# Patient Record
Sex: Male | Born: 1950
Health system: Southern US, Community
[De-identification: ages and names within clinical notes are randomized; demographics above are authoritative.]

## PROBLEM LIST (undated history)

## (undated) DIAGNOSIS — M1990 Unspecified osteoarthritis, unspecified site: Secondary | ICD-10-CM

## (undated) DIAGNOSIS — I739 Peripheral vascular disease, unspecified: Secondary | ICD-10-CM

## (undated) DIAGNOSIS — R569 Unspecified convulsions: Secondary | ICD-10-CM

## (undated) DIAGNOSIS — I10 Essential (primary) hypertension: Secondary | ICD-10-CM

## (undated) DIAGNOSIS — B1920 Unspecified viral hepatitis C without hepatic coma: Secondary | ICD-10-CM

## (undated) DIAGNOSIS — E7800 Pure hypercholesterolemia, unspecified: Secondary | ICD-10-CM

## (undated) HISTORY — DX: Unspecified viral hepatitis C without hepatic coma: B19.20

## (undated) HISTORY — DX: Peripheral vascular disease, unspecified: I73.9

---

## 1997-12-01 ENCOUNTER — Emergency Department (HOSPITAL_COMMUNITY): Admission: EM | Admit: 1997-12-01 | Discharge: 1997-12-01 | Payer: Self-pay | Admitting: Emergency Medicine

## 1997-12-10 ENCOUNTER — Emergency Department (HOSPITAL_COMMUNITY): Admission: EM | Admit: 1997-12-10 | Discharge: 1997-12-10 | Payer: Self-pay | Admitting: Emergency Medicine

## 1998-05-07 ENCOUNTER — Emergency Department (HOSPITAL_COMMUNITY): Admission: EM | Admit: 1998-05-07 | Discharge: 1998-05-07 | Payer: Self-pay | Admitting: Emergency Medicine

## 1998-09-05 ENCOUNTER — Encounter: Payer: Self-pay | Admitting: Emergency Medicine

## 1998-09-05 ENCOUNTER — Emergency Department (HOSPITAL_COMMUNITY): Admission: EM | Admit: 1998-09-05 | Discharge: 1998-09-05 | Payer: Self-pay | Admitting: Emergency Medicine

## 1998-09-08 ENCOUNTER — Encounter: Payer: Self-pay | Admitting: Emergency Medicine

## 1998-09-08 ENCOUNTER — Emergency Department (HOSPITAL_COMMUNITY): Admission: EM | Admit: 1998-09-08 | Discharge: 1998-09-08 | Payer: Self-pay | Admitting: Emergency Medicine

## 1998-09-17 ENCOUNTER — Emergency Department (HOSPITAL_COMMUNITY): Admission: EM | Admit: 1998-09-17 | Discharge: 1998-09-17 | Payer: Self-pay | Admitting: Emergency Medicine

## 1998-09-20 ENCOUNTER — Emergency Department (HOSPITAL_COMMUNITY): Admission: EM | Admit: 1998-09-20 | Discharge: 1998-09-20 | Payer: Self-pay | Admitting: Emergency Medicine

## 1999-01-06 ENCOUNTER — Emergency Department (HOSPITAL_COMMUNITY): Admission: EM | Admit: 1999-01-06 | Discharge: 1999-01-06 | Payer: Self-pay | Admitting: Emergency Medicine

## 1999-12-08 ENCOUNTER — Inpatient Hospital Stay (HOSPITAL_COMMUNITY): Admission: EM | Admit: 1999-12-08 | Discharge: 1999-12-12 | Payer: Self-pay | Admitting: Emergency Medicine

## 1999-12-08 ENCOUNTER — Encounter: Payer: Self-pay | Admitting: Emergency Medicine

## 2000-01-12 ENCOUNTER — Encounter: Payer: Self-pay | Admitting: Emergency Medicine

## 2000-01-12 ENCOUNTER — Observation Stay (HOSPITAL_COMMUNITY): Admission: EM | Admit: 2000-01-12 | Discharge: 2000-01-13 | Payer: Self-pay | Admitting: Emergency Medicine

## 2000-04-12 ENCOUNTER — Emergency Department (HOSPITAL_COMMUNITY): Admission: EM | Admit: 2000-04-12 | Discharge: 2000-04-12 | Payer: Self-pay | Admitting: Emergency Medicine

## 2000-10-20 ENCOUNTER — Emergency Department (HOSPITAL_COMMUNITY): Admission: EM | Admit: 2000-10-20 | Discharge: 2000-10-21 | Payer: Self-pay

## 2001-02-15 ENCOUNTER — Emergency Department (HOSPITAL_COMMUNITY): Admission: EM | Admit: 2001-02-15 | Discharge: 2001-02-15 | Payer: Self-pay

## 2001-09-27 ENCOUNTER — Emergency Department (HOSPITAL_COMMUNITY): Admission: EM | Admit: 2001-09-27 | Discharge: 2001-09-27 | Payer: Self-pay | Admitting: Emergency Medicine

## 2001-12-08 ENCOUNTER — Encounter: Payer: Self-pay | Admitting: Family Medicine

## 2001-12-08 ENCOUNTER — Ambulatory Visit (HOSPITAL_COMMUNITY): Admission: RE | Admit: 2001-12-08 | Discharge: 2001-12-08 | Payer: Self-pay | Admitting: Family Medicine

## 2002-01-04 ENCOUNTER — Encounter: Payer: Self-pay | Admitting: Internal Medicine

## 2002-01-04 ENCOUNTER — Ambulatory Visit (HOSPITAL_COMMUNITY): Admission: RE | Admit: 2002-01-04 | Discharge: 2002-01-04 | Payer: Self-pay | Admitting: Internal Medicine

## 2002-08-03 ENCOUNTER — Ambulatory Visit (HOSPITAL_COMMUNITY): Admission: RE | Admit: 2002-08-03 | Discharge: 2002-08-03 | Payer: Self-pay | Admitting: Internal Medicine

## 2002-08-03 ENCOUNTER — Encounter: Payer: Self-pay | Admitting: Internal Medicine

## 2003-04-19 ENCOUNTER — Ambulatory Visit: Admission: RE | Admit: 2003-04-19 | Discharge: 2003-04-19 | Payer: Self-pay

## 2003-12-16 ENCOUNTER — Ambulatory Visit: Payer: Self-pay | Admitting: *Deleted

## 2004-01-06 ENCOUNTER — Ambulatory Visit: Payer: Self-pay | Admitting: Family Medicine

## 2004-04-04 ENCOUNTER — Ambulatory Visit: Payer: Self-pay | Admitting: Family Medicine

## 2004-04-12 ENCOUNTER — Ambulatory Visit: Payer: Self-pay | Admitting: Family Medicine

## 2004-05-07 ENCOUNTER — Ambulatory Visit: Payer: Self-pay | Admitting: Family Medicine

## 2004-07-05 ENCOUNTER — Ambulatory Visit: Payer: Self-pay | Admitting: Internal Medicine

## 2004-07-17 ENCOUNTER — Ambulatory Visit: Payer: Self-pay | Admitting: Family Medicine

## 2004-08-31 ENCOUNTER — Ambulatory Visit: Payer: Self-pay | Admitting: Family Medicine

## 2004-10-08 ENCOUNTER — Ambulatory Visit: Payer: Self-pay | Admitting: Internal Medicine

## 2004-10-15 ENCOUNTER — Ambulatory Visit: Payer: Self-pay | Admitting: Internal Medicine

## 2004-10-29 ENCOUNTER — Ambulatory Visit: Payer: Self-pay | Admitting: Internal Medicine

## 2004-11-02 ENCOUNTER — Ambulatory Visit: Payer: Self-pay | Admitting: Family Medicine

## 2004-12-21 ENCOUNTER — Ambulatory Visit: Payer: Self-pay | Admitting: Family Medicine

## 2005-01-10 ENCOUNTER — Ambulatory Visit: Payer: Self-pay | Admitting: Family Medicine

## 2005-01-21 ENCOUNTER — Ambulatory Visit: Payer: Self-pay | Admitting: Family Medicine

## 2005-02-05 ENCOUNTER — Ambulatory Visit: Payer: Self-pay | Admitting: Family Medicine

## 2005-02-18 ENCOUNTER — Ambulatory Visit (HOSPITAL_COMMUNITY): Admission: RE | Admit: 2005-02-18 | Discharge: 2005-02-18 | Payer: Self-pay | Admitting: Vascular Surgery

## 2005-02-21 ENCOUNTER — Ambulatory Visit: Payer: Self-pay | Admitting: Family Medicine

## 2005-10-08 ENCOUNTER — Ambulatory Visit: Payer: Self-pay | Admitting: Family Medicine

## 2005-11-26 ENCOUNTER — Ambulatory Visit: Payer: Self-pay | Admitting: Family Medicine

## 2005-12-24 ENCOUNTER — Ambulatory Visit: Payer: Self-pay | Admitting: Family Medicine

## 2006-01-30 ENCOUNTER — Ambulatory Visit: Payer: Self-pay | Admitting: Family Medicine

## 2006-02-10 ENCOUNTER — Ambulatory Visit: Payer: Self-pay | Admitting: Family Medicine

## 2006-02-24 ENCOUNTER — Ambulatory Visit: Payer: Self-pay | Admitting: Family Medicine

## 2006-02-26 ENCOUNTER — Ambulatory Visit: Payer: Self-pay | Admitting: Family Medicine

## 2006-03-06 ENCOUNTER — Ambulatory Visit: Payer: Self-pay | Admitting: Family Medicine

## 2006-04-07 ENCOUNTER — Ambulatory Visit: Payer: Self-pay | Admitting: Family Medicine

## 2006-04-30 ENCOUNTER — Ambulatory Visit: Payer: Self-pay | Admitting: Family Medicine

## 2006-04-30 ENCOUNTER — Emergency Department (HOSPITAL_COMMUNITY): Admission: EM | Admit: 2006-04-30 | Discharge: 2006-04-30 | Payer: Self-pay | Admitting: Emergency Medicine

## 2006-05-05 ENCOUNTER — Ambulatory Visit: Payer: Self-pay | Admitting: Cardiology

## 2006-09-09 ENCOUNTER — Ambulatory Visit: Payer: Self-pay | Admitting: Internal Medicine

## 2006-09-10 DIAGNOSIS — F329 Major depressive disorder, single episode, unspecified: Secondary | ICD-10-CM | POA: Insufficient documentation

## 2006-09-10 DIAGNOSIS — I739 Peripheral vascular disease, unspecified: Secondary | ICD-10-CM | POA: Insufficient documentation

## 2006-09-10 DIAGNOSIS — R569 Unspecified convulsions: Secondary | ICD-10-CM | POA: Insufficient documentation

## 2006-09-10 DIAGNOSIS — I10 Essential (primary) hypertension: Secondary | ICD-10-CM | POA: Insufficient documentation

## 2006-12-16 ENCOUNTER — Ambulatory Visit: Payer: Self-pay | Admitting: Vascular Surgery

## 2007-02-20 ENCOUNTER — Ambulatory Visit: Payer: Self-pay | Admitting: Internal Medicine

## 2007-02-20 LAB — CONVERTED CEMR LAB
ALT: 13 units/L (ref 0–53)
AST: 18 units/L (ref 0–37)
Albumin: 4.3 g/dL (ref 3.5–5.2)
Alkaline Phosphatase: 118 units/L — ABNORMAL HIGH (ref 39–117)
BUN: 11 mg/dL (ref 6–23)
Basophils Absolute: 0 10*3/uL (ref 0.0–0.1)
Basophils Relative: 1 % (ref 0–1)
CO2: 26 meq/L (ref 19–32)
Calcium: 9.4 mg/dL (ref 8.4–10.5)
Chloride: 103 meq/L (ref 96–112)
Cholesterol: 189 mg/dL (ref 0–200)
Creatinine, Ser: 0.92 mg/dL (ref 0.40–1.50)
Eosinophils Absolute: 0.2 10*3/uL (ref 0.2–0.7)
Eosinophils Relative: 3 % (ref 0–5)
Glucose, Bld: 82 mg/dL (ref 70–99)
HCT: 45.1 % (ref 39.0–52.0)
HDL: 67 mg/dL (ref >39)
Hemoglobin: 15.2 g/dL (ref 13.0–17.0)
LDL Cholesterol: 110 mg/dL — ABNORMAL HIGH (ref 0–99)
Lymphocytes Relative: 46 % (ref 12–46)
Lymphs Abs: 2.7 10*3/uL (ref 0.7–4.0)
MCHC: 33.7 g/dL (ref 30.0–36.0)
MCV: 94.7 fL (ref 78.0–100.0)
Monocytes Absolute: 0.4 10*3/uL (ref 0.1–1.0)
Monocytes Relative: 7 % (ref 3–12)
Neutro Abs: 2.7 10*3/uL (ref 1.7–7.7)
Neutrophils Relative %: 45 % (ref 43–77)
Phenytoin Lvl: 18 ug/mL (ref 10.0–20.0)
Platelets: 211 10*3/uL (ref 150–400)
Potassium: 4.2 meq/L (ref 3.5–5.3)
RBC: 4.76 M/uL (ref 4.22–5.81)
RDW: 13.8 % (ref 11.5–15.5)
Sodium: 139 meq/L (ref 135–145)
Total Bilirubin: 0.5 mg/dL (ref 0.3–1.2)
Total CHOL/HDL Ratio: 2.8
Total Protein: 7.4 g/dL (ref 6.0–8.3)
Triglycerides: 58 mg/dL (ref <150)
VLDL: 12 mg/dL (ref 0–40)
WBC: 6 10*3/uL (ref 4.0–10.5)

## 2007-05-11 ENCOUNTER — Ambulatory Visit: Payer: Self-pay | Admitting: Internal Medicine

## 2007-05-11 ENCOUNTER — Encounter (INDEPENDENT_AMBULATORY_CARE_PROVIDER_SITE_OTHER): Payer: Self-pay | Admitting: Family Medicine

## 2007-05-11 LAB — CONVERTED CEMR LAB
ALT: 13 units/L (ref 0–53)
AST: 16 units/L (ref 0–37)
Albumin: 4.3 g/dL (ref 3.5–5.2)
Alkaline Phosphatase: 127 units/L — ABNORMAL HIGH (ref 39–117)
BUN: 9 mg/dL (ref 6–23)
CO2: 25 meq/L (ref 19–32)
Calcium: 9.2 mg/dL (ref 8.4–10.5)
Chloride: 105 meq/L (ref 96–112)
Creatinine, Ser: 0.9 mg/dL (ref 0.40–1.50)
Glucose, Bld: 75 mg/dL (ref 70–99)
Phenytoin Lvl: 11.6 ug/mL (ref 10.0–20.0)
Potassium: 4 meq/L (ref 3.5–5.3)
Sodium: 138 meq/L (ref 135–145)
Total Bilirubin: 0.4 mg/dL (ref 0.3–1.2)
Total Protein: 7.2 g/dL (ref 6.0–8.3)

## 2007-09-21 ENCOUNTER — Ambulatory Visit: Payer: Self-pay | Admitting: Internal Medicine

## 2007-11-25 ENCOUNTER — Ambulatory Visit: Payer: Self-pay | Admitting: Internal Medicine

## 2007-11-25 ENCOUNTER — Encounter (INDEPENDENT_AMBULATORY_CARE_PROVIDER_SITE_OTHER): Payer: Self-pay | Admitting: Family Medicine

## 2007-11-25 LAB — CONVERTED CEMR LAB
ALT: 16 units/L (ref 0–53)
AST: 15 units/L (ref 0–37)
Albumin: 4 g/dL (ref 3.5–5.2)
Alkaline Phosphatase: 76 units/L (ref 39–117)
BUN: 13 mg/dL (ref 6–23)
Basophils Absolute: 0 10*3/uL (ref 0.0–0.1)
Basophils Relative: 1 % (ref 0–1)
CO2: 27 meq/L (ref 19–32)
Calcium: 8.6 mg/dL (ref 8.4–10.5)
Chloride: 104 meq/L (ref 96–112)
Cholesterol: 158 mg/dL (ref 0–200)
Creatinine, Ser: 1.03 mg/dL (ref 0.40–1.50)
Eosinophils Absolute: 0.2 10*3/uL (ref 0.0–0.7)
Eosinophils Relative: 3 % (ref 0–5)
Glucose, Bld: 99 mg/dL (ref 70–99)
HCT: 43.4 % (ref 39.0–52.0)
HDL: 60 mg/dL (ref >39)
Hemoglobin: 14.5 g/dL (ref 13.0–17.0)
LDL Cholesterol: 87 mg/dL (ref 0–99)
Lymphocytes Relative: 44 % (ref 12–46)
Lymphs Abs: 2.9 10*3/uL (ref 0.7–4.0)
MCHC: 33.4 g/dL (ref 30.0–36.0)
MCV: 95.4 fL (ref 78.0–100.0)
Monocytes Absolute: 0.5 10*3/uL (ref 0.1–1.0)
Monocytes Relative: 7 % (ref 3–12)
Neutro Abs: 3.1 10*3/uL (ref 1.7–7.7)
Neutrophils Relative %: 47 % (ref 43–77)
PSA: 0.2 ng/mL (ref 0.10–4.00)
Phenytoin Lvl: 19.8 ug/mL (ref 10.0–20.0)
Platelets: 213 10*3/uL (ref 150–400)
Potassium: 4.6 meq/L (ref 3.5–5.3)
RBC: 4.55 M/uL (ref 4.22–5.81)
RDW: 14 % (ref 11.5–15.5)
Sodium: 140 meq/L (ref 135–145)
Total Bilirubin: 0.3 mg/dL (ref 0.3–1.2)
Total CHOL/HDL Ratio: 2.6
Total Protein: 6.7 g/dL (ref 6.0–8.3)
Triglycerides: 54 mg/dL (ref <150)
VLDL: 11 mg/dL (ref 0–40)
WBC: 6.7 10*3/uL (ref 4.0–10.5)

## 2008-02-22 ENCOUNTER — Ambulatory Visit: Payer: Self-pay | Admitting: Internal Medicine

## 2008-10-25 ENCOUNTER — Ambulatory Visit: Payer: Self-pay | Admitting: Internal Medicine

## 2008-10-25 ENCOUNTER — Encounter (INDEPENDENT_AMBULATORY_CARE_PROVIDER_SITE_OTHER): Payer: Self-pay | Admitting: Adult Health

## 2008-10-25 LAB — CONVERTED CEMR LAB
ALT: 21 units/L (ref 0–53)
AST: 20 units/L (ref 0–37)
Albumin: 4.2 g/dL (ref 3.5–5.2)
Alkaline Phosphatase: 84 units/L (ref 39–117)
BUN: 11 mg/dL (ref 6–23)
Basophils Absolute: 0.1 10*3/uL (ref 0.0–0.1)
Basophils Relative: 1 % (ref 0–1)
CO2: 26 meq/L (ref 19–32)
Calcium: 9 mg/dL (ref 8.4–10.5)
Chloride: 102 meq/L (ref 96–112)
Creatinine, Ser: 0.98 mg/dL (ref 0.40–1.50)
Eosinophils Absolute: 0.2 10*3/uL (ref 0.0–0.7)
Eosinophils Relative: 2 % (ref 0–5)
Glucose, Bld: 96 mg/dL (ref 70–99)
HCT: 42.6 % (ref 39.0–52.0)
Hemoglobin: 14.7 g/dL (ref 13.0–17.0)
Lymphocytes Relative: 43 % (ref 12–46)
Lymphs Abs: 3.5 10*3/uL (ref 0.7–4.0)
MCHC: 34.5 g/dL (ref 30.0–36.0)
MCV: 92.8 fL (ref 78.0–100.0)
Microalb, Ur: 0.57 mg/dL (ref 0.00–1.89)
Monocytes Absolute: 0.6 10*3/uL (ref 0.1–1.0)
Monocytes Relative: 7 % (ref 3–12)
Neutro Abs: 3.8 10*3/uL (ref 1.7–7.7)
Neutrophils Relative %: 48 % (ref 43–77)
Phenytoin Lvl: 18.2 ug/mL (ref 10.0–20.0)
Platelets: 202 10*3/uL (ref 150–400)
Potassium: 4.2 meq/L (ref 3.5–5.3)
RBC: 4.59 M/uL (ref 4.22–5.81)
RDW: 13.6 % (ref 11.5–15.5)
Sodium: 139 meq/L (ref 135–145)
Total Bilirubin: 0.5 mg/dL (ref 0.3–1.2)
Total Protein: 7 g/dL (ref 6.0–8.3)
WBC: 8.1 10*3/uL (ref 4.0–10.5)

## 2009-04-24 ENCOUNTER — Ambulatory Visit: Payer: Self-pay | Admitting: Internal Medicine

## 2009-04-24 ENCOUNTER — Encounter (INDEPENDENT_AMBULATORY_CARE_PROVIDER_SITE_OTHER): Payer: Self-pay | Admitting: Adult Health

## 2009-04-24 LAB — CONVERTED CEMR LAB
ALT: 20 units/L (ref 0–53)
AST: 25 units/L (ref 0–37)
Albumin: 4 g/dL (ref 3.5–5.2)
Alkaline Phosphatase: 74 units/L (ref 39–117)
BUN: 10 mg/dL (ref 6–23)
Basophils Absolute: 0 10*3/uL (ref 0.0–0.1)
Basophils Relative: 0 % (ref 0–1)
CO2: 18 meq/L — ABNORMAL LOW (ref 19–32)
Calcium: 9.4 mg/dL (ref 8.4–10.5)
Chloride: 103 meq/L (ref 96–112)
Creatinine, Ser: 0.92 mg/dL (ref 0.40–1.50)
Eosinophils Absolute: 0.1 10*3/uL (ref 0.0–0.7)
Eosinophils Relative: 2 % (ref 0–5)
Glucose, Bld: 91 mg/dL (ref 70–99)
HCT: 42.7 % (ref 39.0–52.0)
Hemoglobin: 14.7 g/dL (ref 13.0–17.0)
Lymphocytes Relative: 40 % (ref 12–46)
Lymphs Abs: 2.9 10*3/uL (ref 0.7–4.0)
MCHC: 34.4 g/dL (ref 30.0–36.0)
MCV: 93 fL (ref 78.0–100.0)
Monocytes Absolute: 0.5 10*3/uL (ref 0.1–1.0)
Monocytes Relative: 7 % (ref 3–12)
Neutro Abs: 3.6 10*3/uL (ref 1.7–7.7)
Neutrophils Relative %: 51 % (ref 43–77)
Phenytoin Lvl: 16.6 ug/mL (ref 10.0–20.0)
Platelets: 228 10*3/uL (ref 150–400)
Potassium: 4.1 meq/L (ref 3.5–5.3)
RBC: 4.59 M/uL (ref 4.22–5.81)
RDW: 13.5 % (ref 11.5–15.5)
Sodium: 135 meq/L (ref 135–145)
Total Bilirubin: 0.5 mg/dL (ref 0.3–1.2)
Total Protein: 6.9 g/dL (ref 6.0–8.3)
WBC: 7.1 10*3/uL (ref 4.0–10.5)

## 2009-07-11 ENCOUNTER — Emergency Department (HOSPITAL_COMMUNITY): Admission: EM | Admit: 2009-07-11 | Discharge: 2009-07-12 | Payer: Self-pay | Admitting: Emergency Medicine

## 2009-07-21 ENCOUNTER — Ambulatory Visit: Payer: Self-pay | Admitting: Internal Medicine

## 2009-07-21 ENCOUNTER — Encounter (INDEPENDENT_AMBULATORY_CARE_PROVIDER_SITE_OTHER): Payer: Self-pay | Admitting: Adult Health

## 2009-07-21 LAB — CONVERTED CEMR LAB
ALT: 22 units/L (ref 0–53)
AST: 23 units/L (ref 0–37)
Albumin: 4.1 g/dL (ref 3.5–5.2)
Alkaline Phosphatase: 68 units/L (ref 39–117)
BUN: 13 mg/dL (ref 6–23)
CO2: 22 meq/L (ref 19–32)
Calcium: 9.4 mg/dL (ref 8.4–10.5)
Chloride: 105 meq/L (ref 96–112)
Cholesterol: 163 mg/dL (ref 0–200)
Creatinine, Ser: 1 mg/dL (ref 0.40–1.50)
Glucose, Bld: 107 mg/dL — ABNORMAL HIGH (ref 70–99)
HDL: 67 mg/dL (ref >39)
LDL Cholesterol: 85 mg/dL (ref 0–99)
PSA: 0.63 ng/mL (ref 0.10–4.00)
Potassium: 4.1 meq/L (ref 3.5–5.3)
Sodium: 140 meq/L (ref 135–145)
TSH: 1.129 microintl units/mL (ref 0.350–4.500)
Total Bilirubin: 0.4 mg/dL (ref 0.3–1.2)
Total CHOL/HDL Ratio: 2.4
Total Protein: 6.6 g/dL (ref 6.0–8.3)
Triglycerides: 57 mg/dL (ref <150)
VLDL: 11 mg/dL (ref 0–40)
Vit D, 25-Hydroxy: 57 ng/mL (ref 30–89)
Vitamin B-12: 378 pg/mL (ref 211–911)

## 2010-02-07 ENCOUNTER — Encounter (INDEPENDENT_AMBULATORY_CARE_PROVIDER_SITE_OTHER): Payer: Self-pay | Admitting: *Deleted

## 2010-02-07 LAB — CONVERTED CEMR LAB
ALT: 30 units/L (ref 0–53)
AST: 31 units/L (ref 0–37)
Albumin: 4 g/dL (ref 3.5–5.2)
Alkaline Phosphatase: 84 units/L (ref 39–117)
BUN: 9 mg/dL (ref 6–23)
CO2: 28 meq/L (ref 19–32)
Calcium: 8.9 mg/dL (ref 8.4–10.5)
Chloride: 104 meq/L (ref 96–112)
Creatinine, Ser: 0.83 mg/dL (ref 0.40–1.50)
Glucose, Bld: 89 mg/dL (ref 70–99)
HCT: 44.7 % (ref 39.0–52.0)
Hemoglobin: 15 g/dL (ref 13.0–17.0)
MCHC: 33.6 g/dL (ref 30.0–36.0)
MCV: 93.5 fL (ref 78.0–100.0)
Microalb, Ur: 0.5 mg/dL (ref 0.00–1.89)
Phenytoin Lvl: 14.4 ug/mL (ref 10.0–20.0)
Platelets: 171 10*3/uL (ref 150–400)
Potassium: 4.3 meq/L (ref 3.5–5.3)
RBC: 4.78 M/uL (ref 4.22–5.81)
RDW: 13.3 % (ref 11.5–15.5)
Sodium: 142 meq/L (ref 135–145)
Total Bilirubin: 0.4 mg/dL (ref 0.3–1.2)
Total Protein: 6.8 g/dL (ref 6.0–8.3)
WBC: 5.3 10*3/uL (ref 4.0–10.5)

## 2010-06-05 LAB — URINALYSIS, ROUTINE W REFLEX MICROSCOPIC
Glucose, UA: NEGATIVE mg/dL
Hgb urine dipstick: NEGATIVE
Leukocytes, UA: NEGATIVE
Nitrite: POSITIVE — AB
Protein, ur: NEGATIVE mg/dL
Specific Gravity, Urine: 1.032 — ABNORMAL HIGH (ref 1.005–1.030)
Urobilinogen, UA: 1 mg/dL (ref 0.0–1.0)
pH: 5.5 (ref 5.0–8.0)

## 2010-06-05 LAB — DIFFERENTIAL
Basophils Absolute: 0 10*3/uL (ref 0.0–0.1)
Basophils Relative: 1 % (ref 0–1)
Eosinophils Absolute: 0.2 10*3/uL (ref 0.0–0.7)
Eosinophils Relative: 2 % (ref 0–5)
Lymphocytes Relative: 38 % (ref 12–46)
Lymphs Abs: 3.8 10*3/uL (ref 0.7–4.0)
Monocytes Absolute: 0.9 10*3/uL (ref 0.1–1.0)
Monocytes Relative: 10 % (ref 3–12)
Neutro Abs: 4.9 10*3/uL (ref 1.7–7.7)
Neutrophils Relative %: 50 % (ref 43–77)

## 2010-06-05 LAB — COMPREHENSIVE METABOLIC PANEL
ALT: 27 U/L (ref 0–53)
AST: 33 U/L (ref 0–37)
Albumin: 4.3 g/dL (ref 3.5–5.2)
Alkaline Phosphatase: 69 U/L (ref 39–117)
BUN: 10 mg/dL (ref 6–23)
CO2: 31 mEq/L (ref 19–32)
Calcium: 9 mg/dL (ref 8.4–10.5)
Chloride: 101 mEq/L (ref 96–112)
Creatinine, Ser: 1.04 mg/dL (ref 0.4–1.5)
GFR calc Af Amer: >60 mL/min (ref >60)
GFR calc non Af Amer: >60 mL/min (ref >60)
Glucose, Bld: 109 mg/dL — ABNORMAL HIGH (ref 70–99)
Potassium: 3.6 mEq/L (ref 3.5–5.1)
Sodium: 137 mEq/L (ref 135–145)
Total Bilirubin: 1.3 mg/dL — ABNORMAL HIGH (ref 0.3–1.2)
Total Protein: 7.4 g/dL (ref 6.0–8.3)

## 2010-06-05 LAB — URINE MICROSCOPIC-ADD ON

## 2010-06-05 LAB — CBC
HCT: 41.4 % (ref 39.0–52.0)
Hemoglobin: 13.9 g/dL (ref 13.0–17.0)
MCHC: 33.4 g/dL (ref 30.0–36.0)
MCV: 97.1 fL (ref 78.0–100.0)
Platelets: 190 10*3/uL (ref 150–400)
RBC: 4.27 MIL/uL (ref 4.22–5.81)
RDW: 13.7 % (ref 11.5–15.5)
WBC: 9.9 10*3/uL (ref 4.0–10.5)

## 2010-06-05 LAB — ETHANOL: Alcohol, Ethyl (B): <5 mg/dL (ref 0–10)

## 2010-06-05 LAB — RAPID URINE DRUG SCREEN, HOSP PERFORMED
Amphetamines: NOT DETECTED
Barbiturates: NOT DETECTED
Benzodiazepines: NOT DETECTED
Cocaine: NOT DETECTED
Opiates: NOT DETECTED
Tetrahydrocannabinol: NOT DETECTED

## 2010-06-05 LAB — PHENYTOIN LEVEL, TOTAL: Phenytoin Lvl: 11.2 ug/mL (ref 10.0–20.0)

## 2010-08-03 NOTE — Discharge Summary (Signed)
[place]. [facility]  Patient:    Jeffrey Serrano, Jeffrey Serrano                       MRN: [id number] Adm. Date:  [id number] Disc. Date: [id number] Attending:  Arsenio Loader Dictator:   Duncan Dull, M.D.                           Discharge Summary  NO DICTATION. DD:  12/21/99 TD:  12/23/99 Job: [id number]

## 2010-08-03 NOTE — Discharge Summary (Signed)
[place]. [facility]  Patient:    Jeffrey Serrano, Jeffrey Serrano                       MRN: [id number] Adm. Date:  [id number] Disc. Date: 01/13/00 Attending:  Madaline Serrano Dictator:   Jeffrey Serrano, M.D.                           Discharge Summary  DATE OF BIRTH:  [birth year]  DISCHARGE DIAGNOSES: 1. Alcohol intoxication. 2. Ruled out for myocardial infarction. 3. Hypertension. 4. Depression.  DISCHARGE MEDICATIONS: 1. Multivitamin with B12 and folate one p.o. q.d. 2. Metoprolol 12.5 mg one p.o. b.i.d.  PROCEDURES:  None.  CONSULTATIONS:  None.  ADMISSION HISTORY:  This 60 year old African-American gentleman was found lying at the side of the street in a no parking zone by the police.  When roused he complained of chest pain and was brought to [facility].  When examined he had decreased mental status and a very strong odor of alcohol. Initially on arrival to the ED, he had been combative and threatening and received 2.5 mg of droperidol.  By my exam he was unable to provide any history.  He reportedly had complained of chest pain, but during my exam denied chest pain, could no further characterize his earlier pain and did not remember having had any pain.  He was disoriented and confused.  His wife was contacted in an attempt to clarify his history, but she was unable to be reached.  He was previously admitted from September 22 to December 12, 1999. Had been brought to the ED by his wife at that time for a generalized seizure found to be secondary to alcohol withdrawal.  At that time he was noted to have ventricular bigeminy by EKG.  His cardiac workup included a 2-D echo showing and injection fraction of 55-65%, mild concentric LVH, and no regional wall motion abnormalities.  He was discharged on a multivitamin and metoprolol at that time, although I am unclear whether he is still taking these.  PAST MEDICAL HISTORY:  Significant for alcohol abuse  with a history of prior withdrawal seizures, hypertension, and depression.  FAMILY HISTORY:  Unobtainable.  SOCIAL HISTORY:  Alcohol abuse since he lost his job in March 2001.  He primarily drinks gin.  History of tobacco abuse, unable to characterize the amount.  REVIEW OF SYSTEMS:  Unobtainable.  ALLERGIES:  No known drug allergies.  OUTPATIENT MEDICATIONS: 1. Multivitamin with B12 and folate one p.o. q.d. 2. Metoprolol 12.5 mg one p.o. b.i.d.  PHYSICAL EXAMINATION ON ADMISSION:  VITAL SIGNS:  Temperature 97.1, heart rate 85, respiratory rate 22, blood pressure 120/93, oxygen saturation 100% on room air.  GENERAL APPEARANCE:  This was an asleep, but arousable male, agitated, smelling strongly of alcohol with poor hygiene.  HEENT:  Normocephalic, atraumatic.  Extraocular movements intact.  Pupils equal, round, reactive, light accommodating, dilated to 4 mm, but responsive. Oropharynx without lesions.  Mucosa moist, poor dentition.  NECK:  Supple with no lymphadenopathy and no JVD.  CHEST:  Clear to auscultation.  CARDIOVASCULAR:  Regular rate and rhythm, no murmur, rub, or gallop.  ABDOMEN:  Soft, nontender, nondistended with positive bowel sounds.  EXTREMITIES:  Pulse 2+, no clubbing, cyanosis, or edema.  NEUROLOGIC:  Sensory motor was grossly intact.  He was moving all four extremities.  Formal exam was impossible due to the patient not cooperating.  RECTAL:  Deferred as the patient had to be restrained.  ADMISSION LABORATORY AND X-RAY DATA:  Sodium of 140, potassium 3.3, chloride 108, bicarb of 24, BUN 8, creatinine 0.8, glucose of 94, calcium 8.2, protein 5.9, albumin 3.2, AST 23, ALT 23, alkaline phosphatase 89, total bilirubin 0.6.  White blood cell count 10.3, hemoglobin 14.0, hematocrit 41.5, platelets 167, MCV 100.7, RDW 14.3.  PTT 29, PT 12.4, INR 0.9.  Urinalysis was normal. Urine drug screen negative for drugs of abuse, acetaminophen, and  salicylates. Alcohol level was 418.  First set of cardiac enzymes:  CK total 115, MB 2.2, and troponin 0.02.  Dilantin level less than 2.5.  Chest x-ray showed no acute disease.  EKG initially was in sinus rhythm with bigeminy and quadrigeminy.  A repeat EKG approximately one hour later showed P pulmonale, but no ST-T segment changes.  HOSPITAL COURSE: #1 - CHEST PAIN:  Jeffrey Serrano was admitted to telemetry for serial enzymes and EKGs to rule him out for MI.  He was having bigeminy on EKG, but this gradually resolved over the course of the night.  He had a normal echo one month ago and at that time for risk reduction was offered tobacco cessation which he refused.  During the night he became increasingly agitated and combative toward the nurses.  He refused to have any further blood draws, and so repeat cardiac enzymes could not be assessed.  This morning when I examined him and attempted to discuss risk stratification, he states that he desires to leave the hospital, is unwilling to have any more blood collected or EKGs taken, denies having had any chest pain or dyspnea overnight, and states that he slept well.  At this time he appears to be stable.  I do not believe that he has in fact had an infarct, and while I would prefer to complete his cardiac workup he is uncooperative at this time, and so at his request he will be discharged home this morning.  #2 - MENTAL STATUS CHANGES:  This was likely due to acute alcohol intoxication, given his level of 418.  There were no metabolic disturbances, no evidence for ingestion, and the urine drug screen was negative.  Overnight his confusion improved, and he is less combative this morning, although still uncooperative and requesting to go home.  I feel that at this time he does have capacity to make that decision.  He is oriented and showing appropriate judgment and insight, so will be discharged home at his request.  #3 - ALCOHOL ABUSE:   According to Jeffrey Serrano wife in a note from previous admission, he has been drinking heavily since losing his job earlier this  years.  He has repeatedly been offered treatment programs for alcohol abuse and has refused them.  I discussed with him again this morning options available to him including either inpatient or outpatient treatment at [facility] and Alcoholics Anonymous meetings.  He accepted the information that I provided to him, but states that he does not intend to quit drinking at this time.  Given his lack of interest, I do not think that a formal social work consult to reiterate these options to him is going to achieve anything further at this time.  He received thiamine and folate in his IV fluids overnight, and will be continued on both supplements following discharge.  #4 - HYPERTENSION:  Stable during this hospitalization.  Will continue him on metoprolol.  #5 - DEPRESSION:  Mr. Kicklighter declined to  discuss this further with me.  His depression may be related to having lost his job earlier in the year, and alcohol could be a means of self-medicating.  Treatment for his depression would be appropriate, but only in the setting of a dual diagnosis treatment program where his substance abuse issues would also be addressed, and he is refusing that at this time.  DISCHARGE LABORATORIES:  Second set of cardiac enzymes:  CK total of 106, MB fragment 1.6, and relative index of 1.5.  DISCHARGE FOLLOWUP:  Follow-up will be at the medicine ambulatory care clinic. I provided him with the telephone number to call for follow-up.  Things to check on at that visit include assessing whether he has continued to have chest pain at home and whether he is interested in an alcohol abuse treatment program. DD:  01/13/00 TD:  01/13/00 Job: [id number]

## 2010-08-03 NOTE — Assessment & Plan Note (Signed)
[facility]                            CARDIOLOGY OFFICE NOTE   Jeffrey Serrano, Jeffrey Serrano                       MRN:          [id number]  DATE:05/05/2006                            DOB:          1950-06-30    Jeffrey Serrano is here for cardiology followup.  He is followed at  [facility].  It is my understanding that he was at the emergency room  at [facility] recently.  I have a significant amount of printout data  from that visit.  It is my understanding that he just felt weak in  general and that his potassium was low.  The patient tells me that he  does have some pain in his legs when he walks.  However, he describes  discomfort on the top of his feet.  There is question that he may have  had a vascular procedure to his leg in the past, but I do not have his  data.  He has a history of seizures for which he is on Dilantin.  Exact  etiology and evaluation is not available to me.  He is not having any  chest pain.  He does not have marked shortness of breath.  His EKG in  the emergency room did show one PVC.   PAST MEDICAL HISTORY:  Allergies:  No known drug allergies.   Medications:  1. Sulindac 200 b.i.d.  2. Dilantin 100 b.i.d.  3. Pentoxifylline 400 t.i.d.  4. Lexapro 10.  5. Sular 30.  6. Lisinopril 10.   Other medical problems:  See the list below.   SOCIAL HISTORY:  The patient is separated.  He does not smoke.  The  patient does some custodial work.   FAMILY HISTORY:  There is no strong family history of coronary disease.   REVIEW OF SYSTEMS:  He has no major complaints today.  He says that he  is here because he was told to be here.   PHYSICAL EXAMINATION:  VITAL SIGNS:  Weight is 155.  Blood pressure is  150/95.  Rate is 72.  GENERAL:  The patient appears to be oriented to person, time and place.  His affect reveals that he is quiet until asked a specific question, at  which time he responds with a short answer.  HEENT:  There is no  xanthelasma.  There is normal extraocular motion.  There are no carotid bruits.  There is no jugular venous distention.  LUNGS:  Clear.  Respiratory effort is not labored.  CARDIAC:  Exam reveals an S1 with an S2.  There are no clicks or  significant murmurs.  ABDOMEN:  Soft.  There are no masses or bruits.  EXTREMITIES:  The patient has no significant peripheral edema today.   EKG shows no significant abnormality.  One PVC is noted.  When the  patient was seen in the emergency room his BUN was 12 with a creatinine  of 0.8 and hemoglobin of 14.  His chest x-ray when reported in the  emergency room revealed no active disease.   PROBLEMS:  1. History of some dizziness.  2. History of some mild right-arm numbness.  3. Premature ventricular contraction on his electrocardiogram.  4. History of a seizure disorder for which he is on Dilantin.  5. Some discomfort in his feet with walking.  He does not describe      classic claudication to me at this time.  6. Hypertension on medication.  7. Some arthritis.  8. Hypokalemia when in the emergency room.   Jeffrey Serrano appears stable at this time.  I have recommended no further  cardiac workup at this time.  I will be available to help, particularly  if I hear back from the doctors at [facility] concerning any specific  additional questions.     Luis Abed, MD, [facility]  Electronically Signed    JDK/MedQ  DD: 05/05/2006  DT: 05/05/2006  Job #: [id number]   cc:   Dala Dock on [address].

## 2010-08-03 NOTE — Discharge Summary (Signed)
[place]. [facility]  Patient:    Jeffrey Serrano, Jeffrey Serrano                       MRN: [id number] Adm. Date:  [id number] Disc. Date: 12/12/99 Attending:  Arsenio Loader Dictator:   Duncan Dull, M.D. CC:         Charlynne Pander. Bruna Potter, M.D., Phone [phone]   Discharge Summary  DATE OF BIRTH:  1950/05/20  DISCHARGE DIAGNOSES: 1. Seizures, induced by alcohol withdrawal. 2. Alcohol dependence. 3. Hypertension. 4. Ventricular bigeminy. 5. Hyperkalemia. 6. Tobacco dependence. 7. Thrombocytopenia.  MEDICATIONS: 1. Multivitamin with thiamine and folate one pill daily. 2. Metoprolol 12.5 mg one p.o. b.i.d.  CONSULTATIONS:  None.  PROCEDURES:  A 2-D ECHO of heart done on December 10, 1999 with the following results: Normal left ventricular size with mild eccentric hypertrophy, overall left ventricular systolic function normal with ejection fraction in the range of 55-65%, and no left ventricular regional wall motion abnormalities.  HISTORY OF PRESENT ILLNESS:  Patient is a 60 year old black male with a history of hypertension and alcohol abuse who was brought to the ED by EMS after his wife witnessed him having a generalized tonic clonic seizure at 4 a.m. while asleep.  Patient had been drinking heavily over the last several months and his last alcohol drink had been a beer six to eight hours prior to seizure.  He was brought to the ED in an unresponsive state and had another seizure witnessed in the ED by the physicians and was given 2 mg of Ativan IV and loaded with Dilantin.  He was verbally unresponsive at time of evaluation. Patients wife says he had apparently been drinking heavily since he lost his job in March.  He had no history of witnessed seizures other than his wife states that he apparently had one at work earlier in the year and had been brought to the ER but was not admitted.  He had no history of head trauma although he has been unstable on his feet  when drinking and apparently bumped his head several months ago after falling down his front steps.  While in the ED, patient was noticed to have a ventricular bigeminal rhythm by 12-lead EKG and by rhythm strips.  PHYSICAL EXAMINATION:  VITAL SIGNS:  On admission, temperature of 99.5, pulse of 104, BP 170/110, respiratory rate 20, O2 saturations of 98%.  GENERAL:  Patient was drowsy, stuporous, and responding to physical stimulus with purposeful movement.  NEUROLOGIC:  Nonfocal.  EKG:  Normal axis with normal sinus tachycardia with nonspecific ST wave changes in V2-V3.  With regard to the ST changes, these were present on an earlier EKG done in 1998.  Notable on the EKG of December 08, 1999 was PT-T waves in all leads.  CHEST X-RAY:  Mild cardiomegaly with no effusions and no active disease.  HEAD CT WITHOUT CONTRAST:  No acute bleeds but was positive for diffuse atrophy.  ADMISSION LABORATORIES:  Sodium 137, potassium 5.3, chloride 101, bicarb 27, BUN 12, creatinine 1.2, glucose 110.  Total bili 1.2, ALK PHOS 94, SGOT 55, SGPT 26, Total protein 6.9, albumin 3.4.  His blood alcohol level on admission was less than 10.  Magnesium was 2.1.  White blood cells 7.2, hemoglobin 15.3, platelets 134, ANC of 1.7, ____ of 99.6, RDW 12.5.  CK 171, MB 1.9, troponin less than 0.03.  #1 - NEW ONSET SEIZURES:  Patient was admitted with new onset  seizures.  He was loaded with Dilantin and given an Ativan in the ED.  Head CT was negative for bleeds.  The immediate etiology of his seizures was presumed to be alcohol withdrawal however an MRI was scheduled to look at posterior fossa to rule out any CNS inflammation or trauma or tumors.  It became apparent within several hours of admission however that patients seizures were secondary to alcohol withdrawal.  MRI was cancelled and patient was started on a Librium taper.  By day #3 of admission, patient was discontinued from his Librium taper as  he showed no signs of withdrawal and had no further seizures.  His Dilantin was discontinued and he was ambulating with minimal assistance at time of discharge.  #2 - VENTRICULAR TRIGEMINY/BIGEMINY:  This was presumed to be secondary to his hyperkalemic state upon admission; however, he was ruled out for acute coronary syndrome with normal serial cardiac enzymes x 3.  He was admitted to a telemetry bed and followed for arrhythmias.  Patient continued to have ventricular bigeminy but by day #2 was noted to have occasional PVCs but infrequent.  #3 - HYPERKALEMIA:  This was presumed to be either secondary to redistribution as a result of his seizures causing a mild metabolic acidosis versus renal failure versus the medications.  His Lotensin was held as it was theorized that it may have induced a hyperkalemic state secondary to hypoaldosterone state.  He was started on Lasix and his hyperkalemia resolved after one dose of 40 mg of Lasix.  #4 - HYPERTENSION:  Patients Lotensin was held secondary to his hyperkalemic state.  He was started on metoprolol 12.5 mg p.o. b.i.d.  Upon discharge, he was continued on his metoprolol in the event that his hyperkalemia had been caused by the ACE inhibitor.  #5 - ALCOHOL ABUSE:  Patient was counseled repeatedly regarding his alcohol dependence.  He refused to admit that he had an alcohol problem.  He was given thiamine and folate supplementation daily to prevent ______ syndrome.  He was sent home with a prescription for a multivitamins with thiamine and folate supplementation.  He was also counseled by physician and care manager regarding his alcohol dependence.  Although patient refused to admit that he had a problem he was given the phone number and address of [facility] and strongly encouraged to treat his alcohol dependence by presenting himself to the [facility] for help.  #6 - TOBACCO DEPENDENCE:  Patient refused counseling on smoking cessation.   #7 -  THROMBOCYTOPENIA:  Patients platelets at time of admission were 134. These were noted to drop slightly during admission.  At time of discharge, platelets were 94,000.  His thrombocytopenia was presumed to be secondary to alcohol-induced bone marrow suppression.  He had no evidence of bleeding or DIC during hospitalization.  DISCHARGE LABORATORIES:  B12 serum 890, serum TSH 0.849, serum RPR negative. Sodium 136, potassium 4.0, chloride 103, bicarb 27, BUN 7, creatinine 0.9, glucose 96.  White blood cells 7.2, hemoglobin 14.1, platelets 94.  DISCHARGE INSTRUCTIONS:  Patient was instructed to follow up with [facility] for regarding his alcohol dependence.  Additionally, he was counseled to follow up with his primary care doctor, Dr. Clyda Greener, as needed.  Dr. Blima Singer phone number is [phone]. DD:  12/12/99 TD:  12/12/99 Job: [id number]

## 2010-08-03 NOTE — Op Note (Signed)
NAMESEVERIN, Jeffrey Serrano                ACCOUNT NO.:  [id number]   MEDICAL RECORD NO.:  [id number]          PATIENT TYPE:  AMB   LOCATION:  SDS                          FACILITY:  [facility]   PHYSICIAN:  Di Kindle. Edilia Bo, M.D.DATE OF BIRTH:  07-14-1950   DATE OF PROCEDURE:  02/18/2005  DATE OF DISCHARGE:                                 OPERATIVE REPORT   INDICATIONS:  This is a pleasant 60 year old gentleman who I had been  following with for bilateral lower extremity claudication. I saw him in the  office on 02/13/2005 and he had progression of his symptoms and wished to  proceed with arteriography to see what options he might have for  revascularization.   TECHNIQUE:  The patient was taken to the [facility] lab at [facility] and  sedated with a milligram of Versed 15 mcg of fentanyl. Both groins were  prepped and draped in the usual sterile fashion. I could not palpate a right  femoral pulse. He did have a palpable left femoral pulse. After the skin was  anesthetized with 1% lidocaine the left common femoral artery was cannulated  and the guidewire introduced into the infrarenal aorta under fluoroscopic  control after a 5-French sheath had been introduced. A flush aortogram was  obtained. The catheter was then repositioned above the aortic bifurcation  and oblique iliac projections were obtained.  Next, bilateral lower  extremity runoff films were obtained.   FINDINGS:  There were single renal arteries bilaterally with no significant  renal artery stenosis identified. The distal half of the infrarenal aorta  has diffuse disease but no focal stenosis.   On the right side there is moderate-to-severe diffuse disease of the common  iliac artery with very ulcerated plaque. The hypogastric artery is patent.  There is two tandem stenosis in the external iliac artery on the right.  The  most distal stenosis produces an approximately 95% stenosis. The common  femoral and deep femoral  artery are patent on the right with some mild  proximal deep femoral artery disease. The superficial femoral artery is  occluded at its origin, with reconstitution of the above-knee popliteal  artery. The popliteal artery has mild diffuse disease. A single vessel  runoff to the peroneal artery on the right. Posterior tibial and anterior  tibial arteries were occluded.   On the left side there is moderate diffuse disease throughout the common  iliac and external iliac artery on the right.  The hypogastric artery is  patent, that is on the left.  The common femoral and deep femoral artery are  patent on the left. The superficial femoral artery is occluded at its origin  with reconstitution of the above-knee popliteal artery. The popliteal artery  on the left has mild diffuse disease and there is 2-vessel runoff on the  left, via the peroneal and posterior tibial artery; although the posterior  tibial artery has moderate diffuse disease and is occluded above the ankle.  Anterior tibial artery is occluded on the left.   CONCLUSIONS:  1.  Diffuse aortoiliac occlusive disease as described above.  2.  Bilateral superficial femoral artery occlusions.  3.  Severe tibial occlusive disease as described above.      Di Kindle. Edilia Bo, M.D.  Electronically Signed     CSD/MEDQ  D:  02/18/2005  T:  02/18/2005  Job:  [id number]   cc:   [facility] at [facility] L. Philipp Deputy, M.D.  Fax: [phone]

## 2011-05-28 ENCOUNTER — Other Ambulatory Visit: Payer: Self-pay | Admitting: Neurology

## 2011-05-28 DIAGNOSIS — G40909 Epilepsy, unspecified, not intractable, without status epilepticus: Secondary | ICD-10-CM

## 2011-06-05 ENCOUNTER — Inpatient Hospital Stay: Admission: RE | Admit: 2011-06-05 | Payer: Self-pay | Source: Ambulatory Visit

## 2013-05-05 ENCOUNTER — Other Ambulatory Visit: Payer: Self-pay | Admitting: Family Medicine

## 2013-05-10 ENCOUNTER — Encounter (HOSPITAL_COMMUNITY): Payer: Self-pay | Admitting: Emergency Medicine

## 2013-05-10 ENCOUNTER — Emergency Department (HOSPITAL_COMMUNITY): Payer: Medicare HMO

## 2013-05-10 ENCOUNTER — Emergency Department (HOSPITAL_COMMUNITY)
Admission: EM | Admit: 2013-05-10 | Discharge: 2013-05-11 | Disposition: A | Discharge status: Home / Self Care | Payer: Medicare HMO | Attending: Emergency Medicine | Admitting: Emergency Medicine

## 2013-05-10 DIAGNOSIS — I10 Essential (primary) hypertension: Secondary | ICD-10-CM | POA: Insufficient documentation

## 2013-05-10 DIAGNOSIS — Y9389 Activity, other specified: Secondary | ICD-10-CM | POA: Insufficient documentation

## 2013-05-10 DIAGNOSIS — Z79899 Other long term (current) drug therapy: Secondary | ICD-10-CM | POA: Insufficient documentation

## 2013-05-10 DIAGNOSIS — Y939 Activity, unspecified: Secondary | ICD-10-CM | POA: Insufficient documentation

## 2013-05-10 DIAGNOSIS — R000 Tachycardia, unspecified: Secondary | ICD-10-CM | POA: Insufficient documentation

## 2013-05-10 DIAGNOSIS — R61 Generalized hyperhidrosis: Secondary | ICD-10-CM | POA: Insufficient documentation

## 2013-05-10 DIAGNOSIS — G40909 Epilepsy, unspecified, not intractable, without status epilepticus: Secondary | ICD-10-CM | POA: Insufficient documentation

## 2013-05-10 DIAGNOSIS — X58XXXA Exposure to other specified factors, initial encounter: Secondary | ICD-10-CM | POA: Insufficient documentation

## 2013-05-10 DIAGNOSIS — R569 Unspecified convulsions: Secondary | ICD-10-CM

## 2013-05-10 DIAGNOSIS — Y9289 Other specified places as the place of occurrence of the external cause: Secondary | ICD-10-CM | POA: Insufficient documentation

## 2013-05-10 DIAGNOSIS — R259 Unspecified abnormal involuntary movements: Secondary | ICD-10-CM | POA: Insufficient documentation

## 2013-05-10 DIAGNOSIS — S51009A Unspecified open wound of unspecified elbow, initial encounter: Secondary | ICD-10-CM | POA: Insufficient documentation

## 2013-05-10 HISTORY — DX: Essential (primary) hypertension: I10

## 2013-05-10 HISTORY — DX: Unspecified convulsions: R56.9

## 2013-05-10 LAB — CBC WITH DIFFERENTIAL/PLATELET
Basophils Absolute: 0 10*3/uL (ref 0.0–0.1)
Basophils Relative: 0 % (ref 0–1)
Eosinophils Absolute: 0 10*3/uL (ref 0.0–0.7)
Eosinophils Relative: 0 % (ref 0–5)
HCT: 46.2 % (ref 39.0–52.0)
Hemoglobin: 16 g/dL (ref 13.0–17.0)
Lymphocytes Relative: 19 % (ref 12–46)
Lymphs Abs: 1.7 10*3/uL (ref 0.7–4.0)
MCH: 32 pg (ref 26.0–34.0)
MCHC: 34.6 g/dL (ref 30.0–36.0)
MCV: 92.4 fL (ref 78.0–100.0)
Monocytes Absolute: 0.6 10*3/uL (ref 0.1–1.0)
Monocytes Relative: 6 % (ref 3–12)
Neutro Abs: 6.8 10*3/uL (ref 1.7–7.7)
Neutrophils Relative %: 75 % (ref 43–77)
Platelets: 167 10*3/uL (ref 150–400)
RBC: 5 MIL/uL (ref 4.22–5.81)
RDW: 13.5 % (ref 11.5–15.5)
WBC: 9 10*3/uL (ref 4.0–10.5)

## 2013-05-10 LAB — I-STAT TROPONIN, ED: Troponin i, poc: 0 ng/mL (ref 0.00–0.08)

## 2013-05-10 LAB — CBG MONITORING, ED: Glucose-Capillary: 118 mg/dL — ABNORMAL HIGH (ref 70–99)

## 2013-05-10 LAB — BASIC METABOLIC PANEL
BUN: 11 mg/dL (ref 6–23)
CO2: 24 mEq/L (ref 19–32)
Calcium: 9.4 mg/dL (ref 8.4–10.5)
Chloride: 100 mEq/L (ref 96–112)
Creatinine, Ser: 1.02 mg/dL (ref 0.50–1.35)
GFR calc Af Amer: 88 mL/min — ABNORMAL LOW (ref >90)
GFR calc non Af Amer: 76 mL/min — ABNORMAL LOW (ref >90)
Glucose, Bld: 126 mg/dL — ABNORMAL HIGH (ref 70–99)
Potassium: 3.9 mEq/L (ref 3.7–5.3)
Sodium: 139 mEq/L (ref 137–147)

## 2013-05-10 LAB — URINALYSIS, ROUTINE W REFLEX MICROSCOPIC
Glucose, UA: NEGATIVE mg/dL
Hgb urine dipstick: NEGATIVE
Ketones, ur: 15 mg/dL — AB
Leukocytes, UA: NEGATIVE
Nitrite: NEGATIVE
Protein, ur: NEGATIVE mg/dL
Specific Gravity, Urine: 1.027 (ref 1.005–1.030)
Urobilinogen, UA: 1 mg/dL (ref 0.0–1.0)
pH: 5.5 (ref 5.0–8.0)

## 2013-05-10 LAB — PHENYTOIN LEVEL, TOTAL: Phenytoin Lvl: 15.9 ug/mL (ref 10.0–20.0)

## 2013-05-10 MED ORDER — SODIUM CHLORIDE 0.9 % IV SOLN
1000.0000 mg | Freq: Once | INTRAVENOUS | Status: AC
  Administered 2013-05-11: 1000 mg via INTRAVENOUS
  Filled 2013-05-10: qty 10

## 2013-05-10 MED ORDER — SODIUM CHLORIDE 0.9 % IV BOLUS (SEPSIS)
1000.0000 mL | Freq: Once | INTRAVENOUS | Status: AC
  Administered 2013-05-10: 1000 mL via INTRAVENOUS

## 2013-05-10 NOTE — ED Provider Notes (Signed)
CSN: [id number]     Arrival date & time 05/10/13  1604 History   First MD Initiated Contact with Patient 05/10/13 1626     Chief Complaint  Patient presents with  . Seizures   (Consider location/radiation/quality/duration/timing/severity/associated sxs/prior Treatment) Patient is a 63 y.o. male presenting with seizures. The history is provided by the patient and medical records.  Seizures  This is a 63 year old male with past medical history significant for hypertension, and seizure disorder, presenting to the ED following a witnessed seizure at work. 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  Patient states he was not quite sure what happened. He has not had a seizure in several months. Patient takes Dilantin, states he has been compliant with his medications. He denies any current headache, dizziness, unilateral weakness, changes in speech, tinnitus, or confusion.  Patient does have a small skin tear to his left arm, unsure what he hit his arm on.  Pt denies any symptoms at this time.  No recent medication changes.  No recent fevers or illness.  VS stable on arrival.  Pt does not recall who his neurologist is. PCP-- [facility]  Past Medical History  Diagnosis Date  . Seizures   . Hypertension    History reviewed. No pertinent past surgical history. No family history on file. History  Substance Use Topics  . Smoking status: Never Smoker   . Smokeless tobacco: Not on file  . Alcohol Use: No    Review of Systems  Neurological: Positive for seizures.  All other systems reviewed and are negative.    Allergies  Review of patient's allergies indicates no known allergies.  Home Medications   Current Outpatient Rx  Name  Route  Sig  Dispense  Refill  . citalopram (CELEXA) 10 MG tablet   Oral   Take 10 mg by mouth daily.         . cloNIDine (CATAPRES) 0.1 MG tablet   Oral   Take 0.1 mg by mouth 2 (two) times daily.         [name]  escitalopram (LEXAPRO) 10 MG tablet   Oral   Take 10 mg by mouth daily.         [name] gabapentin (NEURONTIN) 100 MG capsule   Oral   Take 100 mg by mouth daily.         . pentoxifylline (TRENTAL) 400 MG CR tablet   Oral   Take 400 mg by mouth 3 (three) times daily with meals.         . phenytoin (DILANTIN) 100 MG ER capsule   Oral   Take 100 mg by mouth daily.         . sulindac (CLINORIL) 200 MG tablet   Oral   Take 200 mg by mouth daily.          BP 162/109  Pulse 93  Temp(Src) 97.7 F (36.5 C) (Oral)  SpO2 98%  Physical Exam  Nursing note and vitals reviewed. Constitutional: He is oriented to person, place, and time. He appears well-developed and well-nourished. No distress.  HENT:  Head: Normocephalic and atraumatic.  Mouth/Throat: Uvula is midline, oropharynx is clear and moist and mucous membranes are normal. No lacerations.  No visible signs of head trauma; dentition intact, no mucosal injury or tongue laceration  Eyes: Conjunctivae and EOM are normal. Pupils are equal, round, and reactive to light.  Neck: Normal range of motion.  Cardiovascular: Normal rate,  regular rhythm and normal heart sounds.   Pulmonary/Chest: Effort normal and breath sounds normal. No respiratory distress. He has no wheezes.  Abdominal: Soft. Bowel sounds are normal. There is no tenderness. There is no guarding.  Musculoskeletal: Normal range of motion. He exhibits no edema.  Small skin tear to left elbow without active bleeding, no foreign body or signs of infection  Neurological: He is alert and oriented to person, place, and time. He has normal strength. He displays tremor. No cranial nerve deficit or sensory deficit. He displays no seizure activity. Gait normal.  AAOx3, answering questions appropriately; equal strength UE and LE bilaterally; CN grossly intact; moves all extremities appropriately without ataxia; no focal neuro deficits or facial asymmetry appreciated  Skin: Skin is  warm and dry. He is not diaphoretic.  Psychiatric: He has a normal mood and affect.    ED Course  Procedures (including critical care time)   Date: 05/10/2013  Rate: 90  Rhythm: normal sinus rhythm  QRS Axis: normal  Intervals: normal  ST/T Wave abnormalities: normal  Conduction Disutrbances:none  Narrative Interpretation:   Old EKG Reviewed: none available   Labs Review Labs Reviewed  BASIC METABOLIC PANEL - Abnormal; Notable for the following:    Glucose, Bld 126 (*)    GFR calc non Af Amer 76 (*)    GFR calc Af Amer 88 (*)    All other components within normal limits  CBG MONITORING, ED - Abnormal; Notable for the following:    Glucose-Capillary 118 (*)    All other components within normal limits  CBC WITH DIFFERENTIAL  PHENYTOIN LEVEL, TOTAL  I-STAT TROPOININ, ED   Imaging Review Dg Chest 2 View  05/10/2013   CLINICAL DATA:  Seizure.  Hypertension.  EXAM: CHEST  2 VIEW  COMPARISON:  DG CHEST 2 VIEW dated 04/30/2006; DG CHEST 2 VIEW dated 02/15/2005  FINDINGS: The lungs appear clear.  Cardiac and mediastinal contours normal.  No pleural effusion identified.  Glenohumeral alignment appears grossly normal.  IMPRESSION: 1.  No significant abnormality identified.   Electronically Signed   By: Sherryl Barters M.D.   On: 05/10/2013 21:35   Ct Head Wo Contrast  05/10/2013   CLINICAL DATA:  Past medical history of seizures and hypertension. Confusion.  EXAM: CT HEAD WITHOUT CONTRAST  TECHNIQUE: Contiguous axial images were obtained from the base of the skull through the vertex without intravenous contrast.  COMPARISON:  None.  FINDINGS: There is moderate central and cortical atrophy. There is periventricular white matter change, consistent with small vessel disease. There is no evidence for hemorrhage, mass lesion, or acute infarction. There is atherosclerotic calcification of the internal carotid arteries. Small air-fluid levels are identified within the sphenoid sinuses bilaterally.   IMPRESSION: 1.  No evidence for acute intracranial abnormality. 2. Atrophy and small vessel disease.   Electronically Signed   By: Shon Hale M.D.   On: 05/10/2013 21:54    EKG Interpretation   None       MDM   Final diagnoses:  Seizure   EKG NSR, no acute ischemic changes.  Trop negative.  Labs reassuring.  Dilantin level WNL.   9:06 PM Called to pts bedside for tremors.  Upon entering room, pt states he feels fine but is starting up at the ceiling with limited responses.  When asked where he is he states he is at the nurses station at work and cannot tell me month of the year as he could earlier.  He appears  somewhat diaphoretic and is not tachycardic to 123.  Will obtain CT head, CXR, u/a.  Will get rectal temp.  10:39 PM Pt reassess.  Has been given IV fluids with improvement of tachycardia.  CT head and CXR negative.  U/a negative for infection.  Rectal temp WNL.  Pt is now baseline oriented as he was on arrival to ED, answering questions and following commands appropriately.  [facility] neurology, Dr. Nicole Kindred-- recommends load with IV keppra, monitor for additional hour and if back to baseline may be discharged home on Keppra 500 BID, if not then recommend admission for overnight observation.  12:57 AM Keppra has finished infusing.  Pt has remained baseline oriented without focal deficits, tachycardia resolved, BP stable.  Pt states he feels fine and wants to go home.  Will start on keppra as recommended per Dr. Nicole Kindred.  Pt will FU with his neurologist to discuss this ED visit and medication changes.  Discussed plan with pt, he acknowledged understanding and agreed with plan of care.  Strict return precautions advised for new or worsening symptosm.  Larene Pickett, PA-C 05/11/13 0100

## 2013-05-10 NOTE — Progress Notes (Signed)
   CARE MANAGEMENT ED NOTE 05/10/2013  Patient:  Jeffrey Serrano, Jeffrey Serrano   Account Number:  [id number]  Date Initiated:  05/10/2013  Documentation initiated by:  Livia Snellen  Subjective/Objective Assessment:   Patient presents to Ed with a seizure     Subjective/Objective Assessment Detail:   Patient with pmhx of seizures and HTN     Action/Plan:   Action/Plan Detail:   Anticipated DC Date:       Status Recommendation to Physician:   Result of Recommendation:    Other ED [place]  Other  PCP issues    Choice offered to / List presented to:            Status of service:  Completed, signed off  ED Comments:   ED Comments Detail:  Patient confirms his pcp is Dr. Kennon Holter.  System updated.

## 2013-05-10 NOTE — ED Notes (Signed)
Pt was at work when he had a witnessed seizure. Accompanied by brother who does not know whole story. Pt seems somewhat confused. Hx of seizures. Unsure of what he takes for them. Has not had one in months. Unsure if pt hit head. Laceration on arm; bleeding controlled by bandage that EMS put on. Pt did not want to go with EMS.

## 2013-05-11 MED ORDER — LEVETIRACETAM 500 MG PO TABS: 500.0000 mg | ORAL_TABLET | Freq: Two times a day (BID) | ORAL | Status: DC

## 2013-05-11 NOTE — ED Provider Notes (Signed)
Medical screening examination/treatment/procedure(s) were conducted as a shared visit with non-physician practitioner(s) and myself.  I personally evaluated the patient during the encounter.  EKG Interpretation   None       Patient had transient episode of tachycardia and appearing to be mildly withdrawn. The patient did not have a focal seizure-like activity. Seemed to resolve spontaneously after several minutes. Possible seizure-like activity, will consult neurology. Patient back to his baseline after this episode.  Ephraim Hamburger, MD 05/11/13 [phone]

## 2013-05-11 NOTE — Discharge Instructions (Signed)
Take the prescribed medication as directed in addition to your other home medications. Follow-up with your neurologist and discuss this ED visit and medication change. Return to the ED for new or worsening symptoms.

## 2013-05-11 NOTE — ED Notes (Signed)
Pt visitor called writer to room due to generalized twitching/shaking. Upon arrival to room pt is looking around room, however confused and only alert to self. Pt states that he is in the nurses office at work and is unsure of the date or current year. Pt is tachycardiac on the monitor, oxygen saturation is WDL. Seizure precautions in placed and continued. EDPA notified, who came to room to assess patient. Will continue to monitor.

## 2015-09-29 ENCOUNTER — Ambulatory Visit (INDEPENDENT_AMBULATORY_CARE_PROVIDER_SITE_OTHER): Payer: Medicare HMO | Admitting: Neurology

## 2015-09-29 ENCOUNTER — Encounter: Payer: Self-pay | Admitting: Neurology

## 2015-09-29 VITALS — BP 91/70 | HR 105 | Ht 68.0 in | Wt 135.8 lb

## 2015-09-29 DIAGNOSIS — R569 Unspecified convulsions: Secondary | ICD-10-CM | POA: Diagnosis not present

## 2015-09-29 MED ORDER — LACOSAMIDE 100 MG PO TABS: 100.0000 mg | ORAL_TABLET | Freq: Two times a day (BID) | ORAL | Status: DC

## 2015-09-29 NOTE — Progress Notes (Signed)
NEUROLOGY CLINIC NEW PATIENT NOTE  NAME: Jeffrey Serrano DOB: 12-22-1950 REFERRING PHYSICIAN: Sherilyn Cooter, Serrano  I saw Jeffrey Serrano as a new consult in the neurovascular clinic today regarding  Chief Complaint  Patient presents with  . Referral    From Jeffrey Serrano for Seizures, pt was seen here years ago, at [facility] but does not know who it was  .  HPI: Jeffrey Serrano is a 65 y.o. male with PMH of Seizure disorder who presents as a new patient for Seizure. Patient complains by his brother, however both of them are poor historian, and not able to provide detailed information. However as per his brother, patient had history of a seizure disorder on Dilantin. About one month ago, he was getting out of the elevator, suddenly fell. Brother was able to hold him and lowered him to the ground. [name] witnessed all 4 extremities shivering and stiffness, lasting about 2-3 minutes. Denies any tongue biting, urinary or bowel incontinence. After that episode, as per brother he woke up, without a confusion or any difficulty, and went on to work that day.  As per note, he had ED visit on 05/10/2013 due to seizure. 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   He and his brother cannot tell when he started to have seizure, when Dilantin was started and how often of the seizure. Patient only takes Dilantin 100 mg every day. However, the instruction written is for Dilantin 100 mg twice a day. Brother does note that patient had significant memory issues, not able to remember things, this is at least for about one year. However he cannot provide a further information and stated that patient wife may know it.  He denies any cigarette smoking, alcohol or illicit drugs. He is still working every day, however, he is not driving and he is taking bus to work.  Past Medical  History  Diagnosis Date  . Seizures ([place])   . Hypertension   . PVD (peripheral vascular disease) ([place])    History reviewed. No pertinent past surgical history. Family History  Problem Relation Age of Onset  . Seizures Father    Current Outpatient Prescriptions  Medication Sig Dispense Refill  . Cholecalciferol (VITAMIN D3) 1000 units CAPS Take by mouth.    [name] lisinopril-hydrochlorothiazide (PRINZIDE,ZESTORETIC) 10-12.5 MG tablet Take 1 tablet by mouth daily.  0  . pentoxifylline (TRENTAL) 400 MG CR tablet Take 400 mg by mouth 3 (three) times daily with meals.    . Lacosamide 100 MG TABS Take 1 tablet (100 mg total) by mouth 2 (two) times daily. 60 tablet 3   No current facility-administered medications for this visit.   No Known Allergies Social History   Social History  . Marital Status: Legally Separated    Spouse Name: N/A  . Number of Children: N/A  . Years of Education: N/A   Occupational History  . Not on file.   Social History Main Topics  . Smoking status: Never Smoker   . Smokeless tobacco: Not on file  . Alcohol Use: No  . Drug Use: No  . Sexual Activity: Not on file   Other Topics Concern  . Not on file   Social History Narrative    Review of Systems Full 14 system review of systems performed and notable only for  those listed, all others are neg:  Constitutional:   Cardiovascular: Swelling in legs Ear/Nose/Throat:   Skin:  Eyes:   Respiratory:   Gastroitestinal:   Genitourinary:  Hematology/Lymphatic:   Endocrine:  Musculoskeletal:   Allergy/Immunology:   Neurological:  Memory loss, seizure Psychiatric:  Sleep:    Physical Exam  Filed Vitals:   09/29/15 1141  BP: 91/70  Pulse: 105    General - Well nourished, well developed, in no apparent distress.  Ophthalmologic - Sharp disc margins OU.  Cardiovascular - Regular rate and rhythm with no murmur.   Neck - supple, no nuchal rigidity .  Mental Status -  Level of arousal and  orientation to time, place, and person were intact. Language including expression, repetition, comprehension, reading, and writing was assessed and found intact, however 3/4 naming and slow in response. Attention span and concentration were decreased, not able to backward spelling, but able to calculate although slow. Recent and remote memory were impaired. Fund of Knowledge was assessed and was impaired.  Cranial Nerves II - XII - II - Visual field intact OU. III, IV, VI - Extraocular movements intact. V - Facial sensation intact bilaterally. VII - Facial movement intact bilaterally. VIII - Hearing & vestibular intact bilaterally. X - Palate elevates symmetrically. XI - Chin turning & shoulder shrug intact bilaterally. XII - Tongue protrusion intact.  Motor Strength - The patient's strength was normal in all extremities and pronator drift was absent.  Bulk was normal and fasciculations were absent.   Motor Tone - Muscle tone was assessed at the neck and appendages and was normal.  Reflexes - The patient's reflexes were normal in all extremities and he had no pathological reflexes.  Sensory - Light touch, temperature/pinprick, vibration and proprioception, and Romberg testing were assessed and were normal.    Coordination - The patient had normal movements in the hands and feet with no ataxia or dysmetria.  Tremor was absent.  Gait and Station - The patient's transfers, posture, gait, station, and turns were observed as normal.   Imaging  None  Lab Review none   Assessment and Plan:   In summary, AMPARO Serrano is a 65 y.o. male with PMH of Seizure disorder presents for seizure evaluation. Patient and his brother are both poor historian, not sure about when seizure started, when Dilantin was started, and [place] of seizure. Apparently, he had seizure in 04/2013 and about one month ago, during both seizures, he lost consciousness and fell. He takes Dilantin 100 mg every day only  although instructions said 100 mg twice a day. Due to the low dose, I assume the Dilantin level will be very subtherapeutic. At this time, we will seek about a second generation of AEDs such as Vimpat. We'll also need seizure workup including MRI and EEG. He is not driving at this time.  - will prescribe vimpat for seizure. Stop Dilantin once Vimpat starts.  - will do MRI and EEG - pt is not driving  - Seizure precautions - continue your other medications - follow up with Dr. Pearline Cables  - follow up in 2 months.   Thank you very much for the opportunity to participate in the care of this patient.  Please do not hesitate to call if any questions or concerns arise.  Orders Placed This Encounter  Procedures  . MR Brain W Wo Contrast    Standing Status: Future     Number of Occurrences:      Standing Expiration Date: 11/30/2016  Order Specific Question:  Reason for Exam (SYMPTOM  OR DIAGNOSIS REQUIRED)    Answer:  seizure disorder with memory loss    Order Specific Question:  Preferred imaging location?    Answer:  Internal    Order Specific Question:  Does the patient have a pacemaker or implanted devices?    Answer:  No    Order Specific Question:  What is the patient's sedation requirement?    Answer:  No Sedation  . EEG adult    Standing Status: Future     Number of Occurrences:      Standing Expiration Date: 09/28/2016    Order Specific Question:  Where should this test be performed?    Answer:  [facility]    Meds ordered this encounter  Medications  . lisinopril-hydrochlorothiazide (PRINZIDE,ZESTORETIC) 10-12.5 MG tablet    Sig: Take 1 tablet by mouth daily.    Refill:  0  . Cholecalciferol (VITAMIN D3) 1000 units CAPS    Sig: Take by mouth.  . Lacosamide 100 MG TABS    Sig: Take 1 tablet (100 mg total) by mouth 2 (two) times daily.    Dispense:  60 tablet    Refill:  3    Patient Instructions  - continue dilantin as prescribed 100mg  twice a day but discontinue once you get  vimpat  - will prescribe vimpat for seizure. Will do prior authorization for you.  - will do MRI and EEG - pt is not driving  - Seizure precautions - continue your other medications - follow up with Dr. Pearline Cables  - follow up in 2 months.     [name], MD PhD [facility] [address], [place], [place] [address] [phone]

## 2015-09-29 NOTE — Patient Instructions (Addendum)
-   continue dilantin as prescribed 100mg  twice a day but discontinue once you get vimpat  - will prescribe vimpat for seizure. Will do prior authorization for you.  - will do MRI and EEG - pt is not driving  - Seizure precautions - continue your other medications - follow up with Dr. Pearline Cables  - follow up in 2 months.

## 2015-10-03 ENCOUNTER — Telehealth: Payer: Self-pay

## 2015-10-03 NOTE — Telephone Encounter (Signed)
RN [facility] pharmacy at [phone] for PA for vimpat 100mg  tablet twice a day for seizures. Rn was given reference number [id number]. [employer] will response via fax within 24 to 48 hours of acceptance or denial letter. Pt has tired keppra and neurontin per DR [name] note and medication list.

## 2015-10-04 NOTE — Telephone Encounter (Signed)
PA APPROVE FROM [employer] FOR VIMPAT 100MG  1 TABLET TWICE A DAY TILL 10/03/2015 TO 10/02/2017

## 2015-10-04 NOTE — Telephone Encounter (Signed)
RN call [facility] pharmacy about the medication. The pharmacist staff stated patient had a deductible and paid the 500.00 dollars. [facility] stated it may go down but pt will have to call his insurance company.Pt will be call.

## 2015-10-04 NOTE — Telephone Encounter (Signed)
Pt's brother called said the vimpat after the approval will cost $500. He is requesting RN to call [facility] on [address] [phone] asap

## 2015-10-04 NOTE — Telephone Encounter (Signed)
RN call patients brother Jeffrey Serrano about his vimpat being approve. Rn stated he is stop taking the dilantin once the vimpat gets approve. Rn stated the 100mg  tablet was approve for one tablet twice a day. Pt does not drive and his brother takes him to the pharmacy and MD appts. Rn stated the medication was fax 09/29/2015 to his pharmacy. Pts brother verbalized understanding of picking up the vimpat and start taking it. Pt is to discontinue the dilantin.Pts brother verbalized understanding.

## 2015-11-02 ENCOUNTER — Ambulatory Visit (INDEPENDENT_AMBULATORY_CARE_PROVIDER_SITE_OTHER): Payer: Medicare HMO | Admitting: Neurology

## 2015-11-02 DIAGNOSIS — R569 Unspecified convulsions: Secondary | ICD-10-CM | POA: Diagnosis not present

## 2015-11-02 NOTE — Procedures (Signed)
    History:  Jeffrey Serrano is a 65 year old patient who has a history of a seizure disorder treated with Dilantin. The patient had a sudden fall while getting out of an elevated a month ago with stiffening and jerking on all 4 extremities lasting 2-3 minutes. The patient is being evaluated for this event.  This is a routine EEG. No skull defects are noted. Medications include vitamin D supplementation, Vimpat, Prinzide, and Trental.   EEG classification: Normal awake  Description of the recording: The background rhythms of this recording consists of a fairly well modulated medium amplitude alpha rhythm of 8 Hz that is reactive to eye opening and closure. As the record progresses, the patient appears to remain in the waking state throughout the recording. Photic stimulation was performed, resulting in a bilateral and symmetric photic driving response. Hyperventilation was also performed, resulting in a minimal buildup of the background rhythm activities without significant slowing seen. At no time during the recording does there appear to be evidence of spike or spike wave discharges or evidence of focal slowing. EKG monitor shows no evidence of cardiac rhythm abnormalities with a heart rate of 60.  Impression: This is a normal EEG recording in the waking state. No evidence of ictal or interictal discharges are seen.

## 2015-12-04 ENCOUNTER — Ambulatory Visit (INDEPENDENT_AMBULATORY_CARE_PROVIDER_SITE_OTHER): Payer: Medicare HMO | Admitting: Neurology

## 2015-12-04 ENCOUNTER — Encounter: Payer: Self-pay | Admitting: Neurology

## 2015-12-04 VITALS — BP 139/88 | HR 64 | Ht 68.0 in | Wt 147.6 lb

## 2015-12-04 DIAGNOSIS — R569 Unspecified convulsions: Secondary | ICD-10-CM

## 2015-12-04 NOTE — Progress Notes (Signed)
NEUROLOGY CLINIC FOLLOW UP NOTE  NAME: Jeffrey Serrano DOB: 1950/07/27  HPI: Jeffrey Serrano is a 65 y.o. male with PMH of Seizure disorder who presents as a new patient for Seizure. Patient complains by his brother, however both of them are poor historian, and not able to provide detailed information. However as per his brother, patient had history of a seizure disorder on Dilantin. About one month ago, he was getting out of the elevator, suddenly fell. Brother was able to hold him and lowered him to the ground. [name] witnessed all 4 extremities shivering and stiffness, lasting about 2-3 minutes. Denies any tongue biting, urinary or bowel incontinence. After that episode, as per brother he woke up, without a confusion or any difficulty, and went on to work that day.  As per note, he had ED visit on 05/10/2013 due to seizure. 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   He and his brother cannot tell when he started to have seizure, when Dilantin was started and how often of the seizure. Patient only takes Dilantin 100 mg every day. However, the instruction written is for Dilantin 100 mg twice a day. Brother does note that patient had significant memory issues, not able to remember things, this is at least for about one year. However he cannot provide a further information and stated that patient wife may know it.  He denies any cigarette smoking, alcohol or illicit drugs. He is still working every day, however, he is not driving and he is taking bus to work.  Interval history: During the interval time, pt has been doing well. No seizure episodes. Had EEG which was normal EEG. Has not have MRI done yet. On vimpat bid. Still on dilantin bid and asked pt to discontinue. Otherwise, no complains. He is not driving.  Past Medical History:  Diagnosis Date  .  Hypertension   . PVD (peripheral vascular disease) ([place])   . Seizures ([place])    History reviewed. No pertinent surgical history. Family History  Problem Relation Age of Onset  . Seizures Father    Current Outpatient Prescriptions  Medication Sig Dispense Refill  . Cholecalciferol (VITAMIN D3) 1000 units CAPS Take by mouth.    . gabapentin (NEURONTIN) 100 MG capsule     . Lacosamide 100 MG TABS Take 1 tablet (100 mg total) by mouth 2 (two) times daily. 60 tablet 3  . lisinopril-hydrochlorothiazide (PRINZIDE,ZESTORETIC) 10-12.5 MG tablet Take 1 tablet by mouth daily.  0   No current facility-administered medications for this visit.    No Known Allergies Social History   Social History  . Marital status: Legally Separated    Spouse name: N/A  . Number of children: N/A  . Years of education: N/A   Occupational History  . Not on file.   Social History Main Topics  . Smoking status: Never Smoker  . Smokeless tobacco: Never Used  . Alcohol use No  . Drug use: No  . Sexual activity: Not on file   Other Topics Concern  . Not on file   Social History Narrative  . No narrative on file    Review of Systems Full 14 system review of systems performed and notable only for those listed, all others are neg:  Constitutional:   Cardiovascular:  Ear/Nose/Throat:   Skin:  Eyes:  Respiratory:   Gastroitestinal:   Genitourinary:  Hematology/Lymphatic:   Endocrine:  Musculoskeletal:   Allergy/Immunology:   Neurological:   Psychiatric:  Sleep:    Physical Exam  Vitals:   12/04/15 1543  BP: 139/88  Pulse: 64    General - Well nourished, well developed, in no apparent distress.  Ophthalmologic - Sharp disc margins OU.  Cardiovascular - Regular rate and rhythm with no murmur.   Neck - supple, no nuchal rigidity .  Mental Status -  Level of arousal and orientation to time, place, and person were intact. Language including expression, repetition, comprehension,  reading, and writing was assessed and found intact, however 3/4 naming and slow in response. Attention span and concentration were decreased, not able to backward spelling, but able to calculate although slow. Recent and remote memory were impaired. Fund of Knowledge was assessed and was impaired.  Cranial Nerves II - XII - II - Visual field intact OU. III, IV, VI - Extraocular movements intact. V - Facial sensation intact bilaterally. VII - Facial movement intact bilaterally. VIII - Hearing & vestibular intact bilaterally. X - Palate elevates symmetrically. XI - Chin turning & shoulder shrug intact bilaterally. XII - Tongue protrusion intact.  Motor Strength - The patient's strength was normal in all extremities and pronator drift was absent.  Bulk was normal and fasciculations were absent.   Motor Tone - Muscle tone was assessed at the neck and appendages and was normal.  Reflexes - The patient's reflexes were normal in all extremities and he had no pathological reflexes.  Sensory - Light touch, temperature/pinprick, vibration and proprioception, and Romberg testing were assessed and were normal.    Coordination - The patient had normal movements in the hands and feet with no ataxia or dysmetria.  Tremor was absent.  Gait and Station - The patient's transfers, posture, gait, station, and turns were observed as normal.   Imaging  EEG - This is a normal EEG recording in the waking state. No evidence of ictal or interictal discharges are seen.  Lab Review none   Assessment:   In summary, Jeffrey Serrano is a 65 y.o. male with PMH of Seizure disorder presents for seizure evaluation. Patient and his brother are both poor historian, not sure about when seizure started, when Dilantin was started, and [place] of seizure. Apparently, he had seizure in 04/2013 and about one month ago, during both seizures, he lost consciousness and fell. He takes Dilantin 100 mg every day only although  instructions said 100 mg twice a day. Due to the low dose, I assume the Dilantin level will be very subtherapeutic. Changed to vimpat 100mg  bid dosing. Had EEG normal but MRI not done yet. He is not driving.  Plan: - continue vimpat for seizure control. Present the card when you fill the medication  - call the number [phone] to schedule MRI brain - pt is not driving  - Seizure precautions - continue your other medications - follow up with PCP  - follow up in 3 months.  No orders of the defined types were placed in this encounter.   Meds ordered this encounter  Medications  . gabapentin (NEURONTIN) 100 MG capsule    Patient Instructions  - continue vimpat for seizure control. Present the card when you fill the medication  - call the number [phone] to schedule MRI brain - pt is not driving  - Seizure precautions - continue your other medications - follow up with Dr. Pearline Cables  - follow  up in 3 months.   [name], MD PhD [facility] [address] [place], [place] [address] [phone]

## 2015-12-04 NOTE — Patient Instructions (Addendum)
-   continue vimpat for seizure control. Present the card when you fill the medication  - call the number [phone] to schedule MRI brain - pt is not driving  - Seizure precautions - continue your other medications - follow up with Dr. Pearline Cables  - follow up in 3 months.

## 2015-12-12 ENCOUNTER — Ambulatory Visit
Admission: RE | Admit: 2015-12-12 | Discharge: 2015-12-12 | Disposition: A | Discharge status: Home / Self Care | Payer: Medicare HMO | Source: Ambulatory Visit | Attending: Neurology | Admitting: Neurology

## 2015-12-12 DIAGNOSIS — R569 Unspecified convulsions: Secondary | ICD-10-CM | POA: Diagnosis not present

## 2015-12-12 MED ORDER — GADOBENATE DIMEGLUMINE 529 MG/ML IV SOLN
12.0000 mL | Freq: Once | INTRAVENOUS | Status: AC | PRN
  Administered 2015-12-12: 12 mL via INTRAVENOUS

## 2015-12-18 ENCOUNTER — Telehealth: Payer: Self-pay

## 2015-12-18 NOTE — Telephone Encounter (Signed)
LFt vm for patients brother to call about MRI results.

## 2015-12-18 NOTE — Telephone Encounter (Signed)
-----   Message from [name], MD sent at 12/17/2015  4:19 PM EDT ----- Could you please let the patient know that the MRI brain done recently in our office was unremarkable. Please continue current medication for seizure control. Thanks.  [name], MD PhD Stroke Neurology 12/17/2015 4:19 PM

## 2015-12-19 NOTE — Telephone Encounter (Signed)
Rn receive incoming call from pts brother [name]. Rn stated per Dr. [name] note the MRI brain was unremarkable. Continue taking seizure medication for control. Pts brother will notify him and verbalized understanding.

## 2016-01-29 ENCOUNTER — Other Ambulatory Visit: Payer: Self-pay | Admitting: Neurology

## 2016-01-29 DIAGNOSIS — R569 Unspecified convulsions: Secondary | ICD-10-CM

## 2016-02-15 ENCOUNTER — Other Ambulatory Visit: Payer: Self-pay

## 2016-02-15 DIAGNOSIS — R569 Unspecified convulsions: Secondary | ICD-10-CM

## 2016-02-15 MED ORDER — LACOSAMIDE 100 MG PO TABS
1.0000 | ORAL_TABLET | Freq: Two times a day (BID) | ORAL | 3 refills | Status: DC
Start: 2016-02-15 — End: 2016-08-13

## 2016-03-25 ENCOUNTER — Ambulatory Visit: Payer: Medicare HMO | Admitting: Neurology

## 2016-05-22 ENCOUNTER — Ambulatory Visit (INDEPENDENT_AMBULATORY_CARE_PROVIDER_SITE_OTHER): Payer: PPO | Admitting: Emergency Medicine

## 2016-05-22 ENCOUNTER — Encounter: Payer: Self-pay | Admitting: Physician Assistant

## 2016-05-22 VITALS — BP 138/90 | HR 64 | Temp 98.0°F | Resp 16 | Ht 68.0 in | Wt 158.8 lb

## 2016-05-22 DIAGNOSIS — G40909 Epilepsy, unspecified, not intractable, without status epilepticus: Secondary | ICD-10-CM | POA: Diagnosis not present

## 2016-05-22 DIAGNOSIS — I10 Essential (primary) hypertension: Secondary | ICD-10-CM | POA: Diagnosis not present

## 2016-05-22 NOTE — Patient Instructions (Addendum)
   IF you received an x-ray today, you will receive an invoice from [facility]. Please contact [facility] at [phone] with questions or concerns regarding your invoice.   IF you received labwork today, you will receive an invoice from [facility]. Please contact [facility] at [phone] with questions or concerns regarding your invoice.   Our billing staff will not be able to assist you with questions regarding bills from these companies.  You will be contacted with the lab results as soon as they are available. The fastest way to get your results is to activate your My Chart account. Instructions are located on the last page of this paperwork. If you have not heard from us regarding the results in 2 weeks, please contact this office.      Hypertension Hypertension is another name for high blood pressure. High blood pressure forces your heart to work harder to pump blood. This can cause problems over time. There are two numbers in a blood pressure reading. There is a top number (systolic) over a bottom number (diastolic). It is best to have a blood pressure below 120/80. Healthy choices can help lower your blood pressure. You may need medicine to help lower your blood pressure if:  Your blood pressure cannot be lowered with healthy choices.  Your blood pressure is higher than 130/80. Follow these instructions at home: Eating and drinking   If directed, follow the DASH eating plan. This diet includes:  Filling half of your plate at each meal with fruits and vegetables.  Filling one quarter of your plate at each meal with whole grains. Whole grains include whole wheat pasta, brown rice, and whole grain bread.  Eating or drinking low-fat dairy products, such as skim milk or low-fat yogurt.  Filling one quarter of your plate at each meal with low-fat (lean) proteins. Low-fat proteins include fish, skinless chicken, eggs, beans, and tofu.  Avoiding fatty meat,  cured and processed meat, or chicken with skin.  Avoiding premade or processed food.  Eat less than 1,500 mg of salt (sodium) a day.  Limit alcohol use to no more than 1 drink a day for nonpregnant women and 2 drinks a day for men. One drink equals 12 oz of beer, 5 oz of wine, or 1 oz of hard liquor. Lifestyle   Work with your doctor to stay at a healthy weight or to lose weight. Ask your doctor what the best weight is for you.  Get at least 30 minutes of exercise that causes your heart to beat faster (aerobic exercise) most days of the week. This may include walking, swimming, or biking.  Get at least 30 minutes of exercise that strengthens your muscles (resistance exercise) at least 3 days a week. This may include lifting weights or pilates.  Do not use any products that contain nicotine or tobacco. This includes cigarettes and e-cigarettes. If you need help quitting, ask your doctor.  Check your blood pressure at home as told by your doctor.  Keep all follow-up visits as told by your doctor. This is important. Medicines   Take over-the-counter and prescription medicines only as told by your doctor. Follow directions carefully.  Do not skip doses of blood pressure medicine. The medicine does not work as well if you skip doses. Skipping doses also puts you at risk for problems.  Ask your doctor about side effects or reactions to medicines that you should watch for. Contact a doctor if:  You think you are having a   reaction to the medicine you are taking.  You have headaches that keep coming back (recurring).  You feel dizzy.  You have swelling in your ankles.  You have trouble with your vision. Get help right away if:  You get a very bad headache.  You start to feel confused.  You feel weak or numb.  You feel faint.  You get very bad pain in your:  Chest.  Belly (abdomen).  You throw up (vomit) more than once.  You have trouble  breathing. Summary  Hypertension is another name for high blood pressure.  Making healthy choices can help lower blood pressure. If your blood pressure cannot be controlled with healthy choices, you may need to take medicine. This information is not intended to replace advice given to you by your health care provider. Make sure you discuss any questions you have with your health care provider. Document Released: 08/21/2007 Document Revised: 01/31/2016 Document Reviewed: 01/31/2016 Elsevier Interactive Patient Education  2017 [employer] Seizures, Adult A seizure can cause:  Involuntary movements, like falling or shaking.  Changes in awareness or consciousness.  Convulsions. These are episodes of uncontrollable, jerking movement caused by sudden, intense tightening (contraction) of the muscles. Epileptic seizures are caused by abnormal electrical activity in the brain. Non-epileptic seizures are different. They are not caused by abnormal electrical signals in your brain. These seizures may look like epileptic seizures, but they are not caused by epilepsy. There are two types of non-epileptic seizures:  Physiologic non-epileptic seizure. This type results from an underlying problem that causes a disruption in your brain's electrical activity.  Psychogenic non-epileptic seizure. This type results from emotional stress. These seizures are sometimes called pseudoseizures. What are the causes? Causes of physiologic non-epileptic seizures can include:  Sudden drop in blood pressure.  Low blood sugar (glucose).  Low levels of salt (sodium) in your blood.  Low levels of calcium in your blood.  Migraine.  Heart rhythm problems.  Sleep disorders, such as narcolepsy.  Movement disorders, such as Tourette syndrome.  Infection.  Certain medicines.  Drug and alcohol abuse.  Fever. Common causes of psychogenic non-epileptic seizures can  include:  Stress.  Emotional trauma.  Sexual or physical abuse.  Major life events, such as divorce or death of a loved one.  Mental health disorders, including anxiety and depression. What are the signs or symptoms? Symptoms of a non-epileptic seizure can be similar to those of an epileptic seizure, which may include:  A change in attention or behavior (altered mental status).  Loss of consciousness or fainting.  Convulsions with rhythmic jerking movements.  Drooling.  Rapid eye movements.  Grunting.  Loss of bladder control and bowel control.  Bitter taste in the mouth.  Tongue biting. Some people experience unusual sensations (aura) before having a seizure. These can include:  "Butterflies" in the stomach.  Abnormal smells or tastes.  A feeling of having had a new experience before (dj vu). After a non-epileptic seizure, you may have a headache or sore muscles or feel confused and sleepy. Non-epileptic seizures usually:  Do not cause physical injuries.  Start slowly.  Include crying or shrieking.  Last longer than 2 minutes.  Include pelvic thrusting. How is this diagnosed? Non-epileptic seizures may be diagnosed by:  Your medical history.  A physical exam.  Your symptoms.  Your health care provider may want to talk with your friends or relatives who have seen you have a seizure.  If possible, it is helpful if  you write down your seizure activity, including what led up to the seizure, and share that information with your health care provider. You may also need to have tests to look for causes of physiologic non-epileptic seizures. These may include:  An electroencephalogram (EEG). This test measures electrical activity in your brain. If you have had a non-epileptic seizure, the results of your EEG will likely be normal.  Video EEG. This test takes place in the hospital over the course of 2-7 days. The test uses a video camera and an EEG to monitor  your symptoms and the electrical activity in your brain.  Blood tests.  Lumbar puncture. This test involves pulling fluid from your spine to check for infection.  Electrocardiogram (ECG or EKG). This test checks for an abnormal heart rhythm.  CT scan. If your health care provider thinks you have had a psychogenic non-epileptic seizure, you may need to see a mental health specialist for an evaluation. How is this treated? The treatment for your seizures will depend on what is causing them. When the underlying condition is treated, your seizures should stop. If your seizures are being caused by emotional trauma or stress, your health care provider may recommend that you see a mental health professional. Treatment may include:  Relaxation therapy or cognitive behavioral therapy.  Medicines to treat depression or anxiety.  Individual or family counseling. In some cases, you may have psychogenic seizures in addition to epileptic seizures. If this is the case, you may be prescribed medicine to help with the epileptic seizures. Follow these instructions at home: Home care will depend on the type of non-epileptic seizures that you have. In general:  Follow all instructions from your health care provider. These may include ways to prevent seizures and what to do if you have a seizure.  Take over-the-counter and prescription medicines only as told by your health care provider.  Keep all follow-up visits as told by your health care provider. This is important.  Make sure family members, friends, and coworkers are trained on how to help you if you have a seizure. If you have a seizure, they should:  Lay you on the ground to prevent a fall.  Place a pillow or piece of clothing under your head.  Loosen any clothing around your neck.  Turn you onto your side. If vomiting occurs, this helps keep your airway clear.  Avoid any substances that may prevent your medicine from working properly. If  you are prescribed medicine for seizures:  Do not use recreational drugs.  Limit or avoid alcoholic beverages. Contact a health care provider if:  Your seizures change or become more frequent.  You continue to have seizures after treatment. Get help right away if:  You injure yourself during a seizure.  You have one seizure after another.  You have trouble recovering from a seizure.  You have chest pain or trouble breathing.  You have a seizure that lasts longer than 5 minutes. Summary  Non-epileptic seizures may look like epileptic seizures, but they are not caused by epilepsy.  The treatment for your seizures will depend on what is causing them. When the underlying condition is treated, your seizures should stop.  Make sure family members, friends, and coworkers are trained on how to help you if you have a seizure. If you have a seizure, they should lay you on the ground to prevent a fall, protect your head and neck, and turn you onto your side. This information is not intended  to replace advice given to you by your health care provider. Make sure you discuss any questions you have with your health care provider. Document Released: 04/19/2005 Document Revised: 12/15/2015 Document Reviewed: 12/15/2015 Elsevier Interactive Patient Education  2017 [employer].

## 2016-05-22 NOTE — Progress Notes (Signed)
Jeffrey Serrano 66 y.o.   Chief Complaint  Patient presents with  . Establish Care    HISTORY OF PRESENT ILLNESS: This is a 66 y.o. male has no complaints. Has h/o seizures and HTN.  HPI   Prior to Admission medications   Medication Sig Start Date End Date Taking? Authorizing Provider  Cholecalciferol (VITAMIN D3) 1000 units CAPS Take by mouth.   Yes Historical Provider, MD  gabapentin (NEURONTIN) 100 MG capsule  10/17/15  Yes Historical Provider, MD  Lacosamide (VIMPAT) 100 MG TABS Take 1 tablet (100 mg total) by mouth 2 (two) times daily. 02/15/16  Yes [name], MD  lisinopril-hydrochlorothiazide (PRINZIDE,ZESTORETIC) 10-12.5 MG tablet Take 1 tablet by mouth daily. 09/15/15  Yes Historical Provider, MD    No Known Allergies  Patient Active Problem List   Diagnosis Date Noted  . DEPRESSION 09/10/2006  . HYPERTENSION 09/10/2006  . PERIPHERAL VASCULAR DISEASE 09/10/2006  . Seizures ([place]) 09/10/2006    Past Medical History:  Diagnosis Date  . Hypertension   . PVD (peripheral vascular disease) ([place])   . Seizures ([place])     No past surgical history on file.  Social History   Social History  . Marital status: Legally Separated    Spouse name: N/A  . Number of children: N/A  . Years of education: N/A   Occupational History  . Not on file.   Social History Main Topics  . Smoking status: Never Smoker  . Smokeless tobacco: Never Used  . Alcohol use No  . Drug use: No  . Sexual activity: Not on file   Other Topics Concern  . Not on file   Social History Narrative  . No narrative on file    Family History  Problem Relation Age of Onset  . Seizures Father      Review of Systems  Constitutional: Negative for chills, fever and weight loss.  HENT: Negative for congestion, nosebleeds and sore throat.   Eyes: Negative for blurred vision and double vision.  Respiratory: Negative for cough, hemoptysis, shortness of breath and wheezing.   Cardiovascular:  Negative for chest pain, palpitations and claudication.  Gastrointestinal: Negative for abdominal pain, nausea and vomiting.  Genitourinary: Negative for dysuria and hematuria.  Skin: Negative for rash.  Neurological: Negative for dizziness, weakness and headaches.  All other systems reviewed and are negative.   Vitals:   05/22/16 1221  BP: 138/90  Pulse: 64  Resp: 16  Temp: 98 F (36.7 C)    Physical Exam  Constitutional: He is oriented to person, place, and time. He appears well-developed and well-nourished.  HENT:  Head: Normocephalic and atraumatic.  Nose: Nose normal.  Mouth/Throat: No oropharyngeal exudate.  Eyes: Conjunctivae and EOM are normal. Pupils are equal, round, and reactive to light.  Neck: Normal range of motion. Neck supple. No JVD present.  Cardiovascular: Normal rate, regular rhythm and normal heart sounds.   Pulmonary/Chest: Effort normal and breath sounds normal.  Abdominal: Soft. Bowel sounds are normal. There is no tenderness.  Musculoskeletal: Normal range of motion.  Lymphadenopathy:    He has no cervical adenopathy.  Neurological: He is alert and oriented to person, place, and time. He displays normal reflexes. No sensory deficit. He exhibits normal muscle tone.  Skin: Skin is warm and dry. Capillary refill takes less than 2 seconds.  Psychiatric: He has a normal mood and affect. His behavior is normal.  Vitals reviewed.    ASSESSMENT & PLAN: Jeffrey Serrano was seen today for establish  care.  Diagnoses and all orders for this visit:  Seizure disorder ([place]) -     CBC with Differential/Platelet -     Comprehensive metabolic panel -     Hepatitis C antibody  Essential hypertension -     CBC with Differential/Platelet -     Comprehensive metabolic panel -     Hepatitis C antibody    Patient Instructions       IF you received an x-ray today, you will receive an invoice from [facility]. Please contact [facility] at  [phone] with questions or concerns regarding your invoice.   IF you received labwork today, you will receive an invoice from [place]. Please contact [facility] at [phone] with questions or concerns regarding your invoice.   Our billing staff will not be able to assist you with questions regarding bills from these companies.  You will be contacted with the lab results as soon as they are available. The fastest way to get your results is to activate your My Chart account. Instructions are located on the last page of this paperwork. If you have not heard from [place] regarding the results in 2 weeks, please contact this office.      Hypertension Hypertension is another name for high blood pressure. High blood pressure forces your heart to work harder to pump blood. This can cause problems over time. There are two numbers in a blood pressure reading. There is a top number (systolic) over a bottom number (diastolic). It is best to have a blood pressure below 120/80. Healthy choices can help lower your blood pressure. You may need medicine to help lower your blood pressure if:  Your blood pressure cannot be lowered with healthy choices.  Your blood pressure is higher than 130/80. Follow these instructions at home: Eating and drinking   If directed, follow the DASH eating plan. This diet includes:  Filling half of your plate at each meal with fruits and vegetables.  Filling one quarter of your plate at each meal with whole grains. Whole grains include whole wheat pasta, brown rice, and whole grain bread.  Eating or drinking low-fat dairy products, such as skim milk or low-fat yogurt.  Filling one quarter of your plate at each meal with low-fat (lean) proteins. Low-fat proteins include fish, skinless chicken, eggs, beans, and tofu.  Avoiding fatty meat, cured and processed meat, or chicken with skin.  Avoiding premade or processed food.  Eat less than 1,500 mg of salt (sodium) a  day.  Limit alcohol use to no more than 1 drink a day for nonpregnant women and 2 drinks a day for men. One drink equals 12 oz of beer, 5 oz of wine, or 1 oz of hard liquor. Lifestyle   Work with your doctor to stay at a healthy weight or to lose weight. Ask your doctor what the best weight is for you.  Get at least 30 minutes of exercise that causes your heart to beat faster (aerobic exercise) most days of the week. This may include walking, swimming, or biking.  Get at least 30 minutes of exercise that strengthens your muscles (resistance exercise) at least 3 days a week. This may include lifting weights or pilates.  Do not use any products that contain nicotine or tobacco. This includes cigarettes and e-cigarettes. If you need help quitting, ask your doctor.  Check your blood pressure at home as told by your doctor.  Keep all follow-up visits as told by your doctor. This is important.  Medicines   Take over-the-counter and prescription medicines only as told by your doctor. Follow directions carefully.  Do not skip doses of blood pressure medicine. The medicine does not work as well if you skip doses. Skipping doses also puts you at risk for problems.  Ask your doctor about side effects or reactions to medicines that you should watch for. Contact a doctor if:  You think you are having a reaction to the medicine you are taking.  You have headaches that keep coming back (recurring).  You feel dizzy.  You have swelling in your ankles.  You have trouble with your vision. Get help right away if:  You get a very bad headache.  You start to feel confused.  You feel weak or numb.  You feel faint.  You get very bad pain in your:  Chest.  Belly (abdomen).  You throw up (vomit) more than once.  You have trouble breathing. Summary  Hypertension is another name for high blood pressure.  Making healthy choices can help lower blood pressure. If your blood pressure cannot  be controlled with healthy choices, you may need to take medicine. This information is not intended to replace advice given to you by your health care provider. Make sure you discuss any questions you have with your health care provider. Document Released: 08/21/2007 Document Revised: 01/31/2016 Document Reviewed: 01/31/2016 Elsevier Interactive Patient Education  2017 [place] Seizures, Adult A seizure can cause:  Involuntary movements, like falling or shaking.  Changes in awareness or consciousness.  Convulsions. These are episodes of uncontrollable, jerking movement caused by sudden, intense tightening (contraction) of the muscles. Epileptic seizures are caused by abnormal electrical activity in the brain. Non-epileptic seizures are different. They are not caused by abnormal electrical signals in your brain. These seizures may look like epileptic seizures, but they are not caused by epilepsy. There are two types of non-epileptic seizures:  Physiologic non-epileptic seizure. This type results from an underlying problem that causes a disruption in your brain's electrical activity.  Psychogenic non-epileptic seizure. This type results from emotional stress. These seizures are sometimes called pseudoseizures. What are the causes? Causes of physiologic non-epileptic seizures can include:  Sudden drop in blood pressure.  Low blood sugar (glucose).  Low levels of salt (sodium) in your blood.  Low levels of calcium in your blood.  Migraine.  Heart rhythm problems.  Sleep disorders, such as narcolepsy.  Movement disorders, such as Tourette syndrome.  Infection.  Certain medicines.  Drug and alcohol abuse.  Fever. Common causes of psychogenic non-epileptic seizures can include:  Stress.  Emotional trauma.  Sexual or physical abuse.  Major life events, such as divorce or death of a loved one.  Mental health disorders, including anxiety and  depression. What are the signs or symptoms? Symptoms of a non-epileptic seizure can be similar to those of an epileptic seizure, which may include:  A change in attention or behavior (altered mental status).  Loss of consciousness or fainting.  Convulsions with rhythmic jerking movements.  Drooling.  Rapid eye movements.  Grunting.  Loss of bladder control and bowel control.  Bitter taste in the mouth.  Tongue biting. Some people experience unusual sensations (aura) before having a seizure. These can include:  "Butterflies" in the stomach.  Abnormal smells or tastes.  A feeling of having had a new experience before (dj vu). After a non-epileptic seizure, you may have a headache or sore muscles or feel confused and sleepy. Non-epileptic seizures usually:  Do not cause physical injuries.  Start slowly.  Include crying or shrieking.  Last longer than 2 minutes.  Include pelvic thrusting. How is this diagnosed? Non-epileptic seizures may be diagnosed by:  Your medical history.  A physical exam.  Your symptoms.  Your health care provider may want to talk with your friends or relatives who have seen you have a seizure.  If possible, it is helpful if you write down your seizure activity, including what led up to the seizure, and share that information with your health care provider. You may also need to have tests to look for causes of physiologic non-epileptic seizures. These may include:  An electroencephalogram (EEG). This test measures electrical activity in your brain. If you have had a non-epileptic seizure, the results of your EEG will likely be normal.  Video EEG. This test takes place in the hospital over the course of 2-7 days. The test uses a video camera and an EEG to monitor your symptoms and the electrical activity in your brain.  Blood tests.  Lumbar puncture. This test involves pulling fluid from your spine to check for  infection.  Electrocardiogram (ECG or EKG). This test checks for an abnormal heart rhythm.  CT scan. If your health care provider thinks you have had a psychogenic non-epileptic seizure, you may need to see a mental health specialist for an evaluation. How is this treated? The treatment for your seizures will depend on what is causing them. When the underlying condition is treated, your seizures should stop. If your seizures are being caused by emotional trauma or stress, your health care provider may recommend that you see a mental health professional. Treatment may include:  Relaxation therapy or cognitive behavioral therapy.  Medicines to treat depression or anxiety.  Individual or family counseling. In some cases, you may have psychogenic seizures in addition to epileptic seizures. If this is the case, you may be prescribed medicine to help with the epileptic seizures. Follow these instructions at home: Home care will depend on the type of non-epileptic seizures that you have. In general:  Follow all instructions from your health care provider. These may include ways to prevent seizures and what to do if you have a seizure.  Take over-the-counter and prescription medicines only as told by your health care provider.  Keep all follow-up visits as told by your health care provider. This is important.  Make sure family members, friends, and coworkers are trained on how to help you if you have a seizure. If you have a seizure, they should:  Lay you on the ground to prevent a fall.  Place a pillow or piece of clothing under your head.  Loosen any clothing around your neck.  Turn you onto your side. If vomiting occurs, this helps keep your airway clear.  Avoid any substances that may prevent your medicine from working properly. If you are prescribed medicine for seizures:  Do not use recreational drugs.  Limit or avoid alcoholic beverages. Contact a health care provider  if:  Your seizures change or become more frequent.  You continue to have seizures after treatment. Get help right away if:  You injure yourself during a seizure.  You have one seizure after another.  You have trouble recovering from a seizure.  You have chest pain or trouble breathing.  You have a seizure that lasts longer than 5 minutes. Summary  Non-epileptic seizures may look like epileptic seizures, but they are not caused by epilepsy.  The treatment  for your seizures will depend on what is causing them. When the underlying condition is treated, your seizures should stop.  Make sure family members, friends, and coworkers are trained on how to help you if you have a seizure. If you have a seizure, they should lay you on the ground to prevent a fall, protect your head and neck, and turn you onto your side. This information is not intended to replace advice given to you by your health care provider. Make sure you discuss any questions you have with your health care provider. Document Released: 04/19/2005 Document Revised: 12/15/2015 Document Reviewed: 12/15/2015 Elsevier Interactive Patient Education  2017 Elsevier Inc.      Agustina Caroli, MD [facility]

## 2016-05-23 ENCOUNTER — Other Ambulatory Visit: Payer: Self-pay | Admitting: Emergency Medicine

## 2016-05-23 DIAGNOSIS — R768 Other specified abnormal immunological findings in serum: Secondary | ICD-10-CM

## 2016-05-23 LAB — COMPREHENSIVE METABOLIC PANEL
ALT: 39 IU/L (ref 0–44)
AST: 40 IU/L (ref 0–40)
Albumin/Globulin Ratio: 1.4 (ref 1.2–2.2)
Albumin: 4.1 g/dL (ref 3.6–4.8)
Alkaline Phosphatase: 77 IU/L (ref 39–117)
BUN/Creatinine Ratio: 13 (ref 10–24)
BUN: 12 mg/dL (ref 8–27)
Bilirubin Total: 0.6 mg/dL (ref 0.0–1.2)
CO2: 23 mmol/L (ref 18–29)
Calcium: 9.1 mg/dL (ref 8.6–10.2)
Chloride: 99 mmol/L (ref 96–106)
Creatinine, Ser: 0.92 mg/dL (ref 0.76–1.27)
GFR calc Af Amer: 100 mL/min/{1.73_m2} (ref >59)
GFR calc non Af Amer: 86 mL/min/{1.73_m2} (ref >59)
Globulin, Total: 3 g/dL (ref 1.5–4.5)
Glucose: 87 mg/dL (ref 65–99)
Potassium: 4.1 mmol/L (ref 3.5–5.2)
Sodium: 139 mmol/L (ref 134–144)
Total Protein: 7.1 g/dL (ref 6.0–8.5)

## 2016-05-23 LAB — CBC WITH DIFFERENTIAL/PLATELET
Basophils Absolute: 0 10*3/uL (ref 0.0–0.2)
Basos: 0 %
EOS (ABSOLUTE): 0.3 10*3/uL (ref 0.0–0.4)
Eos: 4 %
Hematocrit: 45.8 % (ref 37.5–51.0)
Hemoglobin: 15.9 g/dL (ref 13.0–17.7)
Immature Grans (Abs): 0 10*3/uL (ref 0.0–0.1)
Immature Granulocytes: 0 %
Lymphocytes Absolute: 3.9 10*3/uL — ABNORMAL HIGH (ref 0.7–3.1)
Lymphs: 49 %
MCH: 31.7 pg (ref 26.6–33.0)
MCHC: 34.7 g/dL (ref 31.5–35.7)
MCV: 91 fL (ref 79–97)
Monocytes Absolute: 0.4 10*3/uL (ref 0.1–0.9)
Monocytes: 5 %
Neutrophils Absolute: 3.3 10*3/uL (ref 1.4–7.0)
Neutrophils: 42 %
Platelets: 174 10*3/uL (ref 150–379)
RBC: 5.01 x10E6/uL (ref 4.14–5.80)
RDW: 14.6 % (ref 12.3–15.4)
WBC: 8 10*3/uL (ref 3.4–10.8)

## 2016-05-23 LAB — HEPATITIS C ANTIBODY: Hep C Virus Ab: >11 s/co ratio — ABNORMAL HIGH (ref 0.0–0.9)

## 2016-06-25 ENCOUNTER — Telehealth: Payer: Self-pay | Admitting: Emergency Medicine

## 2016-06-25 NOTE — Telephone Encounter (Signed)
Pt's brother [name] calling about gabapentin and another prescription but he could not remember the name. Thinks pharmacy sent over a refill request to [place] and are waiting on that to fill his prescriptions. [facility] pharmacy will be sending over another request. Please advise. Pt brother's number is [phone]. Pt's brother is on [place].

## 2016-06-28 NOTE — Telephone Encounter (Signed)
Pt came in checking on status. Please advise.

## 2016-07-01 ENCOUNTER — Ambulatory Visit (INDEPENDENT_AMBULATORY_CARE_PROVIDER_SITE_OTHER): Payer: PPO | Admitting: Family Medicine

## 2016-07-01 VITALS — BP 170/100 | HR 65 | Temp 97.8°F | Resp 16 | Ht 66.75 in | Wt 165.0 lb

## 2016-07-01 DIAGNOSIS — M79672 Pain in left foot: Secondary | ICD-10-CM

## 2016-07-01 DIAGNOSIS — M79671 Pain in right foot: Secondary | ICD-10-CM

## 2016-07-01 DIAGNOSIS — R569 Unspecified convulsions: Secondary | ICD-10-CM

## 2016-07-01 DIAGNOSIS — I10 Essential (primary) hypertension: Secondary | ICD-10-CM | POA: Diagnosis not present

## 2016-07-01 MED ORDER — LISINOPRIL-HYDROCHLOROTHIAZIDE 10-12.5 MG PO TABS
1.0000 | ORAL_TABLET | Freq: Every day | ORAL | 1 refills | Status: DC
Start: 2016-07-01 — End: 2016-12-15

## 2016-07-01 MED ORDER — GABAPENTIN 100 MG PO CAPS: 100.0000 mg | ORAL_CAPSULE | Freq: Every day | ORAL | 1 refills | Status: DC | PRN

## 2016-07-01 NOTE — Patient Instructions (Addendum)
For seizures, it appears you are only supposed to be on the lacosamide. Please verify this with your neurologist   Refilled blood pressure meds a same doses. Keep a record of your blood pressures outside of the office and if remaining over 140/90 in the next 10 days - return to adjust medications.  Ok to use gabapentin if needed for feet, but if pain in calves with walking or worsening pain - return to discuss further.   IF you received an x-ray today, you will receive an invoice from [facility]. Please contact [facility] at [phone] with questions or concerns regarding your invoice.   IF you received labwork today, you will receive an invoice from [place]. Please contact [facility] at [phone] with questions or concerns regarding your invoice.   Our billing staff will not be able to assist you with questions regarding bills from these companies.  You will be contacted with the lab results as soon as they are available. The fastest way to get your results is to activate your My Chart account. Instructions are located on the last page of this paperwork. If you have not heard from [place] regarding the results in 2 weeks, please contact this office.

## 2016-07-01 NOTE — Progress Notes (Signed)
By signing my name below, I, [name], attest that this documentation has been prepared under the direction and in the presence of [name], MD.  Electronically Signed: [name], Medical Scribe. 07/01/16. 9:40 AM.  Subjective:    Patient ID: Jeffrey Serrano, male    DOB: 1950-12-01, 66 y.o.   MRN: [id number]  HPI Chief Complaint  Patient presents with  . Medication Refill    gabapentin,lisinopril    HPI Comments: Jeffrey Serrano is a 66 y.o. male who presents to the Primary Care at [facility] for medication refill. He was last seen 05/22/16 by Dr. Mitchel Honour.  Seizure Disorder: Followed by Dr. [name] at [facility], last visit 11/2015. He has an appt scheduled 07/30/16 with Dr. [name].  Pt takes vimpat BID for his seizures and gabapentin for "poor circulation" in his feet. Pt reports having aches in both feet after walking long distances and can vacuum 2 floors without difficulty. Pain worsens the more he walks and is relieved when he stops walking - pt is unsure what the surgery was for. Reports having an operation on his testicles that relieved some of his foot pain. Denies chest pain, HA, SOB, melena, abdominal pain, or other negative side effects while on his medication.  HTN: Takes lisinopril- HCTZ 10/12.5 mg QD. BP was 138/90 at last visit. CMP was nl. Pt ran out of his medication for about 2 weeks. Pt doesn't check his bp and doesn't have a bp cuff at home. Pt denies experiencing chest pain, HA, SOB, melena, abdominal pain, or any negative side effects from his medication when he was taking it.  Lab Results  Component Value Date   CREATININE 0.92 05/22/2016   BP Readings from Last 3 Encounters:  07/01/16 (!) 170/100  05/22/16 138/90  12/04/15 139/88   Hep C: Positive Hep C virus antibody on 05/22/16, he was referred to [facility] for infectious disease.  Patient Active Problem List   Diagnosis Date Noted  . Seizure disorder ([place]) 05/22/2016  .  DEPRESSION 09/10/2006  . HYPERTENSION 09/10/2006  . PERIPHERAL VASCULAR DISEASE 09/10/2006  . Seizures ([place]) 09/10/2006   Past Medical History:  Diagnosis Date  . Hypertension   . PVD (peripheral vascular disease) ([place])   . Seizures ([place])    History reviewed. No pertinent surgical history. No Known Allergies Prior to Admission medications   Medication Sig Start Date End Date Taking? Authorizing Provider  Cholecalciferol (VITAMIN D3) 1000 units CAPS Take by mouth.   Yes Historical Provider, MD  gabapentin (NEURONTIN) 100 MG capsule  10/17/15  Yes Historical Provider, MD  lisinopril-hydrochlorothiazide (PRINZIDE,ZESTORETIC) 10-12.5 MG tablet Take 1 tablet by mouth daily. 09/15/15  Yes Historical Provider, MD  phenytoin (DILANTIN) 100 MG ER capsule Take by mouth 3 (three) times daily.   Yes Historical Provider, MD  Lacosamide (VIMPAT) 100 MG TABS Take 1 tablet (100 mg total) by mouth 2 (two) times daily. Patient not taking: Reported on 07/01/2016 02/15/16   [name], MD   Social History   Social History  . Marital status: Legally Separated    Spouse name: N/A  . Number of children: N/A  . Years of education: N/A   Occupational History  . Not on file.   Social History Main Topics  . Smoking status: Never Smoker  . Smokeless tobacco: Never Used  . Alcohol use No  . Drug use: No  . Sexual activity: Not on file   Other Topics Concern  .  Not on file   Social History Narrative  . No narrative on file   Review of Systems  Constitutional: Negative for fatigue and unexpected weight change.  Eyes: Negative for visual disturbance.  Respiratory: Negative for cough, chest tightness and shortness of breath.   Cardiovascular: Negative for chest pain, palpitations and leg swelling.  Gastrointestinal: Negative for abdominal pain and blood in stool.  Neurological: Negative for dizziness, light-headedness and headaches.    Objective:  Physical Exam  Constitutional: He is oriented to  person, place, and time. He appears well-developed and well-nourished.  HENT:  Head: Normocephalic and atraumatic.  Eyes: EOM are normal. Pupils are equal, round, and reactive to light.  Neck: No JVD present. Carotid bruit is not present.  Cardiovascular: Normal rate, regular rhythm and normal heart sounds.  Exam reveals no gallop and no friction rub.   No murmur heard. Pulmonary/Chest: Effort normal and breath sounds normal. No respiratory distress. He has no wheezes. He has no rales.  Abdominal: There is no tenderness.  Musculoskeletal: He exhibits no edema (lower extremity).  Neurological: He is alert and oriented to person, place, and time.  Skin: Skin is warm and dry.  Psychiatric: He has a normal mood and affect.  Vitals reviewed.   Vitals:   07/01/16 0910 07/01/16 0931  BP: (!) 178/107 (!) 170/100  Pulse: 65   Resp: 16   Temp: 97.8 F (36.6 C)   TempSrc: Oral   SpO2: 99%   Weight: 165 lb (74.8 kg)   Height: 5' 6.75" (1.695 m)   Body mass index is 26.04 kg/m. Assessment & Plan:   Jeffrey Serrano is a 66 y.o. male Essential hypertension  - Elevated off of medication. Restart lisinopril/HCTZ at previous doses. Check outside blood pressures and if remaining over 14090 in the next 10 days, return to discuss medication changes.  Seizures ([place]) -   -Continue follow-up with neurology. It appears that he is supposed to be on Vimpat only, not Dilantin. Advised him to discuss with his neurologist to verify correct medications.  Foot pain, bilateral  - Episodic, nose with excessive walking. Denies other foot/leg claudication symptoms. Has had relief with use of gabapentin low-dose up to once per day in the past. Refilled gabapentin the same dose of 100 mg daily when necessary. RTC precautions if worsening.   Meds ordered this encounter  Medications  . phenytoin (DILANTIN) 100 MG ER capsule    Sig: Take by mouth 3 (three) times daily.  [name] gabapentin (NEURONTIN) 100 MG capsule     Sig: Take 1 capsule (100 mg total) by mouth daily as needed.    Dispense:  90 capsule    Refill:  1  . lisinopril-hydrochlorothiazide (PRINZIDE,ZESTORETIC) 10-12.5 MG tablet    Sig: Take 1 tablet by mouth daily.    Dispense:  90 tablet    Refill:  1   Patient Instructions   For seizures, it appears you are only supposed to be on the lacosamide. Please verify this with your neurologist   Refilled blood pressure meds a same doses. Keep a record of your blood pressures outside of the office and if remaining over 140/90 in the next 10 days - return to adjust medications.  Ok to use gabapentin if needed for feet, but if pain in calves with walking or worsening pain - return to discuss further.   IF you received an x-ray today, you will receive an invoice from [facility]. Please contact [facility] at [phone] with questions  or concerns regarding your invoice.   IF you received labwork today, you will receive an invoice from [place] [facility]. Please contact [facility] at [phone] with questions or concerns regarding your invoice.   Our billing staff will not be able to assist you with questions regarding bills from these companies.  You will be contacted with the lab results as soon as they are available. The fastest way to get your results is to activate your My Chart account. Instructions are located on the last page of this paperwork. If you have not heard from [place] regarding the results in 2 weeks, please contact this office.       I personally performed the services described in this documentation, which was scribed in my presence. The recorded information has been reviewed and considered for accuracy and completeness, addended by me as needed, and agree with information above.  Signed,   [name], MD Primary Care at [place].  07/01/16 10:11 AM

## 2016-07-02 NOTE — Telephone Encounter (Signed)
Seen yesterday and refills given

## 2016-07-04 ENCOUNTER — Other Ambulatory Visit: Payer: PPO

## 2016-07-29 ENCOUNTER — Telehealth: Payer: Self-pay | Admitting: General Practice

## 2016-07-29 NOTE — Telephone Encounter (Signed)
Pt is needing to get his dilantin refilled   Best number [phone]

## 2016-07-29 NOTE — Telephone Encounter (Signed)
Are you managing or neuro?  Last ov 05/2016

## 2016-07-29 NOTE — Telephone Encounter (Signed)
This should come from neuro,and there was some question regarding use of this with Vimpat at last visit. Patient should clarify with Neuro.

## 2016-07-30 ENCOUNTER — Ambulatory Visit: Payer: Medicare HMO | Admitting: Neurology

## 2016-08-01 NOTE — Addendum Note (Signed)
Addended by: [name] on: 08/01/2016 12:08 PM   Modules accepted: Orders

## 2016-08-01 NOTE — Telephone Encounter (Signed)
This is Dr. [name] nurse Audris Speaker. PT was told to stop dilantin last year in 10/2015. He is to only take vimpat for his seizures. Rn call patients brother [name] today to verify this. Its on the patients last office to only continue vimpat. [name] stated he explain to his brother that he is to only take vimpat only. [name] patients brother stated his brother does forget sometimes. Pt was trying to get refill from his PCP. Message will be sent to Dr. [name] for review.

## 2016-08-01 NOTE — Telephone Encounter (Signed)
Called pt brother [name] and he clarified to me that pt is on vimpat 100mg  bid and he is not on dilantin. He has stopped dilantin after starting the vimpat. He said his brother sometimes confused and asked to refill the wrong medication, but he is sure that pt is not on dilantin anymore. Therefore, no need to refill dilantin anymore. I have removed dilantin from his medication list. Thanks.   [name], MD PhD Stroke Neurology 08/01/2016 12:07 PM

## 2016-08-07 ENCOUNTER — Encounter: Payer: Self-pay | Admitting: Internal Medicine

## 2016-08-07 ENCOUNTER — Ambulatory Visit (INDEPENDENT_AMBULATORY_CARE_PROVIDER_SITE_OTHER): Payer: PPO | Admitting: Internal Medicine

## 2016-08-07 DIAGNOSIS — Z23 Encounter for immunization: Secondary | ICD-10-CM

## 2016-08-07 DIAGNOSIS — B182 Chronic viral hepatitis C: Secondary | ICD-10-CM | POA: Diagnosis not present

## 2016-08-07 LAB — CBC WITH DIFFERENTIAL/PLATELET
Basophils Absolute: 0 cells/uL (ref 0–200)
Basophils Relative: 0 %
Eosinophils Absolute: 258 cells/uL (ref 15–500)
Eosinophils Relative: 2 %
HCT: 47.4 % (ref 38.5–50.0)
Hemoglobin: 16.2 g/dL (ref 13.2–17.1)
Lymphocytes Relative: 30 %
Lymphs Abs: 3870 cells/uL (ref 850–3900)
MCH: 30.7 pg (ref 27.0–33.0)
MCHC: 34.2 g/dL (ref 32.0–36.0)
MCV: 89.9 fL (ref 80.0–100.0)
MPV: 11.4 fL (ref 7.5–12.5)
Monocytes Absolute: 1161 cells/uL — ABNORMAL HIGH (ref 200–950)
Monocytes Relative: 9 %
Neutro Abs: 7611 cells/uL (ref 1500–7800)
Neutrophils Relative %: 59 %
Platelets: 215 10*3/uL (ref 140–400)
RBC: 5.27 MIL/uL (ref 4.20–5.80)
RDW: 13.7 % (ref 11.0–15.0)
WBC: 12.9 10*3/uL — ABNORMAL HIGH (ref 3.8–10.8)

## 2016-08-07 LAB — COMPLETE METABOLIC PANEL WITH GFR
ALT: 41 U/L (ref 9–46)
AST: 37 U/L — ABNORMAL HIGH (ref 10–35)
Albumin: 4 g/dL (ref 3.6–5.1)
Alkaline Phosphatase: 71 U/L (ref 40–115)
BUN: 23 mg/dL (ref 7–25)
CO2: 21 mmol/L (ref 20–31)
Calcium: 9.7 mg/dL (ref 8.6–10.3)
Chloride: 99 mmol/L (ref 98–110)
Creat: 1.61 mg/dL — ABNORMAL HIGH (ref 0.70–1.25)
GFR, Est African American: 50 mL/min — ABNORMAL LOW (ref >=60)
GFR, Est Non African American: 44 mL/min — ABNORMAL LOW (ref >=60)
Glucose, Bld: 95 mg/dL (ref 65–99)
Potassium: 3.6 mmol/L (ref 3.5–5.3)
Sodium: 136 mmol/L (ref 135–146)
Total Bilirubin: 0.9 mg/dL (ref 0.2–1.2)
Total Protein: 7.2 g/dL (ref 6.1–8.1)

## 2016-08-07 NOTE — Addendum Note (Signed)
Addended by: Myrtis Hopping A on: 08/07/2016 04:24 PM   Modules accepted: Orders

## 2016-08-07 NOTE — Addendum Note (Signed)
Addended by: Myrtis Hopping A on: 08/07/2016 04:22 PM   Modules accepted: Orders

## 2016-08-07 NOTE — Progress Notes (Signed)
[facility] for Infectious Disease   CC: consideration for treatment for chronic hepatitis C  HPI:  +Jeffrey Serrano is a 66 y.o. male who presents for initial evaluation and management of chronic hepatitis C.  Patient tested positive earlier this year during routine screening. Hepatitis C-associated risk factors present are: none. Patient denies history of blood transfusion, intranasal drug use, IV drug abuse, renal dialysis, sexual contact with person with liver disease, tattoos. Patient has had other studies performed. Results: only an antibody. Patient has not had prior treatment for Hepatitis C. Patient does not have a past history of liver disease. Patient does not have a family history of liver disease. Patient does not  have associated signs or symptoms related to liver disease.  Labs reviewed and confirm chronic hepatitis C antibody.      Patient does not have documented immunity to Hepatitis A. Patient does not have documented immunity to Hepatitis B.    Review of Systems:  Constitutional: negative for fatigue, malaise and anorexia Integument/breast: negative for rash Musculoskeletal: negative for myalgias and arthralgias All other systems reviewed and are negative       Past Medical History:  Diagnosis Date  . Hypertension   . PVD (peripheral vascular disease) ([place])   . Seizures ([place])     Prior to Admission medications   Medication Sig Start Date End Date Taking? Authorizing Provider  Cholecalciferol (VITAMIN D3) 1000 units CAPS Take by mouth.   Yes [provider]  gabapentin (NEURONTIN) 100 MG capsule Take 1 capsule (100 mg total) by mouth daily as needed. 07/01/16  Yes Wendie Agreste, MD  Lacosamide (VIMPAT) 100 MG TABS Take 1 tablet (100 mg total) by mouth 2 (two) times daily. 02/15/16  Yes [name], MD  lisinopril-hydrochlorothiazide (PRINZIDE,ZESTORETIC) 10-12.5 MG tablet Take 1 tablet by mouth daily. 07/01/16  Yes Wendie Agreste, MD    No  Known Allergies  Social History  Substance Use Topics  . Smoking status: Never Smoker  . Smokeless tobacco: Never Used  . Alcohol use No    Family History  Problem Relation Age of Onset  . Seizures Father   no cirrhosis, no liver cancer   Objective:  Constitutional: in no apparent distress,  Vitals:   08/07/16 1336  BP: 116/75  Pulse: 92  Temp: 98 F (36.7 C)   Eyes: anicteric Cardiovascular: Cor RRR Respiratory: CTA B; normal respiratory effort Gastrointestinal: Bowel sounds are normal, liver is not enlarged, spleen is not enlarged, soft, nt Musculoskeletal: no pedal edema noted Skin: negatives: no rash; no porphyria cutanea tarda Lymphatic: no cervical lymphadenopathy   Laboratory Genotype: No results found for: HCVGENOTYPE HCV viral load: No results found for: HCVQUANT Lab Results  Component Value Date   WBC 8.0 05/22/2016   HGB 16.0 05/10/2013   HCT 45.8 05/22/2016   MCV 91 05/22/2016   PLT 174 05/22/2016    Lab Results  Component Value Date   CREATININE 0.92 05/22/2016   BUN 12 05/22/2016   NA 139 05/22/2016   K 4.1 05/22/2016   CL 99 05/22/2016   CO2 23 05/22/2016    Lab Results  Component Value Date   ALT 39 05/22/2016   AST 40 05/22/2016   ALKPHOS 77 05/22/2016     Labs and history reviewed and show CHILD-PUGH unknown  5-6 points: Child class A 7-9 points: Child class B 10-15 points: Child class C  Lab Results  Component Value Date   BILITOT 0.6 05/22/2016  ALBUMIN 4.1 05/22/2016     Assessment: New Patient with Chronic Hepatitis C genotype unknown, untreated.  I discussed with the patient the lab findings that confirm chronic hepatitis C as well as the natural history and progression of disease including about 30% of people who develop cirrhosis of the liver if left untreated and once cirrhosis is established there is a 2-7% risk per year of liver cancer and liver failure.  I discussed the importance of treatment and benefits in  reducing the risk, even if significant liver fibrosis exists.   Plan: 1) Patient counseled extensively on limiting acetaminophen to no more than 2 grams daily, avoidance of alcohol. 2) Transmission discussed with patient including sexual transmission, sharing razors and toothbrush.   3) Will need referral to gastroenterology if concern for cirrhosis 4) Will need referral for substance abuse counseling: No.; Further work up to include urine drug screen  No. 5) Will prescribe appropriate medication based on genotype and coverage (medicare [employer]) - likely can do Mavyret unless decompensated liver disease  6) Hepatitis A and B titers 7) Pneumovax vaccine today 9) Further work up to include liver staging with elastography 10) will follow up after starting medication once above work up completed

## 2016-08-07 NOTE — Patient Instructions (Signed)
Date 08/07/16  Dear Mr Kernen, As discussed in the [facility], your hepatitis C therapy will include highly effective medication(s) for treatment and will vary based on the type of hepatitis C and insurance approval.  Potential medications include:          Harvoni (sofosbuvir 90mg /ledipasvir 400mg ) tablet oral daily          OR     Epclusa (sofosbuvir 400mg /velpatasvir 100mg ) tablet oral daily          OR      Mavyret (glecaprevir 100 mg/pibrentasvir 40 mg): Take 3 tablets oral daily          OR     Zepatier (elbasvir 50 mg/grazoprevir 100 mg) oral daily, +/- ribavirin              Medications are typically for 8 or 12 weeks total ---------------------------------------------------------------- Your HCV Treatment Start Date: You will be notified by our office once the medication is approved and where you can pick it up (or if mailed)   ---------------------------------------------------------------- [place]:   [facility] [place], [place] [address] Phone: [phone] Hours: Monday to Friday 7:30 am to 6:00 pm   Please always contact your pharmacy at least 3-4 business days before you run out of medications to ensure your next month's medication is ready or 1 week prior to running out if you receive it by mail.  Remember, each prescription is for 28 days. ---------------------------------------------------------------- GENERAL NOTES REGARDING YOUR HEPATITIS C MEDICATION:  Some medications have the following interactions:  - Acid reducing agents such as H2 blockers (ie. Pepcid (famotidine), Zantac (ranitidine), Tagamet (cimetidine), Axid (nizatidine) and proton pump inhibitors (ie. Prilosec (omeprazole), Protonix (pantoprazole), Nexium (esomeprazole), or Aciphex (rabeprazole)). Do not take until you have discussed with a health care provider.    -Antacids that contain magnesium and/or aluminum hydroxide (ie. Milk of [place], Rolaids,  Gaviscon, Maalox, Mylanta, an dArthritis Pain Formula).  -Calcium carbonate (calcium supplements or antacids such as Tums, Caltrate, Os-Cal).  -St. John's wort or any products that contain St. John's wort like some herbal supplements  Please inform the office prior to starting any of these medications.  - The common side effects associated with Harvoni include:      1. Fatigue      2. Headache      3. Nausea      4. Diarrhea      5. Insomnia  Please note that this only lists the most common side effects and is NOT a comprehensive list of the potential side effects of these medications. For more information, please review the drug information sheets that come with your medication package from the pharmacy.  ---------------------------------------------------------------- GENERAL HELPFUL HINTS ON HCV THERAPY: 1. Stay well-hydrated. 2. Notify the [facility] of any changes in your other over-the-counter/herbal or prescription medications. 3. If you miss a dose of your medication, take the missed dose as soon as you remember. Return to your regular time/dose schedule the next day.  4.  Do not stop taking your medications without first talking with your healthcare provider. 5.  You may take Tylenol (acetaminophen), as long as the dose is less than 2000 mg (OR no more than 4 tablets of the Tylenol Extra Strengths 500mg  tablet) in 24 hours. 6.  You will see our pharmacist-specialist within the first 2 weeks of starting your medication to monitor for any possible side effects. 7.  You will have labs once during treatment, soon  after treatment completion and one final lab 6 months after treatment completion to verify the virus is out of your system.  Scharlene Gloss, [place] for [place], [place]  [phone]

## 2016-08-08 LAB — HEPATITIS B SURFACE ANTIBODY,QUALITATIVE: Hep B S Ab: NEGATIVE

## 2016-08-08 LAB — HIV ANTIBODY (ROUTINE TESTING W REFLEX): HIV 1&2 Ab, 4th Generation: NONREACTIVE

## 2016-08-08 LAB — HEPATITIS B SURFACE ANTIGEN: Hepatitis B Surface Ag: NEGATIVE

## 2016-08-08 LAB — PROTIME-INR
INR: 1
Prothrombin Time: 11 s (ref 9.0–11.5)

## 2016-08-08 LAB — HEPATITIS B CORE ANTIBODY, TOTAL: Hep B Core Total Ab: NONREACTIVE

## 2016-08-08 LAB — HEPATITIS A ANTIBODY, TOTAL: Hep A Total Ab: NONREACTIVE

## 2016-08-11 ENCOUNTER — Other Ambulatory Visit: Payer: Self-pay | Admitting: Neurology

## 2016-08-11 DIAGNOSIS — R569 Unspecified convulsions: Secondary | ICD-10-CM

## 2016-08-11 LAB — HCV RNA, QN PCR RFLX GENO, LIPA
HCV RNA, PCR, QN: 1450000 IU/mL — ABNORMAL HIGH
HCV RNA, PCR, QN: 6.16 log IU/mL — ABNORMAL HIGH

## 2016-08-11 LAB — HEPATITIS C GENOTYPE

## 2016-08-13 ENCOUNTER — Other Ambulatory Visit: Payer: Self-pay

## 2016-08-13 DIAGNOSIS — R569 Unspecified convulsions: Secondary | ICD-10-CM

## 2016-08-13 MED ORDER — LACOSAMIDE 100 MG PO TABS: 1.0000 | ORAL_TABLET | Freq: Two times a day (BID) | ORAL | 3 refills | Status: DC

## 2016-08-14 ENCOUNTER — Encounter: Payer: PPO | Admitting: Internal Medicine

## 2016-08-15 ENCOUNTER — Telehealth: Payer: Self-pay | Admitting: *Deleted

## 2016-08-15 LAB — LIVER FIBROSIS, FIBROTEST-ACTITEST
ALT: 42 U/L (ref 9–46)
Alpha-2-Macroglobulin: 444 mg/dL — ABNORMAL HIGH (ref 106–279)
Apolipoprotein A1: 151 mg/dL (ref 94–176)
Bilirubin: 0.8 mg/dL (ref 0.2–1.2)
Fibrosis Score: 0.88
GGT: 211 U/L — ABNORMAL HIGH (ref 3–70)
Haptoglobin: 117 mg/dL (ref 43–212)
Necroinflammat ACT Score: 0.42
Reference ID: 1965665

## 2016-08-15 NOTE — Telephone Encounter (Signed)
Patient notified of appt for ultrasound at [facility] on 08/27/16 at 7:45 AM. Nothing to eat or drink after midnight. Myrtis Hopping CMA

## 2016-08-27 ENCOUNTER — Other Ambulatory Visit: Payer: Self-pay | Admitting: Internal Medicine

## 2016-08-27 ENCOUNTER — Ambulatory Visit (HOSPITAL_COMMUNITY)
Admission: RE | Admit: 2016-08-27 | Discharge: 2016-08-27 | Disposition: A | Discharge status: Home / Self Care | Payer: PPO | Source: Ambulatory Visit | Attending: Internal Medicine | Admitting: Internal Medicine

## 2016-08-27 DIAGNOSIS — B182 Chronic viral hepatitis C: Secondary | ICD-10-CM

## 2016-08-27 DIAGNOSIS — I700 Atherosclerosis of aorta: Secondary | ICD-10-CM | POA: Insufficient documentation

## 2016-08-27 MED ORDER — GLECAPREVIR-PIBRENTASVIR 100-40 MG PO TABS: 3.0000 | ORAL_TABLET | Freq: Every day | ORAL | 1 refills | Status: DC

## 2016-10-08 ENCOUNTER — Ambulatory Visit (INDEPENDENT_AMBULATORY_CARE_PROVIDER_SITE_OTHER): Payer: PPO | Admitting: Neurology

## 2016-10-08 ENCOUNTER — Encounter: Payer: Self-pay | Admitting: Neurology

## 2016-10-08 VITALS — BP 152/97 | HR 55 | Ht 66.5 in | Wt 158.8 lb

## 2016-10-08 DIAGNOSIS — R569 Unspecified convulsions: Secondary | ICD-10-CM

## 2016-10-08 DIAGNOSIS — B182 Chronic viral hepatitis C: Secondary | ICD-10-CM

## 2016-10-08 DIAGNOSIS — I10 Essential (primary) hypertension: Secondary | ICD-10-CM | POA: Diagnosis not present

## 2016-10-08 NOTE — Patient Instructions (Signed)
-   continue vimpat for seizure control.  - check BP at home and record and call Dr. Carlota Raspberry if you need BP meds adjustment - pt is not driving  - Seizure precautions - continue your other medications - follow up with PCP  - follow up in 6 months.

## 2016-10-08 NOTE — Progress Notes (Signed)
NEUROLOGY CLINIC FOLLOW UP NOTE  NAME: Jeffrey Serrano DOB: 10/22/1950  HPI: Jeffrey Serrano is a 66 y.o. male with PMH of Seizure disorder who presents as a new patient for Seizure. Patient complains by his brother, however both of them are poor historian, and not able to provide detailed information. However as per his brother, patient had history of a seizure disorder on Dilantin. About one month ago, he was getting out of the elevator, suddenly fell. Brother was able to hold him and lowered him to the ground. Jeffrey Serrano witnessed all 4 extremities shivering and stiffness, lasting about 2-3 minutes. Denies any tongue biting, urinary or bowel incontinence. After that episode, as per brother he woke up, without a confusion or any difficulty, and went on to work that day.  As per note, he had ED visit on 05/10/2013 due to seizure. 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   He and his brother cannot tell when he started to have seizure, when Dilantin was started and how often of the seizure. Patient only takes Dilantin 100 mg every day. However, the instruction written is for Dilantin 100 mg twice a day. Brother does note that patient had significant memory issues, not able to remember things, this is at least for about one year. However he cannot provide a further information and stated that patient wife may know it.  He denies any cigarette smoking, alcohol or illicit drugs. He is still working every day, however, he is not driving and he is taking bus to work.  12/04/15 follow up - pt has been doing well. No seizure episodes. Had EEG which was normal EEG. Has not have MRI done yet. On vimpat bid. Still on dilantin bid and asked pt to discontinue. Otherwise, no complains. He is not driving.  Interval history: During the interval time, pt has been doing well. No  more seizures. Had MRI brain with and without contrast unremarkable. Continued on vimpat 100mg  bid. He has to pay $285 for 3 months supply. He is so far able to afford. His BP today is high at 152/97. He is on lisinopril 10mg  and HCTZ 12.5mg . Encourage pt check BP at home.    Past Medical History:  Diagnosis Date  . Hypertension   . PVD (peripheral vascular disease) ([place])   . Seizures ([place])    History reviewed. No pertinent surgical history. Family History  Problem Relation Age of Onset  . Seizures Father    Current Outpatient Prescriptions  Medication Sig Dispense Refill  . Cholecalciferol (VITAMIN D3) 1000 units CAPS Take by mouth.    . gabapentin (NEURONTIN) 100 MG capsule Take 1 capsule (100 mg total) by mouth daily as needed. 90 capsule 1  . ibuprofen (ADVIL,MOTRIN) 200 MG tablet Take 200 mg by mouth every 6 (six) hours as needed.    . Lacosamide (VIMPAT) 100 MG TABS Take 1 tablet (100 mg total) by mouth 2 (two) times daily. 180 tablet 3  . lisinopril-hydrochlorothiazide (PRINZIDE,ZESTORETIC) 10-12.5 MG tablet Take 1 tablet by mouth daily. 90 tablet 1   No current facility-administered medications for this visit.    No Known Allergies Social History   Social History  . Marital status: Legally Separated    Spouse name: N/A  . Number of children: N/A  . Years of education: N/A  Occupational History  . Not on file.   Social History Main Topics  . Smoking status: Never Smoker  . Smokeless tobacco: Never Used  . Alcohol use No  . Drug use: No  . Sexual activity: Not on file   Other Topics Concern  . Not on file   Social History Narrative  . No narrative on file    Review of Systems Full 14 system review of systems performed and notable only for those listed, all others are neg:  Constitutional:   Cardiovascular:  Ear/Nose/Throat:   Skin:  Eyes:   Respiratory:   Gastroitestinal:   Genitourinary:  Hematology/Lymphatic:   Endocrine:  Musculoskeletal:     Allergy/Immunology:   Neurological:   Psychiatric:  Sleep:    Physical Exam  Vitals:   10/08/16 1103  BP: (!) 152/97  Pulse: (!) 55    General - Well nourished, well developed, in no apparent distress.  Ophthalmologic - Sharp disc margins OU.  Cardiovascular - Regular rate and rhythm with no murmur.   Neck - supple, no nuchal rigidity .  Mental Status -  Level of arousal and orientation to time, place, and person were intact. Language including expression, naming, repetition, comprehension, reading, and writing was assessed and found intact.  Cranial Nerves II - XII - II - Visual field intact OU. III, IV, VI - Extraocular movements intact. V - Facial sensation intact bilaterally. VII - Facial movement intact bilaterally. VIII - Hearing & vestibular intact bilaterally. X - Palate elevates symmetrically. XI - Chin turning & shoulder shrug intact bilaterally. XII - Tongue protrusion intact.  Motor Strength - The patient's strength was normal in all extremities and pronator drift was absent.  Bulk was normal and fasciculations were absent.   Motor Tone - Muscle tone was assessed at the neck and appendages and was normal.  Reflexes - The patient's reflexes were normal in all extremities and he had no pathological reflexes.  Sensory - Light touch, temperature/pinprick, vibration and proprioception, and Romberg testing were assessed and were normal.    Coordination - The patient had normal movements in the hands and feet with no ataxia or dysmetria.  Tremor was absent.  Gait and Station - The patient's transfers, posture, gait, station, and turns were observed as normal.   Imaging I have personally reviewed the radiological images below and agree with the radiology interpretations.  MRI brain 12/12/15 Mild to moderate generalized cortical atrophy.   Atrophy of the corpus callosum was also noted. 2.    Mild T2/FLAIR hyperintensity in the periatrial white matter  consistent with minimal chronic microvascular ischemic change, normal for age. 3.    There is a "empty sella". This could be idiopathic and also be seen with idiopathic intracranial hypertension. 4.    Mild chronic maxillary and ethmoid sinusitis.. 5.    There are no acute findings.  EEG - This is a normal EEG recording in the waking state. No evidence of ictal or interictal discharges are seen.  Lab Review none   Assessment:   In summary, Jeffrey Serrano is a 66 y.o. male with PMH of HepC and Seizure disorder follows up for seizure evaluation. Patient and his brother are both poor historian, not sure about when seizure started, when Dilantin was started, and how often of seizure. Apparently, he had seizure in 04/2013 and in 08/2015, during both seizures, he lost consciousness and fell. He takes Dilantin 100 mg every day only although instructions said 100 mg twice a day.  Due to the low dose, I assume the Dilantin level will be very subtherapeutic. Changed to vimpat 100mg  bid dosing. Had EEG normal and MRI unremarkable. No more seizures. He is not driving as he does not have a car.  Plan: - continue vimpat for seizure control.  - check BP at home and record and call Dr. Carlota Raspberry if you need BP meds adjustment - pt is not driving  - Seizure precautions - continue your other medications - follow up with PCP  - follow up in 6 months with Hoyle Sauer.   No orders of the defined types were placed in this encounter.   Meds ordered this encounter  Medications  . ibuprofen (ADVIL,MOTRIN) 200 MG tablet    Sig: Take 200 mg by mouth every 6 (six) hours as needed.    Patient Instructions  - continue vimpat for seizure control.  - check BP at home and record and call Dr. Carlota Raspberry if you need BP meds adjustment - pt is not driving  - Seizure precautions - continue your other medications - follow up with PCP  - follow up in 6 months.   [name], MD PhD [facility] [address] [place], [place] [address] [phone]

## 2016-11-12 ENCOUNTER — Telehealth: Payer: Self-pay

## 2016-11-12 NOTE — Telephone Encounter (Signed)
Called pt to schedule Medicare Annual Wellness Visit. Pt reported that they are moving to a different primary care office. -nr

## 2016-12-15 ENCOUNTER — Other Ambulatory Visit: Payer: Self-pay | Admitting: Family Medicine

## 2016-12-15 DIAGNOSIS — R569 Unspecified convulsions: Secondary | ICD-10-CM

## 2016-12-22 ENCOUNTER — Other Ambulatory Visit: Payer: Self-pay | Admitting: Family Medicine

## 2016-12-22 DIAGNOSIS — R569 Unspecified convulsions: Secondary | ICD-10-CM

## 2017-01-16 ENCOUNTER — Ambulatory Visit (INDEPENDENT_AMBULATORY_CARE_PROVIDER_SITE_OTHER): Payer: PPO | Admitting: Family Medicine

## 2017-01-16 ENCOUNTER — Encounter: Payer: Self-pay | Admitting: Family Medicine

## 2017-01-16 VITALS — BP 138/86 | HR 72 | Temp 98.3°F | Resp 16 | Ht 66.5 in | Wt 157.4 lb

## 2017-01-16 DIAGNOSIS — Z13220 Encounter for screening for lipoid disorders: Secondary | ICD-10-CM | POA: Diagnosis not present

## 2017-01-16 DIAGNOSIS — I10 Essential (primary) hypertension: Secondary | ICD-10-CM | POA: Diagnosis not present

## 2017-01-16 DIAGNOSIS — G5790 Unspecified mononeuropathy of unspecified lower limb: Secondary | ICD-10-CM | POA: Diagnosis not present

## 2017-01-16 DIAGNOSIS — M792 Neuralgia and neuritis, unspecified: Secondary | ICD-10-CM

## 2017-01-16 MED ORDER — LISINOPRIL-HYDROCHLOROTHIAZIDE 10-12.5 MG PO TABS
1.0000 | ORAL_TABLET | Freq: Every day | ORAL | 1 refills | Status: DC
Start: 2017-01-16 — End: 2017-07-10

## 2017-01-16 MED ORDER — GABAPENTIN 100 MG PO CAPS: 100.0000 mg | ORAL_CAPSULE | Freq: Every day | ORAL | 1 refills | Status: DC | PRN

## 2017-01-16 NOTE — Patient Instructions (Signed)
No change in medications at this time. Keep upcoming follow-up appointment for your physical. Will follow-up on the blood pressure and gabapentin in 6 months. Monitor your blood pressure outside of the office, ideally that should be less than 130/80, but if it is running over 140/90 please return to discuss change in medication.    IF you received an x-ray today, you will receive an invoice from [facility]. Please contact [facility] at [phone] with questions or concerns regarding your invoice.   IF you received labwork today, you will receive an invoice from [facility]. Please contact [facility] at [phone] with questions or concerns regarding your invoice.   Our billing staff will not be able to assist you with questions regarding bills from these companies.  You will be contacted with the lab results as soon as they are available. The fastest way to get your results is to activate your My Chart account. Instructions are located on the last page of this paperwork. If you have not heard from [place] regarding the results in 2 weeks, please contact this office.

## 2017-01-16 NOTE — Progress Notes (Signed)
Subjective:  By signing my name below, I, [name], attest that this documentation has been prepared under the direction and in the presence of [name], MD. Electronically Signed: [name], [place]. 01/16/2017 , 11:17 AM .  Patient was seen in Room 14 .   Patient ID: Jeffrey Serrano, male    DOB: 1950-12-05, 66 y.o.   MRN: [id number] Chief Complaint  Patient presents with  . Medication Refill    patient would like refill on Lisinopril   HPI Jeffrey Serrano is a 66 y.o. male Here for medication refill. He's fasting this morning.   HTN He takes Lisinopril-HCTZ 10-12.5mg . He was uncontrolled at April visit, but he had ran out of medications for 2 weeks. He's been checking his BP at home periodically, running <140 / <90. He mentions he hasn't taken his medication this morning. He denies any side effects with his medication. He denies chest pain, lightheadedness, dizziness, shortness of breath, blood in stool, or dark, tarry stool.   History of seizure disorder He's treated by Dr. [name] with neurology with last office visit on July 24th. He was changed to Vimpat 100mg  BID. EEG and MRI were unremarkable, plan for follow up in 6 months. He mentions the Vimpat is expensive and will contact Dr. [name] regarding it.   Episodic foot pain See last office visit in April with possible neuropathy. Pain improved with episodic gabapentin 100mg . He states his foot pain worsens if he walks more; but he states pain improved with taking gabapentin at night.   Patient Active Problem List   Diagnosis Date Noted  . Chronic hepatitis C without hepatic coma ([place]) 08/07/2016  . Seizure disorder ([place]) 05/22/2016  . DEPRESSION 09/10/2006  . Essential hypertension 09/10/2006  . PERIPHERAL VASCULAR DISEASE 09/10/2006  . Seizures ([place]) 09/10/2006   Past Medical History:  Diagnosis Date  . Hypertension   . PVD (peripheral vascular disease) ([place])   . Seizures ([place])    No past surgical history on  file. No Known Allergies Prior to Admission medications   Medication Sig Start Date End Date Taking? Authorizing Provider  Cholecalciferol (VITAMIN D3) 1000 units CAPS Take by mouth.    [provider]  gabapentin (NEURONTIN) 100 MG capsule Take 1 capsule (100 mg total) by mouth daily as needed. 07/01/16   Wendie Agreste, MD  ibuprofen (ADVIL,MOTRIN) 200 MG tablet Take 200 mg by mouth every 6 (six) hours as needed.    [provider]  Lacosamide (VIMPAT) 100 MG TABS Take 1 tablet (100 mg total) by mouth 2 (two) times daily. 08/13/16   [name], MD  lisinopril-hydrochlorothiazide [facility]) 10-12.5 MG tablet take 1 tablet by mouth once daily 12/16/16   Wendie Agreste, MD  lisinopril-hydrochlorothiazide [facility]) 10-12.5 MG tablet take 1 tablet by mouth once daily 12/23/16   Wendie Agreste, MD   Social History   Social History  . Marital status: Legally Separated    Spouse name: N/A  . Number of children: N/A  . Years of education: N/A   Occupational History  . Not on file.   Social History Main Topics  . Smoking status: Never Smoker  . Smokeless tobacco: Never Used  . Alcohol use No  . Drug use: No  . Sexual activity: Not on file   Other Topics Concern  . Not on file   Social History Narrative  . No narrative on file   Review of Systems  Constitutional: Negative for fatigue and unexpected weight  change.  Eyes: Negative for visual disturbance.  Respiratory: Negative for cough, chest tightness and shortness of breath.   Cardiovascular: Negative for chest pain, palpitations and leg swelling.  Gastrointestinal: Negative for abdominal pain and blood in stool.  Neurological: Negative for dizziness, light-headedness and headaches.       Objective:   Physical Exam  Constitutional: He is oriented to person, place, and time. He appears well-developed and well-nourished.  HENT:  Head: Normocephalic and atraumatic.  Eyes: Pupils  are equal, round, and reactive to light. EOM are normal.  Neck: No JVD present. Carotid bruit is not present.  Cardiovascular: Normal rate, regular rhythm and normal heart sounds.   No murmur heard. Pulmonary/Chest: Effort normal and breath sounds normal. He has no rales.  Musculoskeletal: He exhibits no edema.  Neurological: He is alert and oriented to person, place, and time.  Skin: Skin is warm and dry.  Psychiatric: He has a normal mood and affect.  Vitals reviewed.   Vitals:   01/16/17 1038  BP: 138/86  Pulse: 72  Resp: 16  Temp: 98.3 F (36.8 C)  TempSrc: Oral  SpO2: 99%  Weight: 157 lb 6.4 oz (71.4 kg)  Height: 5' 6.5" (1.689 m)      Assessment & Plan:   Jeffrey Serrano is a 66 y.o. male Essential hypertension - Plan: lisinopril-hydrochlorothiazide (PRINZIDE,ZESTORETIC) 10-12.5 MG tablet, Comprehensive metabolic panel  - Borderline but control. Labs pending. Monitor readings and if remaining over 140/90, return for adjustment. Ideal goal less than 130/80  Neuropathic pain of foot, unspecified laterality - Plan: gabapentin (NEURONTIN) 100 MG capsule  - Stable with episodic gabapentin. Refilled  Screening for hyperlipidemia - Plan: Comprehensive metabolic panel, Lipid panel  Recommended discussing his seizure medication with neurology to look into lower-cost options.  Meds ordered this encounter  Medications  . gabapentin (NEURONTIN) 100 MG capsule    Sig: Take 1 capsule (100 mg total) by mouth daily as needed.    Dispense:  90 capsule    Refill:  1  . lisinopril-hydrochlorothiazide (PRINZIDE,ZESTORETIC) 10-12.5 MG tablet    Sig: Take 1 tablet by mouth daily.    Dispense:  90 tablet    Refill:  1   Patient Instructions   No change in medications at this time. Keep upcoming follow-up appointment for your physical. Will follow-up on the blood pressure and gabapentin in 6 months. Monitor your blood pressure outside of the office, ideally that should be less than  130/80, but if it is running over 140/90 please return to discuss change in medication.    IF you received an x-ray today, you will receive an invoice from [facility]. Please contact [facility] at [phone] with questions or concerns regarding your invoice.   IF you received labwork today, you will receive an invoice from [place]. Please contact [facility] at [phone] with questions or concerns regarding your invoice.   Our billing staff will not be able to assist you with questions regarding bills from these companies.  You will be contacted with the lab results as soon as they are available. The fastest way to get your results is to activate your My Chart account. Instructions are located on the last page of this paperwork. If you have not heard from [place] regarding the results in 2 weeks, please contact this office.       I personally performed the services described in this documentation, which was scribed in my presence. The recorded information has been reviewed and considered for  accuracy and completeness, addended by me as needed, and agree with information above.  Signed,   [name], MD [facility].  01/16/17 11:26 AM

## 2017-01-17 LAB — COMPREHENSIVE METABOLIC PANEL
ALT: 22 IU/L (ref 0–44)
AST: 29 IU/L (ref 0–40)
Albumin/Globulin Ratio: 1.3 (ref 1.2–2.2)
Albumin: 4.3 g/dL (ref 3.6–4.8)
Alkaline Phosphatase: 63 IU/L (ref 39–117)
BUN/Creatinine Ratio: 12 (ref 10–24)
BUN: 13 mg/dL (ref 8–27)
Bilirubin Total: 0.6 mg/dL (ref 0.0–1.2)
CO2: 24 mmol/L (ref 20–29)
Calcium: 9.6 mg/dL (ref 8.6–10.2)
Chloride: 99 mmol/L (ref 96–106)
Creatinine, Ser: 1.12 mg/dL (ref 0.76–1.27)
GFR calc Af Amer: 78 mL/min/{1.73_m2} (ref >59)
GFR calc non Af Amer: 68 mL/min/{1.73_m2} (ref >59)
Globulin, Total: 3.3 g/dL (ref 1.5–4.5)
Glucose: 102 mg/dL — ABNORMAL HIGH (ref 65–99)
Potassium: 4.5 mmol/L (ref 3.5–5.2)
Sodium: 138 mmol/L (ref 134–144)
Total Protein: 7.6 g/dL (ref 6.0–8.5)

## 2017-01-17 LAB — LIPID PANEL
Chol/HDL Ratio: 2.5 ratio (ref 0.0–5.0)
Cholesterol, Total: 140 mg/dL (ref 100–199)
HDL: 55 mg/dL (ref >39)
LDL Calculated: 75 mg/dL (ref 0–99)
Triglycerides: 52 mg/dL (ref 0–149)
VLDL Cholesterol Cal: 10 mg/dL (ref 5–40)

## 2017-01-20 ENCOUNTER — Encounter: Payer: Self-pay | Admitting: *Deleted

## 2017-02-11 ENCOUNTER — Encounter: Payer: Self-pay | Admitting: Family Medicine

## 2017-02-11 ENCOUNTER — Ambulatory Visit: Payer: PPO | Admitting: Family Medicine

## 2017-02-11 ENCOUNTER — Other Ambulatory Visit: Payer: Self-pay

## 2017-02-11 VITALS — BP 138/82 | HR 102 | Temp 98.5°F | Resp 17 | Ht 66.5 in | Wt 154.6 lb

## 2017-02-11 DIAGNOSIS — Z1211 Encounter for screening for malignant neoplasm of colon: Secondary | ICD-10-CM | POA: Diagnosis not present

## 2017-02-11 DIAGNOSIS — I10 Essential (primary) hypertension: Secondary | ICD-10-CM

## 2017-02-11 DIAGNOSIS — Z125 Encounter for screening for malignant neoplasm of prostate: Secondary | ICD-10-CM

## 2017-02-11 DIAGNOSIS — Z0000 Encounter for general adult medical examination without abnormal findings: Secondary | ICD-10-CM | POA: Diagnosis not present

## 2017-02-11 NOTE — Progress Notes (Signed)
Subjective:  By signing my name below, I, Essence Howell, attest that this documentation has been prepared under the direction and in the presence of Wendie Agreste, MD Electronically Signed: Ladene Artist, ED Scribe 02/11/2017 at 10:43 AM.   Patient ID: Jeffrey Serrano, male    DOB: 05-Apr-1950, 66 y.o.   MRN: [id number]  Chief Complaint  Patient presents with  . Annual Exam    complete physical exam   HPI  Jeffrey Serrano is a 66 y.o. male who presents to Primary Care at [facility] for an annual exam. H/o seizure disorder and HTN. Last OV 11/1.  HTN Controlled at last visit although borderline elevated. Recommended home monitoring but continued same dose HCTZ. Pt does not check his BP outside of the office.  Seizure Disorder Followed by neurologist Dr. [name].   Peripheral Neuropathy Episodic Gabapentin use at night.   CA Screening Colonoscopy: pt has never had a colonoscopy. Agrees to do Cologuard.  Prostate CA Screening: agrees to DRE and PSA at this visit  Lab Results  Component Value Date   PSA 0.63 07/21/2009   PSA 0.20 11/25/2007   Immunizations Immunization History  Administered Date(s) Administered  . Pneumococcal Polysaccharide-23 08/07/2016  . Td 08/31/2004  Plan for Prevnar next year Shingrix recommended but pt refused  Fall Screening  Denies fall within the past year.  Depression Screening Depression screen [facility] 2/9 02/11/2017 01/16/2017 08/07/2016 08/07/2016 07/01/2016  Decreased Interest 0 0 0 0 0  Down, Depressed, Hopeless 0 0 0 0 0  PHQ - 2 Score 0 0 0 0 0   Functional Status Survey: Is the patient deaf or have difficulty hearing?: No Does the patient have difficulty seeing, even when wearing glasses/contacts?: No Does the patient have difficulty concentrating, remembering, or making decisions?: No Does the patient have difficulty walking or climbing stairs?: No Does the patient have difficulty dressing or bathing?: No Does the patient have difficulty  doing errands alone such as visiting a doctor's office or shopping?: No   Visual Acuity Screening   Right eye Left eye Both eyes  Without correction: 20/20 20/20 20/20  With correction:      Vision: has not seen within the past year Dentist: has not seen within the past year Activity/Exercise: pt cleans buildings for work 5 days/week  Mental Status Screening No flowsheet data found.  Advanced Directives    Patient Active Problem List   Diagnosis Date Noted  . Chronic hepatitis C without hepatic coma ([place]) 08/07/2016  . Seizure disorder ([place]) 05/22/2016  . DEPRESSION 09/10/2006  . Essential hypertension 09/10/2006  . PERIPHERAL VASCULAR DISEASE 09/10/2006  . Seizures ([place]) 09/10/2006   Past Medical History:  Diagnosis Date  . Hypertension   . PVD (peripheral vascular disease) ([place])   . Seizures ([place])    No past surgical history on file. No Known Allergies Prior to Admission medications   Medication Sig Start Date End Date Taking? Authorizing Provider  Cholecalciferol (VITAMIN D3) 1000 units CAPS Take by mouth.   Yes [provider]  gabapentin (NEURONTIN) 100 MG capsule Take 1 capsule (100 mg total) by mouth daily as needed. 01/16/17  Yes Wendie Agreste, MD  ibuprofen (ADVIL,MOTRIN) 200 MG tablet Take 200 mg by mouth every 6 (six) hours as needed.   Yes [provider]  Lacosamide (VIMPAT) 100 MG TABS Take 1 tablet (100 mg total) by mouth 2 (two) times daily. 08/13/16  Yes [name], MD  lisinopril-hydrochlorothiazide (PRINZIDE,ZESTORETIC) 10-12.5 MG tablet  Take 1 tablet by mouth daily. 01/16/17  Yes Wendie Agreste, MD   Social History   Socioeconomic History  . Marital status: Legally Separated    Spouse name: Not on file  . Number of children: Not on file  . Years of education: Not on file  . Highest education level: Not on file  Social Needs  . Financial resource strain: Not on file  . Food insecurity - worry: Not on file  . Food  insecurity - inability: Not on file  . Transportation needs - medical: Not on file  . Transportation needs - non-medical: Not on file  Occupational History  . Not on file  Tobacco Use  . Smoking status: Never Smoker  . Smokeless tobacco: Never Used  Substance and Sexual Activity  . Alcohol use: No  . Drug use: No  . Sexual activity: Not on file  Other Topics Concern  . Not on file  Social History Narrative  . Not on file   Review of Systems  All other systems reviewed and are negative. 13 point ROS negative    Objective:   Physical Exam  Constitutional: He is oriented to person, place, and time. He appears well-developed and well-nourished.  HENT:  Head: Normocephalic and atraumatic.  Right Ear: External ear normal.  Left Ear: External ear normal.  Mouth/Throat: Oropharynx is clear and moist.  Mouth: Some fillings. Some missing teeth but no apparent decay otherwise.  Eyes: Conjunctivae and EOM are normal. Pupils are equal, round, and reactive to light.  Neck: Normal range of motion. Neck supple. No thyromegaly present.  Cardiovascular: Normal rate, regular rhythm, normal heart sounds and intact distal pulses.  Pulmonary/Chest: Effort normal and breath sounds normal. No respiratory distress. He has no wheezes.  Abdominal: Soft. He exhibits no distension. There is no tenderness. Hernia confirmed negative in the right inguinal area and confirmed negative in the left inguinal area.  Genitourinary: Prostate normal.  Musculoskeletal: Normal range of motion. He exhibits no edema or tenderness.  Lymphadenopathy:    He has no cervical adenopathy.  Neurological: He is alert and oriented to person, place, and time. He has normal reflexes.  Skin: Skin is warm and dry.  Psychiatric: He has a normal mood and affect. His behavior is normal.  Vitals reviewed.  Vitals:   02/11/17 1005  BP: 138/82  Pulse: (!) 102  Resp: 17  Temp: 98.5 F (36.9 C)  TempSrc: Oral  SpO2: 98%  Weight:  154 lb 9.6 oz (70.1 kg)  Height: 5' 6.5" (1.689 m)      Assessment & Plan:   Jeffrey Serrano is a 66 y.o. male Medicare annual wellness visit, subsequent  -  - anticipatory guidance as below in AVS, screening labs if needed. Health maintenance items as above in HPI discussed/recommended as applicable.   - no concerning responses on depression, fall, or functional status screening. Any positive responses noted as above. Advanced directives discussed as in CHL. Declined shingles vaccine.   Screen for colon cancer - Plan: Cologuard  - declined colonoscopy. Agreed to Cologuard.   Essential hypertension  - borderline, but controlled. No change in regimen for now.   Screening for prostate cancer - Plan: PSA  - We discussed pros and cons of prostate cancer screening, and after this discussion, he chose to have screening done. PSA obtained, and no concerning findings on DRE.    No orders of the defined types were placed in this encounter.  Patient Instructions  Follow up with eye care provider once per year - please schedule appointment.   Let me know if you change your mind about shingles vaccine.   I will check prostate test and order the colon cancer screen.   Thanks for coming in today.     Preventive Care 75 Years and Older, Male Preventive care refers to lifestyle choices and visits with your health care provider that can promote health and wellness. What does preventive care include?  A yearly physical exam. This is also called an annual well check.  Dental exams once or twice a year.  Routine eye exams. Ask your health care provider how often you should have your eyes checked.  Personal lifestyle choices, including: ? Daily care of your teeth and gums. ? Regular physical activity. ? Eating a healthy diet. ? Avoiding tobacco and drug use. ? Limiting alcohol use. ? Practicing safe sex. ? Taking low doses of aspirin every day. ? Taking vitamin and mineral supplements  as recommended by your health care provider. What happens during an annual well check? The services and screenings done by your health care provider during your annual well check will depend on your age, overall health, lifestyle risk factors, and family history of disease. Counseling Your health care provider may ask you questions about your:  Alcohol use.  Tobacco use.  Drug use.  Emotional well-being.  Home and relationship well-being.  Sexual activity.  Eating habits.  History of falls.  Memory and ability to understand (cognition).  Work and work Statistician.  Screening You may have the following tests or measurements:  Height, weight, and BMI.  Blood pressure.  Lipid and cholesterol levels. These may be checked every 5 years, or more frequently if you are over 21 years old.  Skin check.  Lung cancer screening. You may have this screening every year starting at age 68 if you have a 30-pack-year history of smoking and currently smoke or have quit within the past 15 years.  Fecal occult blood test (FOBT) of the stool. You may have this test every year starting at age 14.  Flexible sigmoidoscopy or colonoscopy. You may have a sigmoidoscopy every 5 years or a colonoscopy every 10 years starting at age 96.  Prostate cancer screening. Recommendations will vary depending on your family history and other risks.  Hepatitis C blood test.  Hepatitis B blood test.  Sexually transmitted disease (STD) testing.  Diabetes screening. This is done by checking your blood sugar (glucose) after you have not eaten for a while (fasting). You may have this done every 1-3 years.  Abdominal aortic aneurysm (AAA) screening. You may need this if you are a current or former smoker.  Osteoporosis. You may be screened starting at age 44 if you are at high risk.  Talk with your health care provider about your test results, treatment options, and if necessary, the need for more  tests. Vaccines Your health care provider may recommend certain vaccines, such as:  Influenza vaccine. This is recommended every year.  Tetanus, diphtheria, and acellular pertussis (Tdap, Td) vaccine. You may need a Td booster every 10 years.  Varicella vaccine. You may need this if you have not been vaccinated.  Zoster vaccine. You may need this after age 76.  Measles, mumps, and rubella (MMR) vaccine. You may need at least one dose of MMR if you were born in 1957 or later. You may also need a second dose.  Pneumococcal 13-valent conjugate (PCV13) vaccine. One dose is  recommended after age 48.  Pneumococcal polysaccharide (PPSV23) vaccine. One dose is recommended after age 41.  Meningococcal vaccine. You may need this if you have certain conditions.  Hepatitis A vaccine. You may need this if you have certain conditions or if you travel or work in places where you may be exposed to hepatitis A.  Hepatitis B vaccine. You may need this if you have certain conditions or if you travel or work in places where you may be exposed to hepatitis B.  Haemophilus influenzae type b (Hib) vaccine. You may need this if you have certain risk factors.  Talk to your health care provider about which screenings and vaccines you need and how often you need them. This information is not intended to replace advice given to you by your health care provider. Make sure you discuss any questions you have with your health care provider. Document Released: 03/31/2015 Document Revised: 11/22/2015 Document Reviewed: 01/03/2015 Elsevier Interactive Patient Education  2017 [employer].   IF you received an x-ray today, you will receive an invoice from [facility]. Please contact [facility] at [phone] with questions or concerns regarding your invoice.   IF you received labwork today, you will receive an invoice from [facility]. Please contact [facility] at [phone] with questions or  concerns regarding your invoice.   Our billing staff will not be able to assist you with questions regarding bills from these companies.  You will be contacted with the lab results as soon as they are available. The fastest way to get your results is to activate your My Chart account. Instructions are located on the last page of this paperwork. If you have not heard from [place] regarding the results in 2 weeks, please contact this office.      I personally performed the services described in this documentation, which was scribed in my presence. The recorded information has been reviewed and considered for accuracy and completeness, addended by me as needed, and agree with information above.  Signed,   [name], MD [facility] at [facility].  02/13/17 3:41 PM

## 2017-02-11 NOTE — Patient Instructions (Addendum)
Follow up with eye care provider once per year - please schedule appointment.   Let me know if you change your mind about shingles vaccine.   I will check prostate test and order the colon cancer screen.   Thanks for coming in today.     Preventive Care 67 Years and Older, Male Preventive care refers to lifestyle choices and visits with your health care provider that can promote health and wellness. What does preventive care include?  A yearly physical exam. This is also called an annual well check.  Dental exams once or twice a year.  Routine eye exams. Ask your health care provider how often you should have your eyes checked.  Personal lifestyle choices, including: ? Daily care of your teeth and gums. ? Regular physical activity. ? Eating a healthy diet. ? Avoiding tobacco and drug use. ? Limiting alcohol use. ? Practicing safe sex. ? Taking low doses of aspirin every day. ? Taking vitamin and mineral supplements as recommended by your health care provider. What happens during an annual well check? The services and screenings done by your health care provider during your annual well check will depend on your age, overall health, lifestyle risk factors, and family history of disease. Counseling Your health care provider may ask you questions about your:  Alcohol use.  Tobacco use.  Drug use.  Emotional well-being.  Home and relationship well-being.  Sexual activity.  Eating habits.  History of falls.  Memory and ability to understand (cognition).  Work and work Statistician.  Screening You may have the following tests or measurements:  Height, weight, and BMI.  Blood pressure.  Lipid and cholesterol levels. These may be checked every 5 years, or more frequently if you are over 11 years old.  Skin check.  Lung cancer screening. You may have this screening every year starting at age 16 if you have a 30-pack-year history of smoking and currently smoke or  have quit within the past 15 years.  Fecal occult blood test (FOBT) of the stool. You may have this test every year starting at age 67.  Flexible sigmoidoscopy or colonoscopy. You may have a sigmoidoscopy every 5 years or a colonoscopy every 10 years starting at age 67.  Prostate cancer screening. Recommendations will vary depending on your family history and other risks.  Hepatitis C blood test.  Hepatitis B blood test.  Sexually transmitted disease (STD) testing.  Diabetes screening. This is done by checking your blood sugar (glucose) after you have not eaten for a while (fasting). You may have this done every 1-3 years.  Abdominal aortic aneurysm (AAA) screening. You may need this if you are a current or former smoker.  Osteoporosis. You may be screened starting at age 67 if you are at high risk.  Talk with your health care provider about your test results, treatment options, and if necessary, the need for more tests. Vaccines Your health care provider may recommend certain vaccines, such as:  Influenza vaccine. This is recommended every year.  Tetanus, diphtheria, and acellular pertussis (Tdap, Td) vaccine. You may need a Td booster every 10 years.  Varicella vaccine. You may need this if you have not been vaccinated.  Zoster vaccine. You may need this after age 67.  Measles, mumps, and rubella (MMR) vaccine. You may need at least one dose of MMR if you were born in 1957 or later. You may also need a second dose.  Pneumococcal 13-valent conjugate (PCV13) vaccine. One dose is recommended  after age 67.  Pneumococcal polysaccharide (PPSV23) vaccine. One dose is recommended after age 67.  Meningococcal vaccine. You may need this if you have certain conditions.  Hepatitis A vaccine. You may need this if you have certain conditions or if you travel or work in places where you may be exposed to hepatitis A.  Hepatitis B vaccine. You may need this if you have certain conditions  or if you travel or work in places where you may be exposed to hepatitis B.  Haemophilus influenzae type b (Hib) vaccine. You may need this if you have certain risk factors.  Talk to your health care provider about which screenings and vaccines you need and how often you need them. This information is not intended to replace advice given to you by your health care provider. Make sure you discuss any questions you have with your health care provider. Document Released: 03/31/2015 Document Revised: 11/22/2015 Document Reviewed: 01/03/2015 Elsevier Interactive Patient Education  2017 [employer].   IF you received an x-ray today, you will receive an invoice from [facility]. Please contact [facility] at [phone] with questions or concerns regarding your invoice.   IF you received labwork today, you will receive an invoice from [place]. Please contact [facility] at [phone] with questions or concerns regarding your invoice.   Our billing staff will not be able to assist you with questions regarding bills from these companies.  You will be contacted with the lab results as soon as they are available. The fastest way to get your results is to activate your My Chart account. Instructions are located on the last page of this paperwork. If you have not heard from [place] regarding the results in 2 weeks, please contact this office.

## 2017-02-12 LAB — PSA: Prostate Specific Ag, Serum: 0.6 ng/mL (ref 0.0–4.0)

## 2017-02-19 ENCOUNTER — Encounter: Payer: Self-pay | Admitting: Radiology

## 2017-03-13 ENCOUNTER — Other Ambulatory Visit: Payer: Self-pay | Admitting: *Deleted

## 2017-03-13 ENCOUNTER — Telehealth: Payer: Self-pay | Admitting: Diagnostic Neuroimaging

## 2017-03-13 DIAGNOSIS — R569 Unspecified convulsions: Secondary | ICD-10-CM

## 2017-03-13 MED ORDER — LACOSAMIDE 100 MG PO TABS: 1.0000 | ORAL_TABLET | Freq: Two times a day (BID) | ORAL | 3 refills | Status: DC

## 2017-03-13 NOTE — Telephone Encounter (Signed)
Vimpat Rx already faxed to pharmacy this morning. Patient can refill.

## 2017-03-13 NOTE — Telephone Encounter (Addendum)
Vimpat refilled by Dr Jaynee Eagles as work in dr for Dr [name] who is out of the office x 2 weeks. Successfully faxed to [facility] [place]

## 2017-03-13 NOTE — Telephone Encounter (Signed)
Called and spoke to patient's brother relayed patient let his assistance run out  2017. I relayed to Brother I would be glad to help again Patient was noified . He will bring patient for paper work and and proof of income  03/24/2017 .   Tyler Aas , Will you please call in refill to pharmacy for VIMPAT. Approval will take 7-10 business days. Patient's brother will buy until we get a approval. Thanks Hinton Dyer.

## 2017-03-13 NOTE — Telephone Encounter (Signed)
Pt brother called wanting to know if they could switch pts medicine the one he is on is too [employer] at  [phone] to discuss

## 2017-03-13 NOTE — Telephone Encounter (Signed)
Called patient who stated that he has not tried to refill medication recently, so he is unsure of the cost now. It was $285 for 3 month's last time he refilled.  This RN advised that Hinton Dyer in this office can assist him with possibly getting patient assistance through the [place].  He stated he will refill again (runs out on Sunday) and  will wait for a call back from [place]. He requested she call his brother on Kelly Splinter [phone]. He understands that he needs to continue taking the medication. He is aware Dr [name] is out of the office as well.  Patient verbalized understanding, appreciation of call. Call routed to [facility].

## 2017-03-19 DIAGNOSIS — Z1211 Encounter for screening for malignant neoplasm of colon: Secondary | ICD-10-CM | POA: Diagnosis not present

## 2017-03-19 DIAGNOSIS — Z1212 Encounter for screening for malignant neoplasm of rectum: Secondary | ICD-10-CM | POA: Diagnosis not present

## 2017-03-24 ENCOUNTER — Other Ambulatory Visit: Payer: Self-pay | Admitting: Family Medicine

## 2017-03-25 ENCOUNTER — Other Ambulatory Visit: Payer: Self-pay

## 2017-03-25 DIAGNOSIS — R569 Unspecified convulsions: Secondary | ICD-10-CM

## 2017-03-25 MED ORDER — LACOSAMIDE 100 MG PO TABS: 1.0000 | ORAL_TABLET | Freq: Two times a day (BID) | ORAL | 3 refills | Status: DC

## 2017-03-25 NOTE — Telephone Encounter (Signed)
Thank you all for your help.  [name], MD PhD Stroke Neurology 03/25/2017 5:43 PM

## 2017-03-25 NOTE — Telephone Encounter (Signed)
Patient reapplying for patient assistance Vimpat . Patient did not renew in 2017.  I will Need a hard copy RX printed and Dr. [name] to Sign Paper work . Thanks Hinton Dyer   [facility] Patient assistance program telephone [phone] .

## 2017-03-25 NOTE — Telephone Encounter (Signed)
Revised. 

## 2017-03-26 NOTE — Telephone Encounter (Signed)
Patient assistance has been submitted 7-10 turn around time for approval.

## 2017-03-26 NOTE — Telephone Encounter (Signed)
Printed rx for vimpat, patient assistance paperwork sign by Dr. [name] given to Rance Muir.

## 2017-03-29 LAB — COLOGUARD: Cologuard: NEGATIVE

## 2017-03-31 NOTE — Progress Notes (Signed)
Spoke with pt regarding result. All is well.

## 2017-04-09 NOTE — Progress Notes (Signed)
[facility]  PATIENT: Jeffrey Serrano DOB: 25-Sep-1950   REASON FOR VISIT: Follow-up for seizures HISTORY FROM: Patient and brother    HISTORY OF PRESENT ILLNESS:Jeffrey Serrano is a 67 y.o. male with PMH of Seizure disorder who presents as a new patient for Seizure. Patient complains by his brother, however both of them are poor historian, and not able to provide detailed information. However as per his brother, patient had history of a seizure disorder on Dilantin. About one month ago, he was getting out of the elevator, suddenly fell. Brother was able to hold him and lowered him to the ground. [name] witnessed all 4 extremities shivering and stiffness, lasting about 2-3 minutes. Denies any tongue biting, urinary or bowel incontinence. After that episode, as per brother he woke up, without a confusion or any difficulty, and went on to work that day.  As per note, he had ED visit on 05/10/2013 due to seizure. 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   He and his brother cannot tell when he started to have seizure, when Dilantin was started and how often of the seizure. Patient only takes Dilantin 100 mg every day. However, the instruction written is for Dilantin 100 mg twice a day. Brother does note that patient had significant memory issues, not able to remember things, this is at least for about one year. However he cannot provide a further information and stated that patient wife may know it.  He denies any cigarette smoking, alcohol or illicit drugs. He is still working every day, however, he is not driving and he is taking bus to work.  12/04/15 follow up - pt has been doing well. No seizure episodes. Had EEG which was normal EEG. Has not have MRI done yet. On vimpat bid. Still on dilantin bid and asked pt to discontinue.  Otherwise, no complains. He is not driving.  Interval history:10/08/16 Dr. [name] During the interval time, pt has been doing well. No more seizures. Had MRI brain with and without contrast unremarkable. Continued on vimpat 100mg  bid. He has to pay $285 for 3 months supply. He is so far able to afford. His BP today is high at 152/97. He is on lisinopril 10mg  and HCTZ 12.5mg . Encourage pt check BP at home.  UPDATE 1/24/2019[name], 67 year old male returns for follow-up with his brother with history of seizure disorder.  He has not had any seizures in several years.  He is currently on Vimpat and having difficulty affording this.  He is applied for patient assistance but does not know the outcome.  He does not drive.  MRI of the brain in the past is been unremarkable.  EEG was normal.  He is retired.  He returns for reevaluation He has no new neurologic complaints  REVIEW OF SYSTEMS: Full 14 system review of systems performed and notable only for those listed, all others are neg:  Constitutional: neg  Cardiovascular: neg Ear/Nose/Throat: neg  Skin: neg Eyes: neg Respiratory: neg Gastroitestinal: neg  Hematology/Lymphatic: neg  Endocrine: neg Musculoskeletal:neg Allergy/Immunology: neg Neurological: History seizure disorder Psychiatric: neg Sleep : neg   ALLERGIES: No Known Allergies  HOME MEDICATIONS: Outpatient Medications Prior to Visit  Medication Sig Dispense Refill  . Cholecalciferol (VITAMIN D3) 1000 units CAPS Take by mouth.    . gabapentin (NEURONTIN)  100 MG capsule Take 1 capsule (100 mg total) by mouth daily as needed. 90 capsule 1  . ibuprofen (ADVIL,MOTRIN) 200 MG tablet Take 200 mg by mouth every 6 (six) hours as needed.    . Lacosamide (VIMPAT) 100 MG TABS Take 1 tablet (100 mg total) by mouth 2 (two) times daily. 180 tablet 3  . lisinopril-hydrochlorothiazide (PRINZIDE,ZESTORETIC) 10-12.5 MG tablet Take 1 tablet by mouth daily. 90 tablet 1   No  facility-administered medications prior to visit.     PAST MEDICAL HISTORY: Past Medical History:  Diagnosis Date  . Hypertension   . PVD (peripheral vascular disease) ([place])   . Seizures ([place])     PAST SURGICAL HISTORY: History reviewed. No pertinent surgical history.  FAMILY HISTORY: Family History  Problem Relation Age of Onset  . Seizures Father     SOCIAL HISTORY: Social History   Socioeconomic History  . Marital status: Legally Separated    Spouse name: Not on file  . Number of children: Not on file  . Years of education: Not on file  . Highest education level: Not on file  Social Needs  . Financial resource strain: Not on file  . Food insecurity - worry: Not on file  . Food insecurity - inability: Not on file  . Transportation needs - medical: Not on file  . Transportation needs - non-medical: Not on file  Occupational History  . Not on file  Tobacco Use  . Smoking status: Never Smoker  . Smokeless tobacco: Never Used  Substance and Sexual Activity  . Alcohol use: No  . Drug use: No  . Sexual activity: Not on file  Other Topics Concern  . Not on file  Social History Narrative  . Not on file     PHYSICAL EXAM  Vitals:   04/10/17 0954  BP: 119/81  Pulse: 77  Weight: 157 lb 9.6 oz (71.5 kg)   Body mass index is 25.06 kg/m.  Generalized: Well developed, in no acute distress  Head: normocephalic and atraumatic,. Oropharynx benign  Neck: Supple,  Musculoskeletal: No deformity   Neurological examination   Mentation: Alert oriented to time, place, history taking. Attention span and concentration appropriate. Recent and remote memory intact.  Follows all commands speech and language fluent.   Cranial nerve II-XII: Pupils were equal round reactive to light extraocular movements were full, visual field were full on confrontational test. Facial sensation and strength were normal. hearing was intact to finger rubbing bilaterally. Uvula tongue midline.  head turning and shoulder shrug were normal and symmetric.Tongue protrusion into cheek strength was normal. Motor: normal bulk and tone, full strength in the BUE, BLE, fine finger movements normal, no pronator drift. No focal weakness Sensory: normal and symmetric to light touch,  Coordination: finger-nose-finger, heel-to-shin bilaterally, no dysmetria, no tremor Reflexes: Brachioradialis 2/2, biceps 2/2, triceps 2/2, patellar 2/2, Achilles 2/2, plantar responses were flexor bilaterally. Gait and Station: Rising up from seated position without assistance, normal stance,  moderate stride, good arm swing, smooth turning, able to perform tiptoe, and heel walking without difficulty. Tandem gait is steady  DIAGNOSTIC DATA (LABS, IMAGING, TESTING) - I reviewed patient records, labs, notes, testing and imaging myself where available.  Lab Results  Component Value Date   WBC 12.9 (H) 08/07/2016   HGB 16.2 08/07/2016   HCT 47.4 08/07/2016   MCV 89.9 08/07/2016   PLT 215 08/07/2016      Component Value Date/Time   NA 138 01/16/2017 1146   K 4.5  01/16/2017 1146   CL 99 01/16/2017 1146   CO2 24 01/16/2017 1146   GLUCOSE 102 (H) 01/16/2017 1146   GLUCOSE 95 08/07/2016 1403   BUN 13 01/16/2017 1146   CREATININE 1.12 01/16/2017 1146   CREATININE 1.61 (H) 08/07/2016 1403   CALCIUM 9.6 01/16/2017 1146   PROT 7.6 01/16/2017 1146   ALBUMIN 4.3 01/16/2017 1146   AST 29 01/16/2017 1146   ALT 22 01/16/2017 1146   ALT 42 08/07/2016 1403   ALKPHOS 63 01/16/2017 1146   BILITOT 0.6 01/16/2017 1146   GFRNONAA 68 01/16/2017 1146   GFRNONAA 44 (L) 08/07/2016 1403   GFRAA 78 01/16/2017 1146   GFRAA 50 (L) 08/07/2016 1403   Lab Results  Component Value Date   CHOL 140 01/16/2017   HDL 55 01/16/2017   LDLCALC 75 01/16/2017   TRIG 52 01/16/2017   CHOLHDL 2.5 01/16/2017   No results found for: HGBA1C Lab Results  Component Value Date   VITAMINB12 378 07/21/2009   Lab Results  Component Value  Date   TSH 1.129 07/21/2009      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 Patient and his brother are both poor historian, not sure about when seizure started, when Dilantin was started, and how often of seizure. Apparently, he had seizure in 04/2013 and in 08/2015, during both seizures, he lost consciousness and fell.  He has not had further seizure events since that time  Changed to vimpat 100mg  bid dosing. Had EEG normal and MRI unremarkable.  He is not driving as he does not have a car.The patient is a current patient of Dr. [name]  who is out of the office today . This note is sent to the work in doctor.     Plan: Continue vimpat for seizure control.  Do not run out of medication Patient is trying to get medication through patient assistance , he has not heard back from them  He can call [phone] pt is not driving  Reviewed recent CBC CMP seizure precautions explained  continue your other medications follow up with PCP  Call for seizure activity  Follow-up yearly and as needed   Dennie Bible, [facility], [facility], [facility] [address], [place], [place] [address] [phone]

## 2017-04-10 ENCOUNTER — Encounter: Payer: Self-pay | Admitting: Nurse Practitioner

## 2017-04-10 ENCOUNTER — Ambulatory Visit (INDEPENDENT_AMBULATORY_CARE_PROVIDER_SITE_OTHER): Payer: PPO | Admitting: Nurse Practitioner

## 2017-04-10 VITALS — BP 119/81 | HR 77 | Wt 157.6 lb

## 2017-04-10 DIAGNOSIS — G40909 Epilepsy, unspecified, not intractable, without status epilepticus: Secondary | ICD-10-CM

## 2017-04-10 NOTE — Telephone Encounter (Signed)
Cecille Rubin NP came into office today to check status of patient assistance, the patient only has a week of medication left. We gave the patient the phone number for the patient assistance. I called over to the patient assistance program and got an update, the paper work was missing a date on page two. I resubmitted the paper work with the date added.

## 2017-04-10 NOTE — Progress Notes (Signed)
I agree with the assessment and plan as directed by NP .The patient is followed by dr Alycia Rossetti, MD

## 2017-04-10 NOTE — Patient Instructions (Signed)
Continue vimpat for seizure control.  Patient is getting medication through patient assistance call [phone] pt is not driving  Seizure precautions continue your other medications follow up with PCP  Call for seizure activity  Follow-up yearly and as needed

## 2017-04-11 NOTE — Telephone Encounter (Signed)
Thanks Danielle!

## 2017-04-22 ENCOUNTER — Telehealth: Payer: Self-pay | Admitting: Nurse Practitioner

## 2017-04-22 NOTE — Telephone Encounter (Signed)
Patient was approved for VIMPAT patient assistance program [employer] program [phone].

## 2017-07-10 ENCOUNTER — Other Ambulatory Visit: Payer: Self-pay | Admitting: Family Medicine

## 2017-07-10 DIAGNOSIS — I10 Essential (primary) hypertension: Secondary | ICD-10-CM

## 2017-08-12 ENCOUNTER — Ambulatory Visit (INDEPENDENT_AMBULATORY_CARE_PROVIDER_SITE_OTHER): Payer: PPO | Admitting: Family Medicine

## 2017-08-12 ENCOUNTER — Encounter: Payer: Self-pay | Admitting: Family Medicine

## 2017-08-12 ENCOUNTER — Other Ambulatory Visit: Payer: Self-pay

## 2017-08-12 VITALS — BP 140/80 | HR 66 | Temp 97.6°F | Ht 67.0 in | Wt 157.4 lb

## 2017-08-12 DIAGNOSIS — I10 Essential (primary) hypertension: Secondary | ICD-10-CM

## 2017-08-12 DIAGNOSIS — Z23 Encounter for immunization: Secondary | ICD-10-CM

## 2017-08-12 MED ORDER — LISINOPRIL-HYDROCHLOROTHIAZIDE 10-12.5 MG PO TABS: 1.0000 | ORAL_TABLET | Freq: Every day | ORAL | 1 refills | Status: DC

## 2017-08-12 NOTE — Progress Notes (Signed)
Subjective:  By signing my name below, I, Jeffrey Serrano, attest that this documentation has been prepared under the direction and in the presence of Jeffrey Ray, MD. Electronically Signed: Moises Serrano, Jeffrey Serrano. 08/12/2017 , 9:19 AM .  Patient was seen in Room 8 .   Patient ID: Jeffrey Serrano, male    DOB: 1950/09/03, 67 y.o.   MRN: [id number] Chief Complaint  Patient presents with  . Chronic Condition    6 m f/u    HPI Jeffrey Serrano is a 67 y.o. male Here for follow up.   HTN BP Readings from Last 3 Encounters:  08/12/17 140/80  04/10/17 119/81  02/11/17 138/82   Lab Results  Component Value Date   CREATININE 1.12 01/16/2017   He takes Lisinopril-HCT 10-12.5mg  qd. He takes medications daily; denies taking BP medication this morning. He rarely checks his BP outside of the office. He denies any dark or tarry stool.   History of seizure disorder He's been treated by Jeffrey Serrano and [facility] at [facility]. He takes Vimpat for seizure control. Follow up yearly and as needed. He denies any seizure recently.   Health maintenance Negative Cologuard in Jan.  Plan for prevnar today.   Patient Active Problem List   Diagnosis Date Noted  . Chronic hepatitis C without hepatic coma ([place]) 08/07/2016  . Seizure disorder ([place]) 05/22/2016  . DEPRESSION 09/10/2006  . Essential hypertension 09/10/2006  . PERIPHERAL VASCULAR DISEASE 09/10/2006  . Seizures ([place]) 09/10/2006   Past Medical History:  Diagnosis Date  . Hypertension   . PVD (peripheral vascular disease) ([place])   . Seizures ([place])    No past surgical history on file. No Known Allergies Prior to Admission medications   Medication Sig Start Date End Date Taking? Authorizing Provider  Cholecalciferol (VITAMIN D3) 1000 units CAPS Take by mouth.    [provider]  gabapentin (NEURONTIN) 100 MG capsule Take 1 capsule (100 mg total) by mouth daily as needed. 01/16/17   Jeffrey Agreste, MD  ibuprofen  (ADVIL,MOTRIN) 200 MG tablet Take 200 mg by mouth every 6 (six) hours as needed.    [provider]  Lacosamide (VIMPAT) 100 MG TABS Take 1 tablet (100 mg total) by mouth 2 (two) times daily. 03/25/17   Jeffrey Hawking, MD  lisinopril-hydrochlorothiazide (PRINZIDE,ZESTORETIC) 10-12.5 MG tablet TAKE 1 TABLET BY MOUTH ONCE DAILY 07/11/17   Jeffrey Agreste, MD   Social History   Socioeconomic History  . Marital status: Legally Separated    Spouse name: Not on file  . Number of children: Not on file  . Years of education: Not on file  . Highest education level: Not on file  Occupational History  . Not on file  Social Needs  . Financial resource strain: Not on file  . Food insecurity:    Worry: Not on file    Inability: Not on file  . Transportation needs:    Medical: Not on file    Non-medical: Not on file  Tobacco Use  . Smoking status: Never Smoker  . Smokeless tobacco: Never Used  Substance and Sexual Activity  . Alcohol use: No  . Drug use: No  . Sexual activity: Not on file  Lifestyle  . Physical activity:    Days per week: Not on file    Minutes per session: Not on file  . Stress: Not on file  Relationships  . Social connections:    Talks on phone: Not on file  Gets together: Not on file    Attends religious service: Not on file    Active member of club or organization: Not on file    Attends meetings of clubs or organizations: Not on file    Relationship status: Not on file  . Intimate partner violence:    Fear of current or ex partner: Not on file    Emotionally abused: Not on file    Physically abused: Not on file    Forced sexual activity: Not on file  Other Topics Concern  . Not on file  Social History Narrative  . Not on file   Review of Systems  Constitutional: Negative for fatigue and unexpected weight change.  Eyes: Negative for visual disturbance.  Respiratory: Negative for cough, chest tightness and shortness of breath.   Cardiovascular:  Negative for chest pain, palpitations and leg swelling.  Gastrointestinal: Negative for abdominal pain and Serrano in stool.  Neurological: Negative for dizziness, light-headedness and headaches.       Objective:   Physical Exam  Constitutional: He is oriented to person, place, and time. He appears well-developed and well-nourished.  HENT:  Head: Normocephalic and atraumatic.  Eyes: Pupils are equal, round, and reactive to light. EOM are normal.  Neck: No JVD present. Carotid bruit is not present.  Cardiovascular: Normal rate, regular rhythm and normal heart sounds.  No murmur heard. Pulmonary/Chest: Effort normal and breath sounds normal. He has no rales.  Musculoskeletal: He exhibits no edema.  Neurological: He is alert and oriented to person, place, and time.  Skin: Skin is warm and dry.  Psychiatric: He has a normal mood and affect.  Vitals reviewed.   Vitals:   08/12/17 0900  BP: 140/80  Pulse: 66  Temp: 97.6 F (36.4 C)  TempSrc: Oral  SpO2: 98%  Weight: 157 lb 6.4 oz (71.4 kg)  Height: 5\' 7"  (1.702 m)       Assessment & Plan:   Jeffrey Serrano is a 67 y.o. male Essential hypertension - Plan: Basic metabolic panel, lisinopril-hydrochlorothiazide (PRINZIDE,ZESTORETIC) 10-12.5 MG tablet  -Borderline, but reports stable readings at home.  Tolerating current medication without any side effects, check a BMP, continue same dose lisinopril HCTZ with 52-month follow-up.  Need for pneumococcal vaccine - Plan: Pneumococcal conjugate vaccine 13-valent IM  -Prevnar given  Meds ordered this encounter  Medications  . lisinopril-hydrochlorothiazide (PRINZIDE,ZESTORETIC) 10-12.5 MG tablet    Sig: Take 1 tablet by mouth daily.    Dispense:  90 tablet    Refill:  1    Needs office visit for additional refills.   Patient Instructions    Serrano pressure is borderline here today.  Monitor that outside of the office and if it remains over 140 on the top number or over 90 on the  bottom number let me know.  Otherwise continue same dose of medications for now.  Pneumonia vaccine was given today.  Recheck in 6 months.  Thank you for coming in today.   IF you received an x-Serrano today, you will receive an invoice from [facility]. Please contact [facility] at [phone] with questions or concerns regarding your invoice.   IF you received labwork today, you will receive an invoice from [facility]. Please contact [facility] at [phone] with questions or concerns regarding your invoice.   Our billing staff will not be able to assist you with questions regarding bills from these companies.  You will be contacted with the lab results as soon as they are available.  The fastest way to get your results is to activate your My Chart account. Instructions are located on the last page of this paperwork. If you have not heard from [place] regarding the results in 2 weeks, please contact this office.       I personally performed the services described in this documentation, which was scribed in my presence. The recorded information has been reviewed and considered for accuracy and completeness, addended by me as needed, and agree with information above.  Signed,   Jeffrey Ray, MD [facility] [place].  08/12/17 9:24 AM

## 2017-08-12 NOTE — Patient Instructions (Addendum)
  Blood pressure is borderline here today.  Monitor that outside of the office and if it remains over 140 on the top number or over 90 on the bottom number let me know.  Otherwise continue same dose of medications for now.  Pneumonia vaccine was given today.  Recheck in 6 months.  Thank you for coming in today.   IF you received an x-ray today, you will receive an invoice from [facility]. Please contact [facility] at [phone] with questions or concerns regarding your invoice.   IF you received labwork today, you will receive an invoice from [place]. Please contact [facility] at [phone] with questions or concerns regarding your invoice.   Our billing staff will not be able to assist you with questions regarding bills from these companies.  You will be contacted with the lab results as soon as they are available. The fastest way to get your results is to activate your My Chart account. Instructions are located on the last page of this paperwork. If you have not heard from [place] regarding the results in 2 weeks, please contact this office.

## 2017-08-13 ENCOUNTER — Other Ambulatory Visit: Payer: Self-pay | Admitting: Family Medicine

## 2017-08-13 DIAGNOSIS — E875 Hyperkalemia: Secondary | ICD-10-CM

## 2017-08-13 LAB — BASIC METABOLIC PANEL
BUN/Creatinine Ratio: 10 (ref 10–24)
BUN: 14 mg/dL (ref 8–27)
CO2: 13 mmol/L — CL (ref 20–29)
Calcium: 10.1 mg/dL (ref 8.6–10.2)
Chloride: 106 mmol/L (ref 96–106)
Creatinine, Ser: 1.39 mg/dL — ABNORMAL HIGH (ref 0.76–1.27)
GFR calc Af Amer: 60 mL/min/{1.73_m2} (ref >59)
GFR calc non Af Amer: 52 mL/min/{1.73_m2} — ABNORMAL LOW (ref >59)
Glucose: 107 mg/dL — ABNORMAL HIGH (ref 65–99)
Potassium: 5.9 mmol/L — ABNORMAL HIGH (ref 3.5–5.2)
Sodium: 141 mmol/L (ref 134–144)

## 2017-08-13 NOTE — Progress Notes (Signed)
See lab notes - lab only visit needed to repeat/verify results.

## 2017-08-16 ENCOUNTER — Other Ambulatory Visit: Payer: Self-pay | Admitting: Family Medicine

## 2017-08-16 DIAGNOSIS — I10 Essential (primary) hypertension: Secondary | ICD-10-CM

## 2017-08-28 ENCOUNTER — Telehealth: Payer: Self-pay | Admitting: Family Medicine

## 2017-08-28 ENCOUNTER — Telehealth: Payer: Self-pay | Admitting: *Deleted

## 2017-08-28 NOTE — Telephone Encounter (Signed)
Pt returned phone call from office regarding lab results  Results not in [facility]  Results cue  [place] [phone] read   Result note entered by Dr Carlota Raspberry on labs entered on 08/13/2017 read to patient   Patient advised to present ASAP to  [place] to have repeat labs drawn advised to fast  Patient states he will be there tomorrow at 0800

## 2017-08-28 NOTE — Telephone Encounter (Signed)
Pt's brother given message re: repeat lab draw.  Verbalizes understanding. Will bring pt in for lab (BMET) tomorrow AM.

## 2017-08-29 ENCOUNTER — Ambulatory Visit (INDEPENDENT_AMBULATORY_CARE_PROVIDER_SITE_OTHER): Payer: PPO | Admitting: Family Medicine

## 2017-08-29 DIAGNOSIS — E875 Hyperkalemia: Secondary | ICD-10-CM | POA: Diagnosis not present

## 2017-08-29 LAB — BASIC METABOLIC PANEL
BUN/Creatinine Ratio: 9 — ABNORMAL LOW (ref 10–24)
BUN: 10 mg/dL (ref 8–27)
CO2: 24 mmol/L (ref 20–29)
Calcium: 10 mg/dL (ref 8.6–10.2)
Chloride: 99 mmol/L (ref 96–106)
Creatinine, Ser: 1.13 mg/dL (ref 0.76–1.27)
GFR calc Af Amer: 77 mL/min/{1.73_m2} (ref >59)
GFR calc non Af Amer: 66 mL/min/{1.73_m2} (ref >59)
Glucose: 96 mg/dL (ref 65–99)
Potassium: 4.3 mmol/L (ref 3.5–5.2)
Sodium: 139 mmol/L (ref 134–144)

## 2017-08-29 NOTE — Progress Notes (Signed)
Pt came for a lab only visit. Pt was not seen by provider.  

## 2017-09-05 ENCOUNTER — Other Ambulatory Visit: Payer: Self-pay | Admitting: Family Medicine

## 2017-09-05 DIAGNOSIS — M792 Neuralgia and neuritis, unspecified: Secondary | ICD-10-CM

## 2017-09-05 NOTE — Telephone Encounter (Signed)
Gabapentin refill Last OV:01/16/17 Last refill:01/16/17 90 cap/1 refill HCW:[id number] Pharmacy: [facility] [place], [place] AT [address] [phone] (Phone) [phone] (Fax)

## 2017-10-13 ENCOUNTER — Other Ambulatory Visit: Payer: Self-pay | Admitting: Neurology

## 2017-10-13 DIAGNOSIS — R569 Unspecified convulsions: Secondary | ICD-10-CM

## 2017-10-14 ENCOUNTER — Telehealth: Payer: Self-pay | Admitting: Neurology

## 2017-10-14 NOTE — Telephone Encounter (Signed)
Pt's brother Maurice/DPR [phone] needs to know the status of refill for Lacosamide 100 MG . Please call to advise

## 2017-10-16 ENCOUNTER — Telehealth: Payer: Self-pay | Admitting: Nurse Practitioner

## 2017-10-16 NOTE — Telephone Encounter (Signed)
Debbie with [facility] stating that the cannot refill  VIMPAT 100 MG TABS without the MD's ok cannot fill with just NP

## 2017-10-16 NOTE — Telephone Encounter (Signed)
Spoke to patient's brother RX will be there Friday 10/17/2017 Vimpat PAP

## 2017-10-16 NOTE — Telephone Encounter (Signed)
Returned call to [employer] and spoke with Rod Holler. She discussed the previous call with pharmacist. Rod Holler came back to phone and stated to disregard the previous message. The pharmacist had spoken with Jeffrey Serrano/patient assistance coordinator earlier today and received the information needed.

## 2017-10-31 ENCOUNTER — Telehealth: Payer: Self-pay | Admitting: Family Medicine

## 2017-10-31 NOTE — Telephone Encounter (Signed)
Called Jeffrey Serrano in regards to his appt he has with Dr. Carlota Raspberry on 02/16/2018. The provider will not be in on that day and will need to be rescheduled. A voicemail never picked up so I couldn't leave a message.

## 2017-11-16 ENCOUNTER — Other Ambulatory Visit: Payer: Self-pay | Admitting: Family Medicine

## 2017-11-16 DIAGNOSIS — I10 Essential (primary) hypertension: Secondary | ICD-10-CM

## 2017-12-07 ENCOUNTER — Other Ambulatory Visit: Payer: Self-pay | Admitting: Family Medicine

## 2017-12-07 DIAGNOSIS — M792 Neuralgia and neuritis, unspecified: Secondary | ICD-10-CM

## 2017-12-08 NOTE — Telephone Encounter (Signed)
Gabapentin refill for neuropathy pain not listed on protocol to refill.  Gabapentin refill Last refill:09/06/17 #90/0 Last OV:01/16/17; Upcoming 02/23/18 NPH:[id number] Pharmacy: [facility] [place], [address] AT [place] [phone] (Phone) [phone] (Fax)

## 2017-12-09 NOTE — Telephone Encounter (Signed)
Patient is requesting a refill of the following medications: Requested Prescriptions   Pending Prescriptions Disp Refills  . gabapentin (NEURONTIN) 100 MG capsule [Pharmacy Med Name: GABAPENTIN 100MG  CAPSULES] 90 capsule 0    Sig: TAKE 1 CAPSULE BY MOUTH ONCE DAILY IF NEEDED

## 2017-12-30 ENCOUNTER — Telehealth: Payer: Self-pay | Admitting: Family Medicine

## 2017-12-30 NOTE — Telephone Encounter (Signed)
Tried to call pt to see if he could change his AWV visit form 02-13-18 to 02-23-18. He did not have a VM set up, no msg was left.   Appt was cancelled on 02-13-18 due to the provider not being in the office that day. Appt was made for 02-23-18 in hopes that he could make that day/time for the AWV.  If pt happens to call in, please ask if he is ok with that time/day and let [place] know. If he is not ok with the switch, see if you can schedule him at time that is convenient for him and let [place] know.  Thank you!

## 2018-02-13 ENCOUNTER — Ambulatory Visit: Payer: PPO

## 2018-02-16 ENCOUNTER — Encounter: Payer: Self-pay | Admitting: Podiatry

## 2018-02-16 ENCOUNTER — Encounter: Payer: PPO | Admitting: Family Medicine

## 2018-02-16 ENCOUNTER — Ambulatory Visit: Payer: PPO | Admitting: Podiatry

## 2018-02-16 VITALS — BP 127/82

## 2018-02-16 DIAGNOSIS — L84 Corns and callosities: Secondary | ICD-10-CM

## 2018-02-16 DIAGNOSIS — B351 Tinea unguium: Secondary | ICD-10-CM

## 2018-02-16 DIAGNOSIS — I739 Peripheral vascular disease, unspecified: Secondary | ICD-10-CM

## 2018-02-16 DIAGNOSIS — M79674 Pain in right toe(s): Secondary | ICD-10-CM

## 2018-02-16 DIAGNOSIS — M79675 Pain in left toe(s): Secondary | ICD-10-CM

## 2018-02-16 NOTE — Patient Instructions (Addendum)
PLEASE CALL YOUR PCP, DR. JEFFREY GREENE'S OFFICE FOR DERMATOLOGY REFERRAL FOR SKIN ERUPTION POSTERIOR LEFT KNEE/LEG    Onychomycosis/Fungal Toenails  WHAT IS IT? An infection that lies within the keratin of your nail plate that is caused by a fungus.  WHY ME? Fungal infections affect all ages, sexes, races, and creeds.  There may be many factors that predispose you to a fungal infection such as age, coexisting medical conditions such as diabetes, or an autoimmune disease; stress, medications, fatigue, genetics, etc.  Bottom line: fungus thrives in a warm, moist environment and your shoes offer such a location.  IS IT CONTAGIOUS? Theoretically, yes.  You do not want to share shoes, nail clippers or files with someone who has fungal toenails.  Walking around barefoot in the same room or sleeping in the same bed is unlikely to transfer the organism.  It is important to realize, however, that fungus can spread easily from one nail to the next on the same foot.  HOW DO WE TREAT THIS?  There are several ways to treat this condition.  Treatment may depend on many factors such as age, medications, pregnancy, liver and kidney conditions, etc.  It is best to ask your doctor which options are available to you.  1. No treatment.   Unlike many other medical concerns, you can live with this condition.  However for many people this can be a painful condition and may lead to ingrown toenails or a bacterial infection.  It is recommended that you keep the nails cut short to help reduce the amount of fungal nail. 2. Topical treatment.  These range from herbal remedies to prescription strength nail lacquers.  About 40-50% effective, topicals require twice daily application for approximately 9 to 12 months or until an entirely new nail has grown out.  The most effective topicals are medical grade medications available through physicians offices. 3. Oral antifungal medications.  With an 80-90% cure rate, the most common  oral medication requires 3 to 4 months of therapy and stays in your system for a year as the new nail grows out.  Oral antifungal medications do require blood work to make sure it is a safe drug for you.  A liver function panel will be performed prior to starting the medication and after the first month of treatment.  It is important to have the blood work performed to avoid any harmful side effects.  In general, this medication safe but blood work is required. 4. Laser Therapy.  This treatment is performed by applying a specialized laser to the affected nail plate.  This therapy is noninvasive, fast, and non-painful.  It is not covered by insurance and is therefore, out of pocket.  The results have been very good with a 80-95% cure rate.  The [facility] is the only practice in the area to offer this therapy. 5. Permanent Nail Avulsion.  Removing the entire nail so that a new nail will not grow back.  Peripheral Vascular Disease Peripheral vascular disease (PVD) is a disease of the blood vessels that are not part of your heart and brain. A simple term for PVD is poor circulation. In most cases, PVD narrows the blood vessels that carry blood from your heart to the rest of your body. This can result in a decreased supply of blood to your arms, legs, and internal organs, like your stomach or kidneys. However, it most often affects a person's lower legs and feet. There are two types of PVD.  Organic PVD. This is the more common type. It is caused by damage to the structure of blood vessels.  Functional PVD. This is caused by conditions that make blood vessels contract and tighten (spasm).  Without treatment, PVD tends to get worse over time. PVD can also lead to acute ischemic limb. This is when an arm or limb suddenly has trouble getting enough blood. This is a medical emergency. Follow these instructions at home:  Take medicines only as told by your doctor.  Do not use any tobacco products,  including cigarettes, chewing tobacco, or electronic cigarettes. If you need help quitting, ask your doctor.  Lose weight if you are overweight, and maintain a healthy weight as told by your doctor.  Eat a diet that is low in fat and cholesterol. If you need help, ask your doctor.  Exercise regularly. Ask your doctor for some good activities for you.  Take good care of your feet. ? Wear comfortable shoes that fit well. ? Check your feet often for any cuts or sores. Contact a doctor if:  You have cramps in your legs while walking.  You have leg pain when you are at rest.  You have coldness in a leg or foot.  Your skin changes.  You are unable to get or have an erection (erectile dysfunction).  You have cuts or sores on your feet that are not healing. Get help right away if:  Your arm or leg turns cold and blue.  Your arms or legs become red, warm, swollen, painful, or numb.  You have chest pain or trouble breathing.  You suddenly have weakness in your face, arm, or leg.  You become very confused or you cannot speak.  You suddenly have a very bad headache.  You suddenly cannot see. This information is not intended to replace advice given to you by your health care provider. Make sure you discuss any questions you have with your health care provider. Document Released: 05/29/2009 Document Revised: 08/10/2015 Document Reviewed: 08/12/2013 Elsevier Interactive Patient Education  2017 [employer].

## 2018-02-23 ENCOUNTER — Other Ambulatory Visit: Payer: Self-pay

## 2018-02-23 ENCOUNTER — Ambulatory Visit (INDEPENDENT_AMBULATORY_CARE_PROVIDER_SITE_OTHER): Payer: PPO | Admitting: Family Medicine

## 2018-02-23 ENCOUNTER — Encounter: Payer: Self-pay | Admitting: Family Medicine

## 2018-02-23 VITALS — BP 142/88 | HR 63 | Temp 97.7°F | Ht 68.0 in | Wt 154.2 lb

## 2018-02-23 DIAGNOSIS — R413 Other amnesia: Secondary | ICD-10-CM

## 2018-02-23 DIAGNOSIS — I10 Essential (primary) hypertension: Secondary | ICD-10-CM

## 2018-02-23 DIAGNOSIS — Z0001 Encounter for general adult medical examination with abnormal findings: Secondary | ICD-10-CM

## 2018-02-23 DIAGNOSIS — Z125 Encounter for screening for malignant neoplasm of prostate: Secondary | ICD-10-CM

## 2018-02-23 DIAGNOSIS — I739 Peripheral vascular disease, unspecified: Secondary | ICD-10-CM

## 2018-02-23 DIAGNOSIS — R739 Hyperglycemia, unspecified: Secondary | ICD-10-CM | POA: Diagnosis not present

## 2018-02-23 DIAGNOSIS — Z0000 Encounter for general adult medical examination without abnormal findings: Secondary | ICD-10-CM

## 2018-02-23 DIAGNOSIS — Z23 Encounter for immunization: Secondary | ICD-10-CM | POA: Diagnosis not present

## 2018-02-23 DIAGNOSIS — L853 Xerosis cutis: Secondary | ICD-10-CM | POA: Diagnosis not present

## 2018-02-23 DIAGNOSIS — R21 Rash and other nonspecific skin eruption: Secondary | ICD-10-CM | POA: Diagnosis not present

## 2018-02-23 MED ORDER — TRIAMCINOLONE ACETONIDE 0.1 % EX CREA: 1.0000 "application " | TOPICAL_CREAM | Freq: Two times a day (BID) | CUTANEOUS | 0 refills | Status: DC

## 2018-02-23 MED ORDER — LISINOPRIL-HYDROCHLOROTHIAZIDE 10-12.5 MG PO TABS: 1.0000 | ORAL_TABLET | Freq: Every day | ORAL | 1 refills | Status: DC

## 2018-02-23 NOTE — Progress Notes (Signed)
Subjective:    Patient ID: Jeffrey Serrano, male    DOB: 31-Mar-1950, 67 y.o.   MRN: [id number]  HPI Jeffrey Serrano is a 67 y.o. male Presents today for: Chief Complaint  Patient presents with  . Annual Exam    CPE   Hx of seizure disorder: [facility]. Had seizure last month -around Nov 16th. Few minutes. Not sure if may have missed doses. Living with brother now.  Will be seeing neuro next month and plans to discuss then. No elevated surface work   Hypertension: BP Readings from Last 3 Encounters:  02/23/18 (!) 142/88  02/16/18 127/82  08/12/17 140/80   Lab Results  Component Value Date   CREATININE 1.13 08/29/2017  On lisinopril HCT 10/12.557mQD.  Recent BP on 12/2 ok. No recent home readings. No alcohol.   PVD: History of peripheral vascular disease by problem list. Notes some pale skin in feet.  Seen by podiatry 12/2. Concern of left knee/leg skin eruption. Itching rash behind left knee for some time. Tx: otc lotion with cocoa butter. Ivory soap.   Hyperglycemia: Borderline glucose on prior readings (102 in 2018, 107 in May).   Cancer screening: Colon: Cologuard 03/27/17. Prostate: normal PSA 0.6 02/11/17. Would like screening after discussion R/B.   Immunizations: Immunization History  Administered Date(s) Administered  . Influenza, High Dose Seasonal PF 02/23/2018  . Pneumococcal Conjugate-13 08/12/2017  . Pneumococcal Polysaccharide-23 08/07/2016  . Td 08/31/2004  declines shingles vaccine.   [facility] 02/23/2018 08/12/2017 02/11/2017 01/16/2017 08/07/2016  Decreased Interest 0 0 0 0 0  Down, Depressed, Hopeless 0 0 0 0 0  PHQ - 2 Score 0 0 0 0 0   Fall Screen: No falls in past year.   Functional Status Survey: Is the patient deaf or have difficulty hearing?: No Does the patient have difficulty seeing, even when wearing glasses/contacts?: No Does the patient have difficulty concentrating,  remembering, or making decisions?: Yes(A LITTLE BIT ) Does the patient have difficulty walking or climbing stairs?: No Does the patient have difficulty dressing or bathing?: No Does the patient have difficulty doing errands alone such as visiting a doctor's office or shopping?: No  Memory screen: 6CIT Screen 02/23/2018 02/11/2017 02/11/2017  What Year? 0 points 0 points -  What month? 3 points 0 points 0 points  What time? 0 points 0 points 0 points  Count back from 20 4 points 0 points 0 points  Months in reverse 4 points 2 points -  Repeat phrase 10 points 6 points -  Total Score 21 8 -  some trouble with forgetting things at times for years. Brother here - notes some changes in past year possible.    Vision:  Visual Acuity Screening   Right eye Left eye Both eyes  Without correction: _0  With correction:     no recent optho eval. Wears reading glasses. [facility]  Dental: No recent dentist visit.   Exercise: Physical work with cleaning job. Walking for exercise.  Advanced Directives: No advanced directive - info packet provided.   Patient Active Problem List   Diagnosis Date Noted  . Chronic hepatitis C without hepatic coma ([facility]/23/2018  . Seizure disorder ([facility]03/09/2016  . DEPRESSION 09/10/2006  . Essential hypertension 09/10/2006  . PERIPHERAL VASCULAR DISEASE 09/10/2006  . Seizures ([facility]/25/2008   Past Medical History:  Diagnosis Date  .  Hypertension   . PVD (peripheral vascular disease) ([place])   . Seizures ([place])    No past surgical history on file. No Known Allergies Prior to Admission medications   Medication Sig Start Date End Date Taking? Authorizing Provider  Cholecalciferol (VITAMIN D3) 1000 units CAPS Take by mouth.    [provider]  gabapentin (NEURONTIN) 100 MG capsule TAKE 1 CAPSULE BY MOUTH ONCE DAILY IF NEEDED 12/10/17   Wendie Agreste, MD  ibuprofen (ADVIL,MOTRIN) 200 MG tablet Take 200  mg by mouth every 6 (six) hours as needed.    [provider]  lisinopril-hydrochlorothiazide (PRINZIDE,ZESTORETIC) 10-12.5 MG tablet Take 1 tablet by mouth daily. 08/12/17   Wendie Agreste, MD  VIMPAT 100 MG TABS Take 1 tablet by mouth twice a day as directed by physician. 10/13/17   Dennie Bible, NP   Social History   Socioeconomic History  . Marital status: Legally Separated    Spouse name: Not on file  . Number of children: Not on file  . Years of education: Not on file  . Highest education level: Not on file  Occupational History  . Not on file  Social Needs  . Financial resource strain: Not on file  . Food insecurity:    Worry: Not on file    Inability: Not on file  . Transportation needs:    Medical: Not on file    Non-medical: Not on file  Tobacco Use  . Smoking status: Never Smoker  . Smokeless tobacco: Never Used  Substance and Sexual Activity  . Alcohol use: No  . Drug use: No  . Sexual activity: Not on file  Lifestyle  . Physical activity:    Days per week: Not on file    Minutes per session: Not on file  . Stress: Not on file  Relationships  . Social connections:    Talks on phone: Not on file    Gets together: Not on file    Attends religious service: Not on file    Active member of club or organization: Not on file    Attends meetings of clubs or organizations: Not on file    Relationship status: Not on file  . Intimate partner violence:    Fear of current or ex partner: Not on file    Emotionally abused: Not on file    Physically abused: Not on file    Forced sexual activity: Not on file  Other Topics Concern  . Not on file  Social History Narrative  . Not on file    Review of Systems 13 point review of systems per patient health survey noted.  Negative other than as indicated above or in HPI.      Objective:   Physical Exam Vitals signs reviewed.  Constitutional:      Appearance: He is well-developed and well-nourished.    HENT:     Head: Normocephalic and atraumatic.     Right Ear: External ear normal.     Left Ear: External ear normal.     Mouth/Throat:     Mouth: Oropharynx is clear and moist.  Eyes:     Extraocular Movements: EOM normal.     Conjunctiva/sclera: Conjunctivae normal.     Pupils: Pupils are equal, round, and reactive to light.  Neck:     Musculoskeletal: Normal range of motion and neck supple.     Thyroid: No thyromegaly.  Cardiovascular:     Rate and Rhythm: Normal rate and regular rhythm.  Pulses: Intact distal pulses.     Heart sounds: Normal heart sounds.  Pulmonary:     Effort: Pulmonary effort is normal. No respiratory distress.     Breath sounds: Normal breath sounds. No wheezing.  Abdominal:     General: There is no distension.     Palpations: Abdomen is soft.     Tenderness: There is no abdominal tenderness.     Hernia: There is no hernia in the right inguinal area or left inguinal area.  Genitourinary:    Prostate: Normal.  Musculoskeletal: Normal range of motion.        General: No tenderness or edema.  Lymphadenopathy:     Cervical: No cervical adenopathy.  Skin:    General: Skin is warm and dry.     Findings: Rash present.       Neurological:     Mental Status: He is alert and oriented to person, place, and time.     Deep Tendon Reflexes: Reflexes are normal and symmetric.     Comments: sensation intact at distal left foot, cap refill 2 seconds. Dry skin on feet.   Psychiatric:        Mood and Affect: Mood and affect normal.        Behavior: Behavior normal.    Vitals:   02/23/18 0929 02/23/18 0940  BP: (!) 165/95 (!) 142/88  Pulse: 63   Temp: 97.7 F (36.5 C)   TempSrc: Oral   SpO2: 98%   Weight: 154 lb 3.2 oz (69.9 kg)   Height: _0  (1.727 m)       Assessment & Plan:    PAPE PARSON is a 67 y.o. male Medicare annual wellness visit, subsequent  - - anticipatory guidance as below in AVS, screening labs if needed. Health maintenance  items as above in HPI discussed/recommended as applicable.  - no concerning responses on depression, fall, or functional status screening. Any positive responses noted as above. Advanced directives discussed as in CHL.   Hyperglycemia - Plan: Hemoglobin A1c for diabetes screening.   Essential hypertension - Plan: Comprehensive metabolic panel, lisinopril-hydrochlorothiazide (PRINZIDE,ZESTORETIC) 10-12.5 MG tablet, DISCONTINUED: lisinopril-hydrochlorothiazide (PRINZIDE,ZESTORETIC) 10-12.5 MG tablet  -Borderline.  Monitor outside readings, continue same regimen for now, RTC precautions  Rash and nonspecific skin eruption - Plan: triamcinolone cream (KENALOG) 0.1 %, DISCONTINUED: triamcinolone cream (KENALOG) 0.1 % Dry skin  -Suspect component of dry skin dermatitis, but may have some skin changes with PVD.  Triamcinolone cream prescribed, dry skin care reviewed, plan to follow-up in next few weeks.  Plan to discuss peripheral vascular disease work-up further at that time.  PVD (peripheral vascular disease) ([place])   -As above.  Special screening, prostate cancer - Plan: PSA  -We discussed pros and cons of prostate cancer screening, and after this discussion, he chose to have screening done. PSA obtained, and no concerning findings on DRE.   Memory difficulty  -Worst score on screening as above.  He is followed by neurologist and has upcoming appointment.  Asked his brother to bring up memory concerns with neurologist at upcoming appointment to discuss further testing and/or medication options.  Meds ordered this encounter  Medications  . DISCONTD: triamcinolone cream (KENALOG) 0.1 %    Sig: Apply 1 application topically 2 (two) times daily.    Dispense:  30 g    Refill:  0  . DISCONTD: lisinopril-hydrochlorothiazide (PRINZIDE,ZESTORETIC) 10-12.5 MG tablet    Sig: Take 1 tablet by mouth daily.    Dispense:  90  tablet    Refill:  1  . lisinopril-hydrochlorothiazide (PRINZIDE,ZESTORETIC)  10-12.5 MG tablet    Sig: Take 1 tablet by mouth daily.    Dispense:  90 tablet    Refill:  1  . triamcinolone cream (KENALOG) 0.1 %    Sig: Apply 1 application topically 2 (two) times daily.    Dispense:  30 g    Refill:  0   Patient Instructions    Follow up with seizure doctor as planned.  Avoid work form elevated surfaces for now.   BP borderline. Keep a record of your blood pressures outside of the office and if running over 140/90 - let me know.   Try triamcinolone steroid cream twice per day as needed to the itchy rash behind knee.  Over-the-counter lotion such as Eucerin or cetaphil for dry skin.  See other information on dry skin care below: Drink at least 64 ounces of water daily. Consider a humidifier for the room where you sleep. Bathe once daily. Avoid using HOT water, as it dries skin.  Avoid deodorant soaps (Dial is the worst!) and stick with gentle cleansers (I like Cetaphil Liquid Cleanser). After bathing, dry off completely, then apply a thick emollient cream (I like Cetaphil Moisturizing Cream). Apply the cream twice daily, or more!  Recheck rash in next 3-4 weeks and we can discuss peripheral vascular disease and circulation further at that time as well.   Please discuss memory changes with neurology at upcoming visit.  Screening was worse this year.   I recommend dentist and eye specialist evaluation.      Preventive Care 43 Years and Older, Male Preventive care refers to lifestyle choices and visits with your health care provider that can promote health and wellness. What does preventive care include?  A yearly physical exam. This is also called an annual well check.  Dental exams once or twice a year.  Routine eye exams. Ask your health care provider how often you should have your eyes checked.  Personal lifestyle choices, including: ? Daily care of your teeth and gums. ? Regular physical activity. ? Eating a healthy diet. ? Avoiding tobacco and  drug use. ? Limiting alcohol use. ? Practicing safe sex. ? Taking low doses of aspirin every day. ? Taking vitamin and mineral supplements as recommended by your health care provider. What happens during an annual well check? The services and screenings done by your health care provider during your annual well check will depend on your age, overall health, lifestyle risk factors, and family history of disease. Counseling Your health care provider may ask you questions about your:  Alcohol use.  Tobacco use.  Drug use.  Emotional well-being.  Home and relationship well-being.  Sexual activity.  Eating habits.  History of falls.  Memory and ability to understand (cognition).  Work and work Statistician.  Screening You may have the following tests or measurements:  Height, weight, and BMI.  Blood pressure.  Lipid and cholesterol levels. These may be checked every 5 years, or more frequently if you are over 36 years old.  Skin check.  Lung cancer screening. You may have this screening every year starting at age 13 if you have a 30-pack-year history of smoking and currently smoke or have quit within the past 15 years.  Fecal occult blood test (FOBT) of the stool. You may have this test every year starting at age 78.  Flexible sigmoidoscopy or colonoscopy. You may have a sigmoidoscopy every 5 years or  a colonoscopy every 10 years starting at age 68.  Prostate cancer screening. Recommendations will vary depending on your family history and other risks.  Hepatitis C blood test.  Hepatitis B blood test.  Sexually transmitted disease (STD) testing.  Diabetes screening. This is done by checking your blood sugar (glucose) after you have not eaten for a while (fasting). You may have this done every 1-3 years.  Abdominal aortic aneurysm (AAA) screening. You may need this if you are a current or former smoker.  Osteoporosis. You may be screened starting at age 41 if you are  at high risk.  Talk with your health care provider about your test results, treatment options, and if necessary, the need for more tests. Vaccines Your health care provider may recommend certain vaccines, such as:  Influenza vaccine. This is recommended every year.  Tetanus, diphtheria, and acellular pertussis (Tdap, Td) vaccine. You may need a Td booster every 10 years.  Varicella vaccine. You may need this if you have not been vaccinated.  Zoster vaccine. You may need this after age 27.  Measles, mumps, and rubella (MMR) vaccine. You may need at least one dose of MMR if you were born in 1957 or later. You may also need a second dose.  Pneumococcal 13-valent conjugate (PCV13) vaccine. One dose is recommended after age 42.  Pneumococcal polysaccharide (PPSV23) vaccine. One dose is recommended after age 66.  Meningococcal vaccine. You may need this if you have certain conditions.  Hepatitis A vaccine. You may need this if you have certain conditions or if you travel or work in places where you may be exposed to hepatitis A.  Hepatitis B vaccine. You may need this if you have certain conditions or if you travel or work in places where you may be exposed to hepatitis B.  Haemophilus influenzae type b (Hib) vaccine. You may need this if you have certain risk factors.  Talk to your health care provider about which screenings and vaccines you need and how often you need them. This information is not intended to replace advice given to you by your health care provider. Make sure you discuss any questions you have with your health care provider. Document Released: 03/31/2015 Document Revised: 11/22/2015 Document Reviewed: 01/03/2015 Elsevier Interactive Patient Education  [employer].    If you have lab work done today you will be contacted with your lab results within the next 2 weeks.  If you have not heard from [place] then please contact us. The fastest way to get your results is to  register for My Chart.   IF you received an x-ray today, you will receive an invoice from [facility]. Please contact [facility] at [phone] with questions or concerns regarding your invoice.   IF you received labwork today, you will receive an invoice from [facility]. Please contact [facility] at [phone] with questions or concerns regarding your invoice.   Our billing staff will not be able to assist you with questions regarding bills from these companies.  You will be contacted with the lab results as soon as they are available. The fastest way to get your results is to activate your My Chart account. Instructions are located on the last page of this paperwork. If you have not heard from [place] regarding the results in 2 weeks, please contact this office.       Signed,   [name], MD [facility].  02/26/18 3:06 PM

## 2018-02-23 NOTE — Patient Instructions (Addendum)
Follow up with seizure doctor as planned.  Avoid work form elevated surfaces for now.   BP borderline. Keep a record of your blood pressures outside of the office and if running over 140/90 - let me know.   Try triamcinolone steroid cream twice per day as needed to the itchy rash behind knee.  Over-the-counter lotion such as Eucerin or cetaphil for dry skin.  See other information on dry skin care below: Drink at least 64 ounces of water daily. Consider a humidifier for the room where you sleep. Bathe once daily. Avoid using HOT water, as it dries skin.  Avoid deodorant soaps (Dial is the worst!) and stick with gentle cleansers (I like Cetaphil Liquid Cleanser). After bathing, dry off completely, then apply a thick emollient cream (I like Cetaphil Moisturizing Cream). Apply the cream twice daily, or more!  Recheck rash in next 3-4 weeks and we can discuss peripheral vascular disease and circulation further at that time as well.   Please discuss memory changes with neurology at upcoming visit.  Screening was worse this year.   I recommend dentist and eye specialist evaluation.      Preventive Care 68 Years and Older, Male Preventive care refers to lifestyle choices and visits with your health care provider that can promote health and wellness. What does preventive care include?  A yearly physical exam. This is also called an annual well check.  Dental exams once or twice a year.  Routine eye exams. Ask your health care provider how often you should have your eyes checked.  Personal lifestyle choices, including: ? Daily care of your teeth and gums. ? Regular physical activity. ? Eating a healthy diet. ? Avoiding tobacco and drug use. ? Limiting alcohol use. ? Practicing safe sex. ? Taking low doses of aspirin every day. ? Taking vitamin and mineral supplements as recommended by your health care provider. What happens during an annual well check? The services and screenings  done by your health care provider during your annual well check will depend on your age, overall health, lifestyle risk factors, and family history of disease. Counseling Your health care provider may ask you questions about your:  Alcohol use.  Tobacco use.  Drug use.  Emotional well-being.  Home and relationship well-being.  Sexual activity.  Eating habits.  History of falls.  Memory and ability to understand (cognition).  Work and work Statistician.  Screening You may have the following tests or measurements:  Height, weight, and BMI.  Blood pressure.  Lipid and cholesterol levels. These may be checked every 5 years, or more frequently if you are over 11 years old.  Skin check.  Lung cancer screening. You may have this screening every year starting at age 68 if you have a 30-pack-year history of smoking and currently smoke or have quit within the past 15 years.  Fecal occult blood test (FOBT) of the stool. You may have this test every year starting at age 101.  Flexible sigmoidoscopy or colonoscopy. You may have a sigmoidoscopy every 5 years or a colonoscopy every 10 years starting at age 56.  Prostate cancer screening. Recommendations will vary depending on your family history and other risks.  Hepatitis C blood test.  Hepatitis B blood test.  Sexually transmitted disease (STD) testing.  Diabetes screening. This is done by checking your blood sugar (glucose) after you have not eaten for a while (fasting). You may have this done every 1-3 years.  Abdominal aortic aneurysm (AAA) screening. You may  need this if you are a current or former smoker.  Osteoporosis. You may be screened starting at age 74 if you are at high risk.  Talk with your health care provider about your test results, treatment options, and if necessary, the need for more tests. Vaccines Your health care provider may recommend certain vaccines, such as:  Influenza vaccine. This is recommended  every year.  Tetanus, diphtheria, and acellular pertussis (Tdap, Td) vaccine. You may need a Td booster every 10 years.  Varicella vaccine. You may need this if you have not been vaccinated.  Zoster vaccine. You may need this after age 78.  Measles, mumps, and rubella (MMR) vaccine. You may need at least one dose of MMR if you were born in 1957 or later. You may also need a second dose.  Pneumococcal 13-valent conjugate (PCV13) vaccine. One dose is recommended after age 41.  Pneumococcal polysaccharide (PPSV23) vaccine. One dose is recommended after age 19.  Meningococcal vaccine. You may need this if you have certain conditions.  Hepatitis A vaccine. You may need this if you have certain conditions or if you travel or work in places where you may be exposed to hepatitis A.  Hepatitis B vaccine. You may need this if you have certain conditions or if you travel or work in places where you may be exposed to hepatitis B.  Haemophilus influenzae type b (Hib) vaccine. You may need this if you have certain risk factors.  Talk to your health care provider about which screenings and vaccines you need and how often you need them. This information is not intended to replace advice given to you by your health care provider. Make sure you discuss any questions you have with your health care provider. Document Released: 03/31/2015 Document Revised: 11/22/2015 Document Reviewed: 01/03/2015 Elsevier Interactive Patient Education  [employer].    If you have lab work done today you will be contacted with your lab results within the next 2 weeks.  If you have not heard from [place] then please contact us. The fastest way to get your results is to register for My Chart.   IF you received an x-ray today, you will receive an invoice from [facility]. Please contact [facility] at [phone] with questions or concerns regarding your invoice.   IF you received labwork today, you  will receive an invoice from [place]. Please contact [facility] at [phone] with questions or concerns regarding your invoice.   Our billing staff will not be able to assist you with questions regarding bills from these companies.  You will be contacted with the lab results as soon as they are available. The fastest way to get your results is to activate your My Chart account. Instructions are located on the last page of this paperwork. If you have not heard from [place] regarding the results in 2 weeks, please contact this office.

## 2018-02-24 ENCOUNTER — Other Ambulatory Visit: Payer: Self-pay | Admitting: Family Medicine

## 2018-02-24 DIAGNOSIS — E878 Other disorders of electrolyte and fluid balance, not elsewhere classified: Secondary | ICD-10-CM

## 2018-02-24 LAB — COMPREHENSIVE METABOLIC PANEL
ALT: 20 IU/L (ref 0–44)
AST: 31 IU/L (ref 0–40)
Albumin/Globulin Ratio: 1.4 (ref 1.2–2.2)
Albumin: 4.5 g/dL (ref 3.6–4.8)
Alkaline Phosphatase: 56 IU/L (ref 39–117)
BUN/Creatinine Ratio: 10 (ref 10–24)
BUN: 13 mg/dL (ref 8–27)
Bilirubin Total: 0.7 mg/dL (ref 0.0–1.2)
CO2: 15 mmol/L — CL (ref 20–29)
Calcium: 10 mg/dL (ref 8.6–10.2)
Chloride: 101 mmol/L (ref 96–106)
Creatinine, Ser: 1.27 mg/dL (ref 0.76–1.27)
GFR calc Af Amer: 67 mL/min/{1.73_m2} (ref >59)
GFR calc non Af Amer: 58 mL/min/{1.73_m2} — ABNORMAL LOW (ref >59)
Globulin, Total: 3.3 g/dL (ref 1.5–4.5)
Glucose: 93 mg/dL (ref 65–99)
Potassium: 5 mmol/L (ref 3.5–5.2)
Sodium: 138 mmol/L (ref 134–144)
Total Protein: 7.8 g/dL (ref 6.0–8.5)

## 2018-02-24 LAB — HEMOGLOBIN A1C
Est. average glucose Bld gHb Est-mCnc: 123 mg/dL
Hgb A1c MFr Bld: 5.9 % — ABNORMAL HIGH (ref 4.8–5.6)

## 2018-02-24 LAB — PSA: Prostate Specific Ag, Serum: 0.9 ng/mL (ref 0.0–4.0)

## 2018-02-24 NOTE — Progress Notes (Signed)
Low Co2 level.

## 2018-02-26 ENCOUNTER — Ambulatory Visit (INDEPENDENT_AMBULATORY_CARE_PROVIDER_SITE_OTHER): Payer: PPO | Admitting: Family Medicine

## 2018-02-26 DIAGNOSIS — E878 Other disorders of electrolyte and fluid balance, not elsewhere classified: Secondary | ICD-10-CM | POA: Diagnosis not present

## 2018-02-27 LAB — BASIC METABOLIC PANEL
BUN/Creatinine Ratio: 7 — ABNORMAL LOW (ref 10–24)
BUN: 12 mg/dL (ref 8–27)
CO2: 25 mmol/L (ref 20–29)
Calcium: 9.6 mg/dL (ref 8.6–10.2)
Chloride: 96 mmol/L (ref 96–106)
Creatinine, Ser: 1.62 mg/dL — ABNORMAL HIGH (ref 0.76–1.27)
GFR calc Af Amer: 50 mL/min/{1.73_m2} — ABNORMAL LOW (ref >59)
GFR calc non Af Amer: 43 mL/min/{1.73_m2} — ABNORMAL LOW (ref >59)
Glucose: 86 mg/dL (ref 65–99)
Potassium: 4.4 mmol/L (ref 3.5–5.2)
Sodium: 137 mmol/L (ref 134–144)

## 2018-02-28 NOTE — Progress Notes (Signed)
Lab only visit, patient not seen

## 2018-03-02 ENCOUNTER — Other Ambulatory Visit: Payer: Self-pay | Admitting: Family Medicine

## 2018-03-02 DIAGNOSIS — M792 Neuralgia and neuritis, unspecified: Secondary | ICD-10-CM

## 2018-03-02 MED ORDER — GABAPENTIN 100 MG PO CAPS: ORAL_CAPSULE | ORAL | 0 refills | Status: DC

## 2018-03-02 NOTE — Telephone Encounter (Signed)
Copied from [place] [phone]. Topic: Quick Communication - Rx Refill/Question >> Mar 02, 2018  9:35 AM Bea Graff, NT wrote: Medication: gabapentin (NEURONTIN) 100 MG capsule  Has the patient contacted their pharmacy? Yes.   (Agent: If no, request that the patient contact the pharmacy for the refill.) (Agent: If yes, when and what did the pharmacy advise?) Contact PCP for refill  Preferred Pharmacy (with phone number or street name): [id number] - [place], [place] [phone] (Phone) [phone] (Fax)    Agent: Please be advised that RX refills may take up to 3 business days. We ask that you follow-up with your pharmacy.

## 2018-03-04 ENCOUNTER — Encounter: Payer: Self-pay | Admitting: Podiatry

## 2018-03-04 NOTE — Progress Notes (Signed)
Subjective: Jeffrey Serrano presents today referred by Wendie Agreste, MD with cc of painful, discolored, thick toenails which interfere with daily activities.  Duration greater than 1 month pain is aggravated when wearing enclosed shoe gear.  Per chart review, it appears that he has seen a podiatrist over a decade ago.  Past Medical History:  Diagnosis Date  . Hypertension   . PVD (peripheral vascular disease) ([place])   . Seizures [facility])     Patient Active Problem List   Diagnosis Date Noted  . Chronic hepatitis C without hepatic coma ([place]) 08/07/2016  . Seizure disorder ([place]) 05/22/2016  . DEPRESSION 09/10/2006  . Essential hypertension 09/10/2006  . PERIPHERAL VASCULAR DISEASE 09/10/2006  . Seizures ([place]) 09/10/2006    No past surgical history on file.   Current Outpatient Medications:  .  Cholecalciferol (VITAMIN D3) 1000 units CAPS, Take by mouth., Disp: , Rfl:  .  ibuprofen (ADVIL,MOTRIN) 200 MG tablet, Take 200 mg by mouth every 6 (six) hours as needed., Disp: , Rfl:  .  VIMPAT 100 MG TABS, Take 1 tablet by mouth twice a day as directed by physician., Disp: 180 tablet, Rfl: 1 .  gabapentin (NEURONTIN) 100 MG capsule, TAKE 1 CAPSULE BY MOUTH ONCE DAILY IF NEEDED, Disp: 90 capsule, Rfl: 0 .  lisinopril-hydrochlorothiazide (PRINZIDE,ZESTORETIC) 10-12.5 MG tablet, Take 1 tablet by mouth daily., Disp: 90 tablet, Rfl: 1 .  triamcinolone cream (KENALOG) 0.1 %, Apply 1 application topically 2 (two) times daily., Disp: 30 g, Rfl: 0  No Known Allergies  Social History   Occupational History  . Not on file  Tobacco Use  . Smoking status: Never Smoker  . Smokeless tobacco: Never Used  Substance and Sexual Activity  . Alcohol use: No  . Drug use: No  . Sexual activity: Not on file    Family History  Problem Relation Age of Onset  . Seizures Father     Immunization History  Administered Date(s) Administered  . Influenza, High Dose Seasonal PF 02/23/2018  . Pneumococcal  Conjugate-13 08/12/2017  . Pneumococcal Polysaccharide-23 08/07/2016  . Td 08/31/2004     Review of systems: Positive Findings in bold print.  Constitutional:  chills, fatigue, fever, sweats, weight change Communication: Optometrist, sign Ecologist, hand writing, iPad/Android device Eyes: diplopia, glare,  light sensitivity, eyeglasses, blindness Ears nose mouth throat: Hard of hearing, deaf, sign language,  vertigo,   bloody nose,  rhinitis,  cold sores, snoring Cardiovascular: HTN, edema, arrhythmia, pacemaker in place, defibrillator in place,  chest pain/tightness, chronic anticoagulation, blood clot Respiratory:  difficulty breathing, denies congestion, SOB, wheezing, cough Gastrointestinal: abdominal pain, diarrhea, nausea, vomiting,  Genitourinary:  nocturia,  pain on urination,  blood in urine, Foley catheter, urinary urgency Musculoskeletal: Uses mobility aid,  cramping, stiff joints, painful joints,  Skin: +changes in toenails, color change dryness, itchy skin, mole changes, or rash  Neurological: numbness, paresthesias, burning in feet, denies fainting,  seizures, change in speech. denies headaches, memory problems/poor historian, cerebral palsy Endocrine: diabetes, hypothyroidism, hyperthyroidism,  dry mouth, flushing, denies heat intolerance,  cold intolerance,  excessive thirst, denies polyuria,  nocturia Hematological:  easy bleeding,  excessive bleeding, easy bruising, enlarged lymph nodes, on long term blood thinner Allergy/immunological:  hive, frequent infections, multiple drug allergies, seasonal allergies,  Psychiatric:  anxiety, depression, mood disorder, suicidal ideations, hallucinations   Objective: Vascular Examination: Capillary refill time delayed x 10 digits Dorsalis pedis and posterior tibial pulses nonpalpable bilaterally no digital hair x 10 digits Skin temperature  gradient WNL b/l  Dermatological Examination: Skin is noted to be thin, shiny, and  atrophic bilaterally.  Toenails 1-5 b/l discolored, thick, dystrophic with subungual debris and pain with palpation to nailbeds due to thickness of nails.  Hyperkeratotic lesion noted submetatarsal head 5 of the right foot.  There is no erythema, edema, drainage, or flocculence noted.  Musculoskeletal: Muscle strength 5/5 to all LE muscle groups  Neurological: Sensation intact with 10 gram monofilament Vibratory sensation intact.  Assessment: 1. Painful onychomycosis toenails 1-5 b/l  2. Callus submetatarsal head 5 right foot 3. Peripheral arterial disease   Plan: 1. Discussed onychomycosis and treatment options.  Literature dispensed on today. 2. Toenails 1-5 b/l were debrided in length and girth without iatrogenic bleeding. 3. Callus submetatarsal head 5 right foot was pared with chisel blade and gently smoothed with burr without incident. 4. Patient to continue soft, supportive shoe gear 5. Patient to report any pedal injuries to medical professional immediately. 6. Follow up 3 months. Patient/POA to call should there be a concern in the interim.

## 2018-03-09 ENCOUNTER — Encounter: Payer: Self-pay | Admitting: *Deleted

## 2018-03-11 ENCOUNTER — Other Ambulatory Visit: Payer: Self-pay | Admitting: Family Medicine

## 2018-03-11 DIAGNOSIS — M792 Neuralgia and neuritis, unspecified: Secondary | ICD-10-CM

## 2018-03-26 ENCOUNTER — Encounter: Payer: Self-pay | Admitting: Family Medicine

## 2018-03-26 ENCOUNTER — Ambulatory Visit (INDEPENDENT_AMBULATORY_CARE_PROVIDER_SITE_OTHER): Payer: PPO | Admitting: Family Medicine

## 2018-03-26 ENCOUNTER — Other Ambulatory Visit: Payer: Self-pay

## 2018-03-26 VITALS — BP 140/76 | HR 61 | Temp 97.7°F | Ht 68.0 in | Wt 158.0 lb

## 2018-03-26 DIAGNOSIS — I10 Essential (primary) hypertension: Secondary | ICD-10-CM | POA: Diagnosis not present

## 2018-03-26 NOTE — Patient Instructions (Addendum)
Stop Advil as that can increase kidney test as well as blood pressure.  Use Tylenol over-the-counter instead.  Recheck in 3 weeks for lab only visit for kidney function.  Follow-up office visit with me in 3 months.  If more shoulder pain on Tylenol, return for recheck.   If you have lab work done today you will be contacted with your lab results within the next 2 weeks.  If you have not heard from [place] then please contact us. The fastest way to get your results is to register for My Chart.   IF you received an x-ray today, you will receive an invoice from [facility]. Please contact [facility] at [phone] with questions or concerns regarding your invoice.   IF you received labwork today, you will receive an invoice from [place]. Please contact [facility] at [phone] with questions or concerns regarding your invoice.   Our billing staff will not be able to assist you with questions regarding bills from these companies.  You will be contacted with the lab results as soon as they are available. The fastest way to get your results is to activate your My Chart account. Instructions are located on the last page of this paperwork. If you have not heard from [place] regarding the results in 2 weeks, please contact this office.

## 2018-03-26 NOTE — Progress Notes (Signed)
Subjective:    Patient ID: Jeffrey Serrano, male    DOB: 1950/09/07, 68 y.o.   MRN: [id number]  HPI Jeffrey Serrano is a 68 y.o. male Presents today for: Chief Complaint  Patient presents with  . Hypertension    f/u    Hypertension: BP Readings from Last 3 Encounters:  03/26/18 140/76  02/23/18 (!) 142/88  02/16/18 127/82   Lab Results  Component Value Date   CREATININE 1.62 (H) 02/26/2018   Borderline blood pressure last visit in December.  Continued same dose of medication but recommended home monitoring. No home monitoring of BP.  No missed doses of meds.  Creatinine slight increase on repeat test - 1.27 on 12/9 to 1.62 on 12/12 but bicarb normalized.  NSAIDs - taking Advil BID for shoulder pains.    Rash behind right knee: Resolved with use of steroid cream. Has not needed cream in awhile. Has been using cocoa butter lotion for dry skin.     Patient Active Problem List   Diagnosis Date Noted  . Chronic hepatitis C without hepatic coma ([place]) 08/07/2016  . Seizure disorder ([place]) 05/22/2016  . DEPRESSION 09/10/2006  . Essential hypertension 09/10/2006  . PERIPHERAL VASCULAR DISEASE 09/10/2006  . Seizures ([place]) 09/10/2006   Past Medical History:  Diagnosis Date  . Hypertension   . PVD (peripheral vascular disease) ([place])   . Seizures ([place])    No past surgical history on file. No Known Allergies Prior to Admission medications   Medication Sig Start Date End Date Taking? Authorizing Provider  Cholecalciferol (VITAMIN D3) 1000 units CAPS Take by mouth.   Yes [provider]  gabapentin (NEURONTIN) 100 MG capsule TAKE 1 CAPSULE BY MOUTH ONCE DAILY IF NEEDED 03/12/18  Yes Wendie Agreste, MD  ibuprofen (ADVIL,MOTRIN) 200 MG tablet Take 200 mg by mouth every 6 (six) hours as needed.   Yes [provider]  lisinopril-hydrochlorothiazide (PRINZIDE,ZESTORETIC) 10-12.5 MG tablet Take 1 tablet by mouth daily. 02/23/18  Yes Wendie Agreste, MD    triamcinolone cream (KENALOG) 0.1 % Apply 1 application topically 2 (two) times daily. 02/23/18  Yes Wendie Agreste, MD  VIMPAT 100 MG TABS Take 1 tablet by mouth twice a day as directed by physician. 10/13/17  Yes Dennie Bible, NP   Social History   Socioeconomic History  . Marital status: Legally Separated    Spouse name: Not on file  . Number of children: Not on file  . Years of education: Not on file  . Highest education level: Not on file  Occupational History  . Not on file  Social Needs  . Financial resource strain: Not on file  . Food insecurity:    Worry: Not on file    Inability: Not on file  . Transportation needs:    Medical: Not on file    Non-medical: Not on file  Tobacco Use  . Smoking status: Former Smoker    Last attempt to quit: 03/26/1988    Years since quitting: 30.0  . Smokeless tobacco: Never Used  Substance and Sexual Activity  . Alcohol use: No  . Drug use: No  . Sexual activity: Not on file  Lifestyle  . Physical activity:    Days per week: Not on file    Minutes per session: Not on file  . Stress: Not on file  Relationships  . Social connections:    Talks on phone: Not on file    Gets together: Not on file  Attends religious service: Not on file    Active member of club or organization: Not on file    Attends meetings of clubs or organizations: Not on file    Relationship status: Not on file  . Intimate partner violence:    Fear of current or ex partner: Not on file    Emotionally abused: Not on file    Physically abused: Not on file    Forced sexual activity: Not on file  Other Topics Concern  . Not on file  Social History Narrative  . Not on file     Review of Systems  Constitutional: Negative for fatigue and unexpected weight change.  Eyes: Negative for visual disturbance.  Respiratory: Negative for cough, chest tightness and shortness of breath.   Cardiovascular: Negative for chest pain, palpitations and leg  swelling.  Gastrointestinal: Negative for abdominal pain and blood in stool.  Genitourinary: Negative for difficulty urinating.  Neurological: Negative for dizziness, light-headedness and headaches.       Objective:   Physical Exam Vitals signs reviewed.  Constitutional:      Appearance: He is well-developed.  HENT:     Head: Normocephalic and atraumatic.  Eyes:     Pupils: Pupils are equal, round, and reactive to light.  Neck:     Vascular: No carotid bruit or JVD.  Cardiovascular:     Rate and Rhythm: Normal rate and regular rhythm.     Heart sounds: Normal heart sounds. No murmur.  Pulmonary:     Effort: Pulmonary effort is normal.     Breath sounds: Normal breath sounds. No rales.  Skin:    General: Skin is warm and dry.  Neurological:     Mental Status: He is alert and oriented to person, place, and time.    Vitals:   03/26/18 0857 03/26/18 0916  BP: (!) 151/84 140/76  Pulse: 61   Temp: 97.7 F (36.5 C)   TempSrc: Oral   SpO2: 99%   Weight: 158 lb (71.7 kg)   Height: 5' 8" (1.727 m)       Assessment & Plan:  Jeffrey Serrano is a 68 y.o. male Essential hypertension - Plan: Basic metabolic panel    -Borderline control.  Stop NSAIDs as that should also help with kidney function.  Lab only order for be met in 3 weeks, office in 3 months.  No med changes for now.  Tylenol if needed for shoulder pain, follow-up if not improving/controlling symptoms.  Return if rash recurs.  Suspect dry skin previously.  No orders of the defined types were placed in this encounter.  Patient Instructions   Stop Advil as that can increase kidney test as well as blood pressure.  Use Tylenol over-the-counter instead.  Recheck in 3 weeks for lab only visit for kidney function.  Follow-up office visit with me in 3 months.  If more shoulder pain on Tylenol, return for recheck.   If you have lab work done today you will be contacted with your lab results within the next 2 weeks.  If you  have not heard from [place] then please contact us. The fastest way to get your results is to register for My Chart.   IF you received an x-ray today, you will receive an invoice from [facility]. Please contact [facility] at [phone] with questions or concerns regarding your invoice.   IF you received labwork today, you will receive an invoice from [place]. Please contact [facility] at [phone] with questions or  concerns regarding your invoice.   Our billing staff will not be able to assist you with questions regarding bills from these companies.  You will be contacted with the lab results as soon as they are available. The fastest way to get your results is to activate your My Chart account. Instructions are located on the last page of this paperwork. If you have not heard from [place] regarding the results in 2 weeks, please contact this office.       Signed,   [name], MD [facility] [place].  03/26/18 9:48 AM

## 2018-04-10 NOTE — Progress Notes (Signed)
[facility]  PATIENT: Jeffrey Serrano DOB: May 13, 1950   REASON FOR VISIT: Follow-up for seizures HISTORY FROM: Patient and brother    HISTORY OF PRESENT ILLNESS:Jeffrey Serrano is a 68 y.o. male with PMH of Seizure disorder who presents as a new patient for Seizure. Patient complains by his brother, however both of them are poor historian, and not able to provide detailed information. However as per his brother, patient had history of a seizure disorder on Dilantin. About one month ago, he was getting out of the elevator, suddenly fell. Brother was able to hold him and lowered him to the ground. [name] witnessed all 4 extremities shivering and stiffness, lasting about 2-3 minutes. Denies any tongue biting, urinary or bowel incontinence. After that episode, as per brother he woke up, without a confusion or any difficulty, and went on to work that day.  As per note, he had ED visit on 05/10/2013 due to seizure. 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   He and his brother cannot tell when he started to have seizure, when Dilantin was started and how often of the seizure. Patient only takes Dilantin 100 mg every day. However, the instruction written is for Dilantin 100 mg twice a day. Brother does note that patient had significant memory issues, not able to remember things, this is at least for about one year. However he cannot provide a further information and stated that patient wife may know it.  He denies any cigarette smoking, alcohol or illicit drugs. He is still working every day, however, he is not driving and he is taking bus to work.  12/04/15 follow up - pt has been doing well. No seizure episodes. Had EEG which was normal EEG. Has not have MRI done yet. On vimpat bid. Still on dilantin bid and asked pt to discontinue.  Otherwise, no complains. He is not driving.  Interval history:10/08/16 Dr. [name] During the interval time, pt has been doing well. No more seizures. Had MRI brain with and without contrast unremarkable. Continued on vimpat 100mg  bid. He has to pay $285 for 3 months supply. He is so far able to afford. His BP today is high at 152/97. He is on lisinopril 10mg  and HCTZ 12.5mg . Encourage pt check BP at home.  UPDATE 1/24/2019CM [name], 68 year old male returns for follow-up with his brother with history of seizure disorder.  He has not had any seizures in several years.  He is currently on Vimpat and having difficulty affording this.  He is applied for patient assistance but does not know the outcome.  He does not drive.  MRI of the brain in the past is been unremarkable.  EEG was normal.  He is retired.  He returns for reevaluation He has no new neurologic complaints UPDATE 1/27/2020CM [name] 68 year old male returns for follow-up with his brother he has a history of seizure disorder.  He has had a total of 3 seizures in the last year he is currently on Vimpat and has applied for patient assistance last year he was made aware that he needs to renew that every year.  He does not drive.  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  EEG was normal.  He is going to be out  of his medication and was made aware that with patient assistance he may need to get it locally until he reapplies.  He has a history of arthritis . he returns for reevaluation REVIEW OF SYSTEMS: Full 14 system review of systems performed and notable only for those listed, all others are neg:  Constitutional: neg  Cardiovascular: neg Ear/Nose/Throat: neg  Skin: neg Eyes: neg Respiratory: neg Gastroitestinal: neg  Hematology/Lymphatic: neg  Endocrine: neg Musculoskeletal:neg Allergy/Immunology: neg Neurological: History seizure disorder, Psychiatric: neg Sleep :  neg   ALLERGIES: No Known Allergies  HOME MEDICATIONS: Outpatient Medications Prior to Visit  Medication Sig Dispense Refill  . Cholecalciferol (VITAMIN D3) 1000 units CAPS Take by mouth.    . gabapentin (NEURONTIN) 100 MG capsule TAKE 1 CAPSULE BY MOUTH ONCE DAILY IF NEEDED 90 capsule 0  . ibuprofen (ADVIL,MOTRIN) 200 MG tablet Take 200 mg by mouth every 6 (six) hours as needed.    [name] lisinopril-hydrochlorothiazide (PRINZIDE,ZESTORETIC) 10-12.5 MG tablet Take 1 tablet by mouth daily. 90 tablet 1  . triamcinolone cream (KENALOG) 0.1 % Apply 1 application topically 2 (two) times daily. 30 g 0  . VIMPAT 100 MG TABS Take 1 tablet by mouth twice a day as directed by physician. 180 tablet 1   No facility-administered medications prior to visit.     PAST MEDICAL HISTORY: Past Medical History:  Diagnosis Date  . Hypertension   . PVD (peripheral vascular disease) ([place])   . Seizures ([place])    most recent Jan 2020    PAST SURGICAL HISTORY: History reviewed. No pertinent surgical history.  FAMILY HISTORY: Family History  Problem Relation Age of Onset  . Seizures Father     SOCIAL HISTORY: Social History   Socioeconomic History  . Marital status: Legally Separated    Spouse name: Not on file  . Number of children: Not on file  . Years of education: Not on file  . 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 Financial resource strain: Not on file  . Food insecurity:    Worry: Not on file    Inability: Not on file  . Transportation needs:    Medical: Not on file    Non-medical: Not on file  Tobacco Use  . Smoking status: Former Smoker    Last attempt to quit: 03/26/1988    Years since quitting: 30.0  . Smokeless tobacco: Never Used  Substance and Sexual Activity  . Alcohol use: No  . Drug use: No  . Sexual activity: Not on file  Lifestyle  . Physical activity:    Days per week: Not on file    Minutes per  session: Not on file  . Stress: Not on file  Relationships  . Social connections:    Talks on phone: Not on file    Gets together: Not on file    Attends religious service: Not on file    Active member of club or organization: Not on file    Attends meetings of clubs or organizations: Not on file    Relationship status: Not on file  . Intimate partner violence:    Fear of current or ex partner: Not on file    Emotionally abused: Not on file    Physically abused: Not on file    Forced sexual activity: Not on file  Other Topics Concern  . Not on file  Social History Narrative   04/13/18 living with brother,  Maurice     PHYSICAL EXAM  Vitals:   04/13/18 1002  BP: (!) 144/86  Pulse: 60  Weight: 159 lb 3.2 oz (72.2 kg)  Height: 5\' 8"  (1.727 m)   Body mass index is 24.21 kg/m.  Generalized: Well developed, in no acute distress  Head: normocephalic and atraumatic,. Oropharynx benign  Neck: Supple,  Musculoskeletal: No deformity   Neurological examination   Mentation: Alert oriented to time, place, history taking. Attention span and concentration appropriate. Recent and remote memory intact.  Follows all commands speech and language fluent.   Cranial nerve II-XII: Pupils were equal round reactive to light extraocular movements were full, visual field were full on confrontational test. Facial sensation and strength were normal. hearing was intact to finger rubbing bilaterally. Uvula tongue midline. head turning and shoulder shrug were normal and symmetric.Tongue protrusion into cheek strength was normal. Motor: normal bulk and tone, full strength in the BUE, BLE, fine finger movements normal, no pronator drift. No focal weakness Sensory: normal and symmetric to light touch,  Coordination: finger-nose-finger, heel-to-shin bilaterally, no dysmetria, no tremor Reflexes: Symmetric upper and lower plantar responses were flexor bilaterally. Gait and Station: Rising up from seated  position without assistance, normal stance,  moderate stride, good arm swing, smooth turning, able to perform tiptoe, and heel walking without difficulty. Tandem gait is steady.  No assistive device  DIAGNOSTIC DATA (LABS, IMAGING, TESTING) - I reviewed patient records, labs, notes, testing and imaging myself where available.  Lab Results  Component Value Date   WBC 12.9 (H) 08/07/2016   HGB 16.2 08/07/2016   HCT 47.4 08/07/2016   MCV 89.9 08/07/2016   PLT 215 08/07/2016      Component Value Date/Time   NA 137 02/26/2018 1419   K 4.4 02/26/2018 1419   CL 96 02/26/2018 1419   CO2 25 02/26/2018 1419   GLUCOSE 86 02/26/2018 1419   GLUCOSE 95 08/07/2016 1403   BUN 12 02/26/2018 1419   CREATININE 1.62 (H) 02/26/2018 1419   CREATININE 1.61 (H) 08/07/2016 1403   CALCIUM 9.6 02/26/2018 1419   PROT 7.8 02/23/2018 1106   ALBUMIN 4.5 02/23/2018 1106   AST 31 02/23/2018 1106   ALT 20 02/23/2018 1106   ALT 42 08/07/2016 1403   ALKPHOS 56 02/23/2018 1106   BILITOT 0.7 02/23/2018 1106   GFRNONAA 43 (L) 02/26/2018 1419   GFRNONAA 44 (L) 08/07/2016 1403   GFRAA 50 (L) 02/26/2018 1419   GFRAA 50 (L) 08/07/2016 1403   Lab Results  Component Value Date   CHOL 140 01/16/2017   HDL 55 01/16/2017   LDLCALC 75 01/16/2017   TRIG 52 01/16/2017   CHOLHDL 2.5 01/16/2017   Lab Results  Component Value Date   HGBA1C 5.9 (H) 02/23/2018   Lab Results  Component Value Date   VITAMINB12 378 07/21/2009   Lab Results  Component Value Date   TSH 1.129 07/21/2009      ASSESSMENT AND PLAN ZAID TOMES is a 68 y.o. male with PMH of HepC and Seizure disorder follows up for seizure evaluation. Patient and his brother are both poor historian, not sure about when seizure started, when Dilantin was started, and how often of seizure. Apparently, he had seizure in 04/2013 and in 08/2015, during both seizures, he lost consciousness and fell.  He has  had 3 seizures in the last year.   Changed to vimpat  100mg  bid dosing. Had EEG normal and MRI unremarkable.  He is not driving as  he does not have a car.The patient is a current patient of Dr. [name]  who no longer works here. This note is sent to the work in doctor.     Plan: Continue vimpat for seizure control.  Do not run out of medication, will refill  Patient  assistance needs to be updated yearly call [phone] pt is not driving  Call for further seizure activity  Follow-up yearly and as needed   Dennie Bible, [facility], [facility], APRN  [facility] [address], [place], [place] [address] [phone]

## 2018-04-13 ENCOUNTER — Ambulatory Visit: Payer: PPO | Admitting: Nurse Practitioner

## 2018-04-13 ENCOUNTER — Encounter: Payer: Self-pay | Admitting: Nurse Practitioner

## 2018-04-13 VITALS — BP 144/86 | HR 60 | Ht 68.0 in | Wt 159.2 lb

## 2018-04-13 DIAGNOSIS — G40909 Epilepsy, unspecified, not intractable, without status epilepticus: Secondary | ICD-10-CM | POA: Diagnosis not present

## 2018-04-13 DIAGNOSIS — R569 Unspecified convulsions: Secondary | ICD-10-CM | POA: Diagnosis not present

## 2018-04-13 MED ORDER — LACOSAMIDE 100 MG PO TABS: ORAL_TABLET | ORAL | 1 refills | Status: DC

## 2018-04-13 NOTE — Patient Instructions (Signed)
Continue vimpat for seizure control.  Do not run out of medication Patient patient assistance needs to be updated yearly call [phone] pt is not driving  Call for seizure activity  Follow-up yearly and as needed

## 2018-04-14 ENCOUNTER — Telehealth: Payer: Self-pay | Admitting: Nurse Practitioner

## 2018-04-14 DIAGNOSIS — R569 Unspecified convulsions: Secondary | ICD-10-CM

## 2018-04-14 NOTE — Telephone Encounter (Signed)
Patient's brother called and stated that they could not do the patient assistance and want [place] to do it. I asked if he had the number for the PAP to Vimpat, he stated that he did. He is going to call them and call me back.

## 2018-04-16 ENCOUNTER — Ambulatory Visit (INDEPENDENT_AMBULATORY_CARE_PROVIDER_SITE_OTHER): Payer: PPO | Admitting: Family Medicine

## 2018-04-16 DIAGNOSIS — I10 Essential (primary) hypertension: Secondary | ICD-10-CM

## 2018-04-16 LAB — BASIC METABOLIC PANEL
BUN/Creatinine Ratio: 11 (ref 10–24)
BUN: 13 mg/dL (ref 8–27)
CO2: 20 mmol/L (ref 20–29)
Calcium: 9.2 mg/dL (ref 8.6–10.2)
Chloride: 98 mmol/L (ref 96–106)
Creatinine, Ser: 1.21 mg/dL (ref 0.76–1.27)
GFR calc Af Amer: 71 mL/min/{1.73_m2} (ref >59)
GFR calc non Af Amer: 61 mL/min/{1.73_m2} (ref >59)
Glucose: 125 mg/dL — ABNORMAL HIGH (ref 65–99)
Potassium: 3.9 mmol/L (ref 3.5–5.2)
Sodium: 135 mmol/L (ref 134–144)

## 2018-04-18 NOTE — Progress Notes (Signed)
Lab only visit 

## 2018-04-21 ENCOUNTER — Telehealth: Payer: Self-pay | Admitting: Nurse Practitioner

## 2018-04-21 MED ORDER — VIMPAT 100 MG PO TABS: ORAL_TABLET | ORAL | 1 refills | Status: DC

## 2018-04-21 NOTE — Telephone Encounter (Signed)
Pt's brother called back. He has called PAP and was advised the script they rec'd was not signed. Please resend.  He also said the pt has one day of medication left. Please call to advise. Thank you

## 2018-04-21 NOTE — Telephone Encounter (Signed)
Done. Fax confirmation received [phone].   PAP Vimpat.

## 2018-04-21 NOTE — Telephone Encounter (Signed)
Jeffrey Serrano, please print a new script and have it signed and fax it over to [phone].

## 2018-04-21 NOTE — Telephone Encounter (Addendum)
error 

## 2018-04-22 ENCOUNTER — Other Ambulatory Visit: Payer: Self-pay | Admitting: *Deleted

## 2018-04-22 ENCOUNTER — Telehealth: Payer: Self-pay | Admitting: Nurse Practitioner

## 2018-04-22 DIAGNOSIS — R569 Unspecified convulsions: Secondary | ICD-10-CM

## 2018-04-22 MED ORDER — VIMPAT 100 MG PO TABS: ORAL_TABLET | ORAL | 1 refills | Status: DC

## 2018-04-22 NOTE — Telephone Encounter (Signed)
Prescription reprinted, sent to [facility] Dr. Jannifer Franklin, will refax once signed.

## 2018-04-22 NOTE — Telephone Encounter (Signed)
Fax confirmation received [phone]. PAP.

## 2018-04-22 NOTE — Telephone Encounter (Signed)
Sent to [facility], Dr.  Jannifer Franklin for signature.

## 2018-04-22 NOTE — Telephone Encounter (Signed)
Manny from  [facility] calling because they can not accept the Vimpat prescription refill sent to them yesterday signed by Hoyle Sauer. In [place] the NP can not sign for controlled medication. Please resend by the physician or can call with a verbal. Pharmacy number is [phone]

## 2018-04-24 ENCOUNTER — Telehealth: Payer: Self-pay | Admitting: Nurse Practitioner

## 2018-04-24 NOTE — Telephone Encounter (Signed)
Manny from [facility] called needing a verbal RX for the pts VIMPAT 100 MG TABS. Please advise.

## 2018-04-24 NOTE — Telephone Encounter (Signed)
Spoke to [place], brother of pt.  [facility] will get medication to pt on Monday.   They do not have any for W/E.  I will give sample to them (VIMPAT 100mg  1 po BID box of 14 tabs.  [place] [phone].  LOT [id number] exp 12/2021.  He will call my mobile when arrives since not open now.

## 2018-04-24 NOTE — Telephone Encounter (Signed)
error 

## 2018-04-24 NOTE — Telephone Encounter (Signed)
I gave samples to [facility] and pt.  They verbalized understanding of instructions.

## 2018-05-18 ENCOUNTER — Encounter: Payer: Self-pay | Admitting: Podiatry

## 2018-05-18 ENCOUNTER — Ambulatory Visit: Payer: PPO | Admitting: Podiatry

## 2018-05-18 DIAGNOSIS — L84 Corns and callosities: Secondary | ICD-10-CM | POA: Diagnosis not present

## 2018-05-18 DIAGNOSIS — I739 Peripheral vascular disease, unspecified: Secondary | ICD-10-CM

## 2018-05-18 DIAGNOSIS — M79674 Pain in right toe(s): Secondary | ICD-10-CM | POA: Diagnosis not present

## 2018-05-18 DIAGNOSIS — M79675 Pain in left toe(s): Secondary | ICD-10-CM | POA: Diagnosis not present

## 2018-05-18 DIAGNOSIS — B351 Tinea unguium: Secondary | ICD-10-CM

## 2018-05-18 NOTE — Patient Instructions (Signed)

## 2018-05-24 NOTE — Progress Notes (Signed)
Subjective: Jeffrey Serrano is a 68 y.o. y.o. male who presents for preventative foot care today with PAD and cc of painful, discolored, thick toenails and painful callus which interfere with daily activities. Pain is aggravated when wearing enclosed shoe gear. Pain is relieved with periodic professional debridement.  Wendie Agreste, MD is his PCP and last visit was 04/16/2018.   Current Outpatient Medications:  .  Cholecalciferol (VITAMIN D3) 1000 units CAPS, Take by mouth., Disp: , Rfl:  .  gabapentin (NEURONTIN) 100 MG capsule, TAKE 1 CAPSULE BY MOUTH ONCE DAILY IF NEEDED, Disp: 90 capsule, Rfl: 0 .  ibuprofen (ADVIL,MOTRIN) 200 MG tablet, Take 200 mg by mouth every 6 (six) hours as needed., Disp: , Rfl:  .  lisinopril-hydrochlorothiazide (PRINZIDE,ZESTORETIC) 10-12.5 MG tablet, Take 1 tablet by mouth daily., Disp: 90 tablet, Rfl: 1 .  triamcinolone cream (KENALOG) 0.1 %, Apply 1 application topically 2 (two) times daily., Disp: 30 g, Rfl: 0 .  VIMPAT 100 MG TABS, Take 1 tablet by mouth twice a day as directed by physician., Disp: 180 tablet, Rfl: 1  No Known Allergies  Objective: Vascular Examination: Capillary refill time delayed  x 10 digits.  Dorsalis pedis pulses absent b/l.  Posterior tibial pulses absent b/l.  No digital hair x 10 digits.  Skin temperature gradient WNL b/l.  Dermatological Examination: Skin thin, shiny and atrophic b/l.  Toenails 1-5 b/l discolored, thick, dystrophic with subungual debris and pain with palpation to nailbeds due to thickness of nails.  Subungual seroma noted right 2nd digit.   Hyperkeratotic lesion noted submetatarsal head 5 right foot. No erythema, no edema, no drainage, no flocculence b/l.  Musculoskeletal: Muscle strength 5/5 to all LE muscle groups  Neurological: Sensation intact with 10 gram monofilament.  Vibratory sensation intact.   Assessment: 1. Painful onychomycosis toenails 1-5 b/l 2. Callus submet head 5 right  foot 3. Subungual seroma right 2nd digit 4. Peripheral arterial disease  Plan: 1. Toenails 1-5 b/l were debrided in length and girth without iatrogenic bleeding.Patient to continue soft, supportive shoe gear. Seroma evacuated right 2nd digit and cleansed with alcohol. Patient instructed to apply triple antibiotic ointment to digit once daily for one week. 2. Hyperkeratotic lesion(s) pared with sterile chisel blade and gently smoothed with burr submet head 5 right foot. 3. Patient to report any pedal injuries to medical professional  4. Follow up 3 months.  5. Patient/POA to call should there be a concern in the interim.

## 2018-05-29 ENCOUNTER — Other Ambulatory Visit: Payer: Self-pay | Admitting: Family Medicine

## 2018-05-29 DIAGNOSIS — M792 Neuralgia and neuritis, unspecified: Secondary | ICD-10-CM

## 2018-05-29 NOTE — Telephone Encounter (Signed)
Requested Prescriptions  Pending Prescriptions Disp Refills  . gabapentin (NEURONTIN) 100 MG capsule [Pharmacy Med Name: GABAPENTIN 100 MG CAPSULE] 90 capsule 0    Sig: TAKE 1 CAPSULE BY MOUTH ONCE DAILY IF NEEDED     Neurology: Anticonvulsants - gabapentin Passed - 05/29/2018 11:08 AM      Passed - Valid encounter within last 12 months    Recent Outpatient Visits          1 month ago Essential hypertension   [facility], Ranell Patrick, MD   2 months ago Essential hypertension   [facility], Ranell Patrick, MD   3 months ago Low bicarbonate   [facility], Ranell Patrick, MD   3 months ago Medicare annual wellness visit, subsequent   [facility], Ranell Patrick, MD   9 months ago Hyperkalemia   [facility], Ranell Patrick, MD      Future Appointments            In 1 month Carlota Raspberry Ranell Patrick, MD [facility] [place], [facility]

## 2018-06-17 ENCOUNTER — Other Ambulatory Visit: Payer: Self-pay | Admitting: Family Medicine

## 2018-06-17 DIAGNOSIS — I10 Essential (primary) hypertension: Secondary | ICD-10-CM

## 2018-06-17 NOTE — Telephone Encounter (Signed)
Product backordered. Routing back to [place] to review.

## 2018-06-18 MED ORDER — HYDROCHLOROTHIAZIDE 12.5 MG PO TABS: 12.5000 mg | ORAL_TABLET | Freq: Every day | ORAL | 0 refills | Status: DC

## 2018-06-18 NOTE — Telephone Encounter (Signed)
Lisinopril and HCTZ split until off backorder.

## 2018-06-19 ENCOUNTER — Other Ambulatory Visit: Payer: Self-pay

## 2018-06-19 ENCOUNTER — Encounter (HOSPITAL_COMMUNITY): Payer: Self-pay

## 2018-06-19 ENCOUNTER — Emergency Department (HOSPITAL_COMMUNITY)
Admission: EM | Admit: 2018-06-19 | Discharge: 2018-06-19 | Disposition: A | Discharge status: Home / Self Care | Payer: PPO | Attending: Emergency Medicine | Admitting: Emergency Medicine

## 2018-06-19 DIAGNOSIS — Z87891 Personal history of nicotine dependence: Secondary | ICD-10-CM | POA: Insufficient documentation

## 2018-06-19 DIAGNOSIS — Z79899 Other long term (current) drug therapy: Secondary | ICD-10-CM | POA: Diagnosis not present

## 2018-06-19 DIAGNOSIS — Z96652 Presence of left artificial knee joint: Secondary | ICD-10-CM | POA: Diagnosis not present

## 2018-06-19 DIAGNOSIS — M25511 Pain in right shoulder: Secondary | ICD-10-CM | POA: Diagnosis not present

## 2018-06-19 DIAGNOSIS — M25512 Pain in left shoulder: Secondary | ICD-10-CM | POA: Diagnosis not present

## 2018-06-19 DIAGNOSIS — I10 Essential (primary) hypertension: Secondary | ICD-10-CM | POA: Insufficient documentation

## 2018-06-19 DIAGNOSIS — Y92410 Unspecified street and highway as the place of occurrence of the external cause: Secondary | ICD-10-CM | POA: Diagnosis not present

## 2018-06-19 DIAGNOSIS — S39012A Strain of muscle, fascia and tendon of lower back, initial encounter: Secondary | ICD-10-CM | POA: Diagnosis not present

## 2018-06-19 DIAGNOSIS — Y999 Unspecified external cause status: Secondary | ICD-10-CM | POA: Insufficient documentation

## 2018-06-19 DIAGNOSIS — T148XXA Other injury of unspecified body region, initial encounter: Secondary | ICD-10-CM | POA: Diagnosis not present

## 2018-06-19 DIAGNOSIS — S161XXA Strain of muscle, fascia and tendon at neck level, initial encounter: Secondary | ICD-10-CM | POA: Diagnosis not present

## 2018-06-19 DIAGNOSIS — I480 Paroxysmal atrial fibrillation: Secondary | ICD-10-CM | POA: Diagnosis not present

## 2018-06-19 DIAGNOSIS — S199XXA Unspecified injury of neck, initial encounter: Secondary | ICD-10-CM | POA: Diagnosis present

## 2018-06-19 DIAGNOSIS — Y939 Activity, unspecified: Secondary | ICD-10-CM | POA: Insufficient documentation

## 2018-06-19 DIAGNOSIS — E890 Postprocedural hypothyroidism: Secondary | ICD-10-CM | POA: Diagnosis not present

## 2018-06-19 MED ORDER — HYDROCODONE-ACETAMINOPHEN 5-325 MG PO TABS
1.0000 | ORAL_TABLET | Freq: Once | ORAL | Status: AC
  Administered 2018-06-19: 1 via ORAL
  Filled 2018-06-19: qty 1

## 2018-06-19 MED ORDER — HYDROCODONE-ACETAMINOPHEN 5-325 MG PO TABS: 1.0000 | ORAL_TABLET | ORAL | 0 refills | Status: DC | PRN
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
## 2018-06-19 NOTE — Discharge Instructions (Signed)
Use ice on sore spots 3-4 times a day for 30 minutes.  Do this for 2 days, then change to heat.  Do not drive or work when using the narcotic pain reliever.  Try using Motrin or Tylenol for pain.  Use the stronger pain medicine only if needed.  See your doctor for blood pressure check in a week or 2.
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
## 2018-06-23 ENCOUNTER — Other Ambulatory Visit: Payer: Self-pay

## 2018-06-23 DIAGNOSIS — Z0000 Encounter for general adult medical examination without abnormal findings: Secondary | ICD-10-CM

## 2018-06-23 DIAGNOSIS — R739 Hyperglycemia, unspecified: Secondary | ICD-10-CM

## 2018-06-23 DIAGNOSIS — I10 Essential (primary) hypertension: Secondary | ICD-10-CM

## 2018-06-25 ENCOUNTER — Ambulatory Visit: Payer: PPO | Admitting: Family Medicine

## 2018-06-26 ENCOUNTER — Other Ambulatory Visit: Payer: Self-pay

## 2018-06-26 ENCOUNTER — Ambulatory Visit (INDEPENDENT_AMBULATORY_CARE_PROVIDER_SITE_OTHER): Payer: PPO | Admitting: Family Medicine

## 2018-06-26 DIAGNOSIS — I471 Supraventricular tachycardia: Secondary | ICD-10-CM | POA: Diagnosis not present

## 2018-06-26 DIAGNOSIS — I2119 ST elevation (STEMI) myocardial infarction involving other coronary artery of inferior wall: Secondary | ICD-10-CM | POA: Diagnosis not present

## 2018-06-26 DIAGNOSIS — Z170 Estrogen receptor positive status [ER+]: Secondary | ICD-10-CM | POA: Diagnosis not present

## 2018-06-26 DIAGNOSIS — I10 Essential (primary) hypertension: Secondary | ICD-10-CM | POA: Diagnosis not present

## 2018-06-26 DIAGNOSIS — F419 Anxiety disorder, unspecified: Secondary | ICD-10-CM | POA: Diagnosis not present

## 2018-06-26 DIAGNOSIS — R739 Hyperglycemia, unspecified: Secondary | ICD-10-CM | POA: Diagnosis not present

## 2018-06-26 DIAGNOSIS — M4722 Other spondylosis with radiculopathy, cervical region: Secondary | ICD-10-CM | POA: Diagnosis not present

## 2018-06-26 DIAGNOSIS — C50512 Malignant neoplasm of lower-outer quadrant of left female breast: Secondary | ICD-10-CM | POA: Diagnosis not present

## 2018-06-26 DIAGNOSIS — Z01818 Encounter for other preprocedural examination: Secondary | ICD-10-CM | POA: Diagnosis not present

## 2018-06-26 DIAGNOSIS — F4321 Adjustment disorder with depressed mood: Secondary | ICD-10-CM | POA: Diagnosis not present

## 2018-06-27 LAB — COMPREHENSIVE METABOLIC PANEL
ALT: 12 IU/L (ref 0–44)
AST: 23 IU/L (ref 0–40)
Albumin/Globulin Ratio: 1.4 (ref 1.2–2.2)
Albumin: 4.1 g/dL (ref 3.8–4.8)
Alkaline Phosphatase: 60 IU/L (ref 39–117)
BUN/Creatinine Ratio: 9 — ABNORMAL LOW (ref 10–24)
BUN: 10 mg/dL (ref 8–27)
Bilirubin Total: 0.6 mg/dL (ref 0.0–1.2)
CO2: 19 mmol/L — ABNORMAL LOW (ref 20–29)
Calcium: 9.5 mg/dL (ref 8.6–10.2)
Chloride: 99 mmol/L (ref 96–106)
Creatinine, Ser: 1.17 mg/dL (ref 0.76–1.27)
GFR calc Af Amer: 73 mL/min/{1.73_m2} (ref >59)
GFR calc non Af Amer: 63 mL/min/{1.73_m2} (ref >59)
Globulin, Total: 3 g/dL (ref 1.5–4.5)
Glucose: 93 mg/dL (ref 65–99)
Potassium: 4.1 mmol/L (ref 3.5–5.2)
Sodium: 136 mmol/L (ref 134–144)
Total Protein: 7.1 g/dL (ref 6.0–8.5)

## 2018-06-27 LAB — LIPID PANEL
Chol/HDL Ratio: 3.3 ratio (ref 0.0–5.0)
Cholesterol, Total: 152 mg/dL (ref 100–199)
HDL: 46 mg/dL (ref >39)
LDL Calculated: 94 mg/dL (ref 0–99)
Triglycerides: 59 mg/dL (ref 0–149)
VLDL Cholesterol Cal: 12 mg/dL (ref 5–40)

## 2018-06-27 LAB — HEMOGLOBIN A1C
Est. average glucose Bld gHb Est-mCnc: 126 mg/dL
Hgb A1c MFr Bld: 6 % — ABNORMAL HIGH (ref 4.8–5.6)

## 2018-06-29 ENCOUNTER — Other Ambulatory Visit: Payer: Self-pay

## 2018-06-29 ENCOUNTER — Telehealth (INDEPENDENT_AMBULATORY_CARE_PROVIDER_SITE_OTHER): Payer: PPO | Admitting: Family Medicine

## 2018-06-29 DIAGNOSIS — M549 Dorsalgia, unspecified: Secondary | ICD-10-CM

## 2018-06-29 DIAGNOSIS — I10 Essential (primary) hypertension: Secondary | ICD-10-CM

## 2018-06-29 MED ORDER — HYDROCODONE-ACETAMINOPHEN 5-325 MG PO TABS: 1.0000 | ORAL_TABLET | Freq: Four times a day (QID) | ORAL | 0 refills | Status: DC | PRN
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
## 2018-07-03 ENCOUNTER — Telehealth: Payer: Self-pay | Admitting: Emergency Medicine

## 2018-07-04 NOTE — Telephone Encounter (Signed)
There is significant variability in  home readings: 06-29-18 bp 172/168 am then 108/97 then 06/30/18 173/95 on 07/01/18 160/95 on 07/02/18 144/92 then 04/03/2018 135/101   I am concerned whether or not he is getting accurate readings from machine at home given the significant variability.  It may be best to schedule an office visit with his blood pressure machine in the next 1 to 2 weeks(sooner if any new symptoms such as headaches, or the emergency room if acute worsening symptoms). Make sure he is sitting down for at least 10 to 15 minutes before checking blood pressure, should also not have a full bladder, and should be at a time when pain is controlled.  Let me know if there are questions in the meantime.

## 2018-07-06 NOTE — Telephone Encounter (Signed)
Appointment has been scheduled for pt to have an OV.

## 2018-07-20 ENCOUNTER — Other Ambulatory Visit: Payer: Self-pay

## 2018-07-20 ENCOUNTER — Telehealth (INDEPENDENT_AMBULATORY_CARE_PROVIDER_SITE_OTHER): Payer: PPO | Admitting: Family Medicine

## 2018-07-20 DIAGNOSIS — M549 Dorsalgia, unspecified: Secondary | ICD-10-CM

## 2018-07-20 DIAGNOSIS — I10 Essential (primary) hypertension: Secondary | ICD-10-CM
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
## 2018-07-20 NOTE — Patient Instructions (Signed)
° ° ° °  If you have lab work done today you will be contacted with your lab results within the next 2 weeks.  If you have not heard from us then please contact us. The fastest way to get your results is to register for My Chart. ° ° °IF you received an x-ray today, you will receive an invoice from [facility]. Please contact [facility] at [phone] with questions or concerns regarding your invoice.  ° °IF you received labwork today, you will receive an invoice from [facility]. Please contact [facility] at [phone] with questions or concerns regarding your invoice.  ° °Our billing staff will not be able to assist you with questions regarding bills from these companies. ° °You will be contacted with the lab results as soon as they are available. The fastest way to get your results is to activate your My Chart account. Instructions are located on the last page of this paperwork. If you have not heard from us regarding the results in 2 weeks, please contact this office. °  ° ° ° °

## 2018-08-03 ENCOUNTER — Other Ambulatory Visit: Payer: Self-pay

## 2018-08-03 ENCOUNTER — Telehealth: Payer: PPO | Admitting: Family Medicine

## 2018-08-03 DIAGNOSIS — I10 Essential (primary) hypertension: Secondary | ICD-10-CM

## 2018-08-03 DIAGNOSIS — M549 Dorsalgia, unspecified: Secondary | ICD-10-CM
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
## 2018-08-03 NOTE — Patient Instructions (Signed)
° ° ° °  If you have lab work done today you will be contacted with your lab results within the next 2 weeks.  If you have not heard from us then please contact us. The fastest way to get your results is to register for My Chart. ° ° °IF you received an x-ray today, you will receive an invoice from [facility]. Please contact [facility] at [phone] with questions or concerns regarding your invoice.  ° °IF you received labwork today, you will receive an invoice from [facility]. Please contact [facility] at [phone] with questions or concerns regarding your invoice.  ° °Our billing staff will not be able to assist you with questions regarding bills from these companies. ° °You will be contacted with the lab results as soon as they are available. The fastest way to get your results is to activate your My Chart account. Instructions are located on the last page of this paperwork. If you have not heard from us regarding the results in 2 weeks, please contact this office. °  ° ° ° °

## 2018-08-03 NOTE — Progress Notes (Signed)
CC: Back pain x 1 1/2 months, pain level 7/10 and pain is sharp, and f/u htn. No refills needed at this time on medications.  No travel outside the [place] or [place] in the past 3 weeks.  No recent weight or bp taken.

## 2018-08-04 ENCOUNTER — Other Ambulatory Visit: Payer: Self-pay

## 2018-08-04 ENCOUNTER — Encounter: Payer: Self-pay | Admitting: Family Medicine

## 2018-08-04 ENCOUNTER — Ambulatory Visit (INDEPENDENT_AMBULATORY_CARE_PROVIDER_SITE_OTHER): Payer: PPO | Admitting: Family Medicine

## 2018-08-04 VITALS — BP 127/70 | HR 72 | Temp 98.4°F | Resp 18 | Ht 68.0 in | Wt 155.0 lb

## 2018-08-04 DIAGNOSIS — M25512 Pain in left shoulder: Secondary | ICD-10-CM | POA: Diagnosis not present

## 2018-08-04 DIAGNOSIS — M542 Cervicalgia: Secondary | ICD-10-CM | POA: Diagnosis not present

## 2018-08-04 DIAGNOSIS — M549 Dorsalgia, unspecified: Secondary | ICD-10-CM

## 2018-08-04 DIAGNOSIS — M25612 Stiffness of left shoulder, not elsewhere classified: Secondary | ICD-10-CM

## 2018-08-04 NOTE — Patient Instructions (Addendum)
I will order some x-rays of your neck, upper back, and shoulder. depending on results, may be referring you to physical therapy.  Tylenol up to 4 times per day if needed.  Continue heat and gentle range of motion as that seems to be helping some.  For left shoulder pain I am also checking an x-ray but will refer you to orthopedics to evaluate that area further.  See info below on how to check blood pressure. If still abnormal readings, bring the meter in here to make sure it is reading accurately (blood pressure looks normal in the office today).    How to Take Your Blood Pressure You can take your blood pressure at home with a machine. You may need to check your blood pressure at home:  To check if you have high blood pressure (hypertension).  To check your blood pressure over time.  To make sure your blood pressure medicine is working. Supplies needed: You will need a blood pressure machine, or monitor. You can buy one at a drugstore or online. When choosing one:  Choose one with an arm cuff.  Choose one that wraps around your upper arm. Only one finger should fit between your arm and the cuff.  Do not choose one that measures your blood pressure from your wrist or finger. Your doctor can suggest a monitor. How to prepare Avoid these things for 30 minutes before checking your blood pressure:  Drinking caffeine.  Drinking alcohol.  Eating.  Smoking.  Exercising. Five minutes before checking your blood pressure:  Pee.  Sit in a dining chair. Avoid sitting in a soft couch or armchair.  Be quiet. Do not talk. How to take your blood pressure Follow the instructions that came with your machine. If you have a digital blood pressure monitor, these may be the instructions: 1. Sit up straight. 2. Place your feet on the floor. Do not cross your ankles or legs. 3. Rest your left arm at the level of your heart. You may rest it on a table, desk, or chair. 4. Pull up your shirt  sleeve. 5. Wrap the blood pressure cuff around the upper part of your left arm. The cuff should be 1 inch (2.5 cm) above your elbow. It is best to wrap the cuff around bare skin. 6. Fit the cuff snugly around your arm. You should be able to place only one finger between the cuff and your arm. 7. Put the cord inside the groove of your elbow. 8. Press the power button. 9. Sit quietly while the cuff fills with air and loses air. 10. Write down the numbers on the screen. 11. Wait 2-3 minutes and then repeat steps 1-10. What do the numbers mean? Two numbers make up your blood pressure. The first number is called systolic pressure. The second is called diastolic pressure. An example of a blood pressure reading is "120 over 80" (or 120/80). If you are an adult and do not have a medical condition, use this guide to find out if your blood pressure is normal: Normal  First number: below 120.  Second number: below 80. Elevated  First number: 120-129.  Second number: below 80. Hypertension stage 1  First number: 130-139.  Second number: 80-89. Hypertension stage 2  First number: 140 or above.  Second number: 43 or above. Your blood pressure is above normal even if only the top or bottom number is above normal. Follow these instructions at home:  Check your blood pressure as often  as your doctor tells you to.  Take your monitor to your next doctor's appointment. Your doctor will: ? Make sure you are using it correctly. ? Make sure it is working right.  Make sure you understand what your blood pressure numbers should be.  Tell your doctor if your medicines are causing side effects. Contact a doctor if:  Your blood pressure keeps being high. Get help right away if:  Your first blood pressure number is higher than 180.  Your second blood pressure number is higher than 120. This information is not intended to replace advice given to you by your health care provider. Make sure you  discuss any questions you have with your health care provider. Document Released: 02/15/2008 Document Revised: 01/31/2016 Document Reviewed: 08/11/2015 Elsevier Interactive Patient Education  [employer].    If you have lab work done today you will be contacted with your lab results within the next 2 weeks.  If you have not heard from [place] then please contact us. The fastest way to get your results is to register for My Chart.   IF you received an x-ray today, you will receive an invoice from [facility]. Please contact [facility] at [phone] with questions or concerns regarding your invoice.   IF you received labwork today, you will receive an invoice from [facility]. Please contact [facility] at [phone] with questions or concerns regarding your invoice.   Our billing staff will not be able to assist you with questions regarding bills from these companies.  You will be contacted with the lab results as soon as they are available. The fastest way to get your results is to activate your My Chart account. Instructions are located on the last page of this paperwork. If you have not heard from [place] regarding the results in 2 weeks, please contact this office.
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
## 2018-08-06 ENCOUNTER — Encounter: Payer: Self-pay | Admitting: Family Medicine

## 2018-08-14 ENCOUNTER — Ambulatory Visit: Payer: PPO | Admitting: Orthopaedic Surgery

## 2018-08-18 ENCOUNTER — Ambulatory Visit (INDEPENDENT_AMBULATORY_CARE_PROVIDER_SITE_OTHER): Payer: PPO | Admitting: Podiatry

## 2018-08-18 ENCOUNTER — Encounter: Payer: Self-pay | Admitting: Podiatry

## 2018-08-18 ENCOUNTER — Other Ambulatory Visit: Payer: Self-pay

## 2018-08-18 ENCOUNTER — Ambulatory Visit (INDEPENDENT_AMBULATORY_CARE_PROVIDER_SITE_OTHER): Payer: PPO | Admitting: Orthopaedic Surgery

## 2018-08-18 ENCOUNTER — Ambulatory Visit (INDEPENDENT_AMBULATORY_CARE_PROVIDER_SITE_OTHER): Payer: PPO

## 2018-08-18 ENCOUNTER — Encounter: Payer: Self-pay | Admitting: Orthopaedic Surgery

## 2018-08-18 VITALS — Temp 97.9°F

## 2018-08-18 DIAGNOSIS — M79674 Pain in right toe(s): Secondary | ICD-10-CM | POA: Diagnosis not present

## 2018-08-18 DIAGNOSIS — L84 Corns and callosities: Secondary | ICD-10-CM | POA: Diagnosis not present

## 2018-08-18 DIAGNOSIS — G8929 Other chronic pain: Secondary | ICD-10-CM

## 2018-08-18 DIAGNOSIS — B351 Tinea unguium: Secondary | ICD-10-CM

## 2018-08-18 DIAGNOSIS — M25512 Pain in left shoulder: Secondary | ICD-10-CM | POA: Diagnosis not present

## 2018-08-18 DIAGNOSIS — M79675 Pain in left toe(s): Secondary | ICD-10-CM

## 2018-08-18 DIAGNOSIS — I739 Peripheral vascular disease, unspecified: Secondary | ICD-10-CM

## 2018-08-18 NOTE — Progress Notes (Signed)
Office Visit Note   Patient: Jeffrey Serrano           Date of Birth: 07/03/1950           MRN: [id number] Visit Date: 08/18/2018              Requested by: Wendie Agreste, MD [address] [place], [place] [address] PCP: Wendie Agreste, MD   Assessment & Plan: Visit Diagnoses:  1. Chronic left shoulder pain     Plan: Impression is acute on chronic rotator cuff deficiency suspect some degree of rotator cuff arthropathy that was unknown to the patient.  We will need to obtain an MRI to fully assess the pathology and determine whether this will need surgery.  We will have the patient follow-up after the MRI.  Follow-Up Instructions: Return in about 10 days (around 08/28/2018).   Orders:  Orders Placed This Encounter  Procedures  . XR Shoulder Left  . MR Shoulder Left w/o contrast   No orders of the defined types were placed in this encounter.     Procedures: No procedures performed   Clinical Data: No additional findings.   Subjective: Chief Complaint  Patient presents with  . Left Shoulder - Pain    Patient is a 68 year old gentleman comes in for evaluation of left shoulder pain for the last several months.  He did have a fall prior to the start of the left shoulder pain.  Since then he has had difficulty with raising his arm and with lifting objects.  He is unable to lift his arm completely up without shrugging.  He denies any radicular symptoms.   Review of Systems  Constitutional: Negative.   All other systems reviewed and are negative.    Objective: Vital Signs: There were no vitals taken for this visit.  Physical Exam Vitals signs and nursing note reviewed.  Constitutional:      Appearance: He is well-developed.  HENT:     Head: Normocephalic and atraumatic.  Eyes:     Pupils: Pupils are equal, round, and reactive to light.  Neck:     Musculoskeletal: Neck supple.  Pulmonary:     Effort: Pulmonary effort is normal.  Abdominal:   Palpations: Abdomen is soft.  Musculoskeletal: Normal range of motion.  Skin:    General: Skin is warm.  Neurological:     Mental Status: He is alert and oriented to person, place, and time.  Psychiatric:        Behavior: Behavior normal.        Thought Content: Thought content normal.        Judgment: Judgment normal.     Ortho Exam Left shoulder exam shows decreased strength with manual muscle testing of supraspinatus and infraspinatus.  He is having to compensate by shrugging in order to abduct his arm.  He has generalized pain with Werner Lean, [place], [place] impingement. Specialty Comments:  No specialty comments available.  Imaging: Xr Shoulder Left  Result Date: 08/18/2018 Mild to moderate DJD of glenohumeral joint.  No acute abnormalities.    PMFS History: Patient Active Problem List   Diagnosis Date Noted  . Chronic hepatitis C without hepatic coma ([place]) 08/07/2016  . Seizure disorder ([place]) 05/22/2016  . DEPRESSION 09/10/2006  . Essential hypertension 09/10/2006  . PERIPHERAL VASCULAR DISEASE 09/10/2006  . Seizures ([place]) 09/10/2006   Past Medical History:  Diagnosis Date  . Hypertension   . PVD (peripheral vascular disease) ([place])   . Seizures ([place])  most recent Jan 2020    Family History  Problem Relation Age of Onset  . Seizures Father     History reviewed. No pertinent surgical history. Social History   Occupational History    Comment: Arboriculturist  Tobacco Use  . Smoking status: Former Smoker    Last attempt to quit: 03/26/1988    Years since quitting: 30.4  . Smokeless tobacco: Never Used  Substance and Sexual Activity  . Alcohol use: No  . Drug use: No  . Sexual activity: Not on file

## 2018-08-18 NOTE — Patient Instructions (Signed)

## 2018-08-22 ENCOUNTER — Encounter: Payer: Self-pay | Admitting: Podiatry

## 2018-08-22 NOTE — Progress Notes (Signed)
Subjective: Jeffrey Serrano presents today with painful, thick toenails 1-5 b/l that he cannot cut and which interfere with daily activities.  Pain is aggravated when wearing enclosed shoe gear.  Wendie Agreste, MD is his PCP and last visit was 06/29/2018.   Current Outpatient Medications:  .  Cholecalciferol (VITAMIN D3) 1000 units CAPS, Take by mouth., Disp: , Rfl:  .  gabapentin (NEURONTIN) 100 MG capsule, TAKE 1 CAPSULE BY MOUTH ONCE DAILY IF NEEDED, Disp: 90 capsule, Rfl: 0 .  hydrochlorothiazide (HYDRODIURIL) 12.5 MG tablet, Take 1 tablet (12.5 mg total) by mouth daily., Disp: 90 tablet, Rfl: 0 .  HYDROcodone-acetaminophen (NORCO/VICODIN) 5-325 MG tablet, Take 1 tablet by mouth every 6 (six) hours as needed for moderate pain., Disp: 10 tablet, Rfl: 0 .  ibuprofen (ADVIL,MOTRIN) 200 MG tablet, Take 200 mg by mouth every 6 (six) hours as needed., Disp: , Rfl:  .  lisinopril (PRINIVIL,ZESTRIL) 10 MG tablet, Take 1 tablet (10 mg total) by mouth daily., Disp: 90 tablet, Rfl: 0 .  triamcinolone cream (KENALOG) 0.1 %, Apply 1 application topically 2 (two) times daily., Disp: 30 g, Rfl: 0 .  VIMPAT 100 MG TABS, Take 1 tablet by mouth twice a day as directed by physician., Disp: 180 tablet, Rfl: 1  No Known Allergies  Objective: Vitals:   08/18/18 1349  Temp: 97.9 F (36.6 C)    Vascular Examination: Capillary refill time delayed x 10 digits.  Dorsalis pedis and Posterior tibial pulses absent b/l.  Digital hair absent x 10 digits.  Skin temperature gradient WNL b/l.  Dermatological Examination: Skin is thin, shiny and atrophic b/l.  Toenails 1-5 b/l discolored, thick, dystrophic with subungual debris and pain with palpation to nailbeds due to thickness of nails.  Hyperkeratotic lesion submet head 5 right foot with tenderness to palpation. No edema, no erythema, no drainage, no flocculence.  Dry, flaky skin b/l LE. No breaks in skin.   Musculoskeletal: Muscle strength 5/5 to  all LE muscle groups  No gross bony deformities b/l.  No pain, crepitus or joint limitation noted with ROM.   Neurological: Sensation intact with 10 gram monofilament.  Vibratory sensation intact.  Assessment: Painful onychomycosis toenails 1-5 b/l  Callus submet head 5 right foot Xerosis PAD  Plan: 1. Toenails 1-5 b/l were debrided in length and girth without iatrogenic bleeding. 2. Advised patient to apply moisturizer to both feet daily avoiding application between toes. 3. Calluses pared submetatarsal head(s) 5 right foot utilizing sterile scalpel blade without incident. 4. Patient to continue soft, supportive shoe gear daily. 5. Patient to report any pedal injuries to medical professional immediately. 6. Follow up 3 months.  7. Patient/POA to call should there be a concern in the interim.

## 2018-08-23 ENCOUNTER — Other Ambulatory Visit: Payer: Self-pay | Admitting: Family Medicine

## 2018-08-23 DIAGNOSIS — M792 Neuralgia and neuritis, unspecified: Secondary | ICD-10-CM

## 2018-08-27 ENCOUNTER — Ambulatory Visit: Payer: PRIVATE HEALTH INSURANCE | Admitting: Orthopaedic Surgery

## 2018-09-04 ENCOUNTER — Other Ambulatory Visit: Payer: Self-pay

## 2018-09-04 ENCOUNTER — Ambulatory Visit
Admission: RE | Admit: 2018-09-04 | Discharge: 2018-09-04 | Disposition: A | Discharge status: Home / Self Care | Payer: PRIVATE HEALTH INSURANCE | Source: Ambulatory Visit | Attending: Orthopaedic Surgery | Admitting: Orthopaedic Surgery

## 2018-09-04 DIAGNOSIS — G8929 Other chronic pain: Secondary | ICD-10-CM

## 2018-09-04 DIAGNOSIS — M25512 Pain in left shoulder: Secondary | ICD-10-CM

## 2018-09-07 ENCOUNTER — Other Ambulatory Visit: Payer: Self-pay | Admitting: Family Medicine

## 2018-09-07 DIAGNOSIS — I10 Essential (primary) hypertension: Secondary | ICD-10-CM

## 2018-09-08 ENCOUNTER — Encounter: Payer: Self-pay | Admitting: Orthopaedic Surgery

## 2018-09-08 ENCOUNTER — Telehealth: Payer: Self-pay

## 2018-09-08 ENCOUNTER — Ambulatory Visit (INDEPENDENT_AMBULATORY_CARE_PROVIDER_SITE_OTHER): Payer: PRIVATE HEALTH INSURANCE | Admitting: Orthopaedic Surgery

## 2018-09-08 ENCOUNTER — Other Ambulatory Visit: Payer: Self-pay

## 2018-09-08 DIAGNOSIS — M75122 Complete rotator cuff tear or rupture of left shoulder, not specified as traumatic: Secondary | ICD-10-CM

## 2018-09-08 NOTE — Telephone Encounter (Signed)
Patient was scheduled 09/14/2018 at 3 :15pm with Dr Marlou Sa

## 2018-09-08 NOTE — Progress Notes (Signed)
   Office Visit Note   Patient: Jeffrey Serrano           Date of Birth: 1950-04-21           MRN: [id number] Visit Date: 09/08/2018              Requested by: Wendie Agreste, MD [address] [place],  [place] [address] PCP: Wendie Agreste, MD   Assessment & Plan: Visit Diagnoses:  1. Complete tear of left rotator cuff, unspecified whether traumatic     Plan: Impression is complete tear left shoulder supraspinatus and infraspinatus tendons with retraction, atrophy and superior migration humeral head as well as associated moderate glenohumeral degenerative changes.  We will refer the patient to Dr. Marlou Sa for further evaluation and treatment recommendation to likely include a reverse total shoulder replacement.    Follow-Up Instructions: Return for f/u with Dr. Marlou Sa for surgical consultation.   Orders:  No orders of the defined types were placed in this encounter.  No orders of the defined types were placed in this encounter.     Procedures: No procedures performed   Clinical Data: No additional findings.   Subjective: Chief Complaint  Patient presents with  . Follow-up    HPI patient is a pleasant 68 year old gentleman who presents our clinic today to discuss MRI results of the left shoulder.  MRI of the left shoulder shows a full-thickness retracted tear of the supraspinatus and infraspinatus tendons with atrophy.  He also has moderate glenohumeral arthritis.  His initial pain started after a fall several months ago.  His pain worsens and he began having difficulty with lifting.    Review of Systems   Objective: Vital Signs: There were no vitals taken for this visit.  Physical Exam well-developed and well-nourished gentleman in no acute distress.  Alert and oriented x3.  Ortho Exam stable left shoulder exam  Specialty Comments:  No specialty comments available.  Imaging: No new imaging   PMFS History: Patient Active Problem List   Diagnosis Date Noted   . Chronic hepatitis C without hepatic coma ([place]) 08/07/2016  . Seizure disorder ([place]) 05/22/2016  . DEPRESSION 09/10/2006  . Essential hypertension 09/10/2006  . PERIPHERAL VASCULAR DISEASE 09/10/2006  . Seizures ([place]) 09/10/2006   Past Medical History:  Diagnosis Date  . Hypertension   . PVD (peripheral vascular disease) ([place])   . Seizures ([place])    most recent Jan 2020    Family History  Problem Relation Age of Onset  . Seizures Father     History reviewed. No pertinent surgical history. Social History   Occupational History    Comment: Arboriculturist  Tobacco Use  . Smoking status: Former Smoker    Quit date: 03/26/1988    Years since quitting: 30.4  . Smokeless tobacco: Never Used  Substance and Sexual Activity  . Alcohol use: No  . Drug use: No  . Sexual activity: Not on file

## 2018-09-08 NOTE — Telephone Encounter (Signed)
Can you please call patient and schedule an appt with Dr Marlou Sa per Dr [name] to discuss reverse shoulder surgery

## 2018-09-14 ENCOUNTER — Ambulatory Visit (INDEPENDENT_AMBULATORY_CARE_PROVIDER_SITE_OTHER): Payer: PPO | Admitting: Orthopedic Surgery

## 2018-09-14 ENCOUNTER — Other Ambulatory Visit: Payer: Self-pay

## 2018-09-14 DIAGNOSIS — M75122 Complete rotator cuff tear or rupture of left shoulder, not specified as traumatic: Secondary | ICD-10-CM

## 2018-09-16 ENCOUNTER — Encounter: Payer: Self-pay | Admitting: Orthopedic Surgery
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
## 2018-10-14 ENCOUNTER — Other Ambulatory Visit: Payer: Self-pay | Admitting: Family Medicine

## 2018-10-14 DIAGNOSIS — R21 Rash and other nonspecific skin eruption: Secondary | ICD-10-CM

## 2018-10-14 NOTE — Telephone Encounter (Signed)
Requested Prescriptions  Pending Prescriptions Disp Refills  . triamcinolone cream (KENALOG) 0.1 % [Pharmacy Med Name: TRIAMCINOLONE 0.1% CREAM] 30 g 0    Sig: APPLY TO AFFECTED AREA TWICE A DAY     Dermatology:  Corticosteroids Passed - 10/14/2018 10:38 AM      Passed - Valid encounter within last 12 months    Recent Outpatient Visits          2 months ago Left shoulder pain, unspecified chronicity   [facility], Ranell Patrick, MD   3 months ago Essential hypertension   [facility], Lilia Argue, MD   6 months ago Essential hypertension   [facility], MD   6 months ago Essential hypertension   [facility], Ranell Patrick, MD   7 months ago Low bicarbonate   [facility], Ranell Patrick, MD

## 2018-10-26 ENCOUNTER — Other Ambulatory Visit: Payer: Self-pay | Admitting: Neurology

## 2018-10-26 DIAGNOSIS — R569 Unspecified convulsions: Secondary | ICD-10-CM

## 2018-11-16 ENCOUNTER — Other Ambulatory Visit: Payer: Self-pay | Admitting: Family Medicine

## 2018-11-16 DIAGNOSIS — M792 Neuralgia and neuritis, unspecified: Secondary | ICD-10-CM

## 2018-11-16 NOTE — Telephone Encounter (Signed)
Requested medication (s) are due for refill today: yes  Requested medication (s) are on the active medication list:yes   Last refill:  08/23/2018  Future visit scheduled: no  Notes to clinic:  Review for refill   Requested Prescriptions  Pending Prescriptions Disp Refills   gabapentin (NEURONTIN) 100 MG capsule [Pharmacy Med Name: GABAPENTIN 100 MG CAPSULE] 90 capsule 0    Sig: TAKE 1 CAPSULE BY MOUTH ONCE DAILY IF NEEDED     Neurology: Anticonvulsants - gabapentin Passed - 11/16/2018  9:31 AM      Passed - Valid encounter within last 12 months    Recent Outpatient Visits          3 months ago Left shoulder pain, unspecified chronicity   [facility], MD   4 months ago Essential hypertension   [facility], Lilia Argue, MD   7 months ago Essential hypertension   [facility], MD   7 months ago Essential hypertension   [facility], Ranell Patrick, MD   8 months ago Low bicarbonate   [facility], Ranell Patrick, MD

## 2018-11-18 ENCOUNTER — Encounter: Payer: Self-pay | Admitting: Podiatry

## 2018-11-18 ENCOUNTER — Ambulatory Visit (INDEPENDENT_AMBULATORY_CARE_PROVIDER_SITE_OTHER): Payer: PPO | Admitting: Podiatry

## 2018-11-18 ENCOUNTER — Other Ambulatory Visit: Payer: Self-pay

## 2018-11-18 DIAGNOSIS — M79675 Pain in left toe(s): Secondary | ICD-10-CM

## 2018-11-18 DIAGNOSIS — I739 Peripheral vascular disease, unspecified: Secondary | ICD-10-CM

## 2018-11-18 DIAGNOSIS — L84 Corns and callosities: Secondary | ICD-10-CM | POA: Diagnosis not present

## 2018-11-18 DIAGNOSIS — M79674 Pain in right toe(s): Secondary | ICD-10-CM

## 2018-11-18 DIAGNOSIS — B351 Tinea unguium: Secondary | ICD-10-CM

## 2018-11-18 NOTE — Patient Instructions (Addendum)
Athlete's Foot  Athlete's foot (tinea pedis) is a fungal infection of the skin on your feet. It often occurs on the skin that is between or underneath the toes. It can also occur on the soles of your feet. The infection can spread from person to person (is contagious). It can also spread when a person's bare feet come in contact with the fungus on shower floors or on items such as shoes. What are the causes? This condition is caused by a fungus that grows in warm, moist places. You can get athlete's foot by sharing shoes, shower stalls, towels, and wet floors with someone who is infected. Not washing your feet or changing your socks often enough can also lead to athlete's foot. What increases the risk? This condition is more likely to develop in:  Men.  People who have a weak body defense system (immune system).  People who have diabetes.  People who use public showers, such as at a gym.  People who wear heavy-duty shoes, such as industrial or military shoes.  Seasons with warm, humid weather. What are the signs or symptoms? Symptoms of this condition include:  Itchy areas between your toes or on the soles of your feet.  White, flaky, or scaly areas between your toes or on the soles of your feet.  Very itchy small blisters between your toes or on the soles of your feet.  Small cuts in your skin. These cuts can become infected.  Thick or discolored toenails. How is this diagnosed? This condition may be diagnosed with a physical exam and a review of your medical history. Your health care provider may also take a skin or toenail sample to examine under a microscope. How is this treated? This condition is treated with antifungal medicines. These may be applied as powders, ointments, or creams. In severe cases, an oral antifungal medicine may be given. Follow these instructions at home: Medicines  Apply or take over-the-counter and prescription medicines only as told by your health  care provider.  Apply your antifungal medicine as told by your health care provider. Do not stop using the antifungal even if your condition improves. Foot care  Do not scratch your feet.  Keep your feet dry: ? Wear cotton or wool socks. Change your socks every day or if they become wet. ? Wear shoes that allow air to flow, such as sandals or canvas tennis shoes.  Wash and dry your feet, including the area between your toes. Also, wash and dry your feet: ? Every day or as told by your health care provider. ? After exercising. General instructions  Do not let others use towels, shoes, nail clippers, or other personal items that touch your feet.  Protect your feet by wearing sandals in wet areas, such as locker rooms and shared showers.  Keep all follow-up visits as told by your health care provider. This is important.  If you have diabetes, keep your blood sugar under control. Contact a health care provider if:  You have a fever.  You have swelling, soreness, warmth, or redness in your foot.  Your feet are not getting better with treatment.  Your symptoms get worse.  You have new symptoms. Summary  Athlete's foot (tinea pedis) is a fungal infection of the skin on your feet. It often occurs on skin that is between or underneath the toes.  This condition is caused by a fungus that grows in warm, moist places.  Symptoms include white, flaky, or scaly areas between   your toes or on the soles of your feet.  This condition is treated with antifungal medicines.  Keep your feet clean. Always dry them thoroughly. This information is not intended to replace advice given to you by your health care provider. Make sure you discuss any questions you have with your health care provider. Document Released: 03/01/2000 Document Revised: 02/27/2017 Document Reviewed: 12/23/2016 Elsevier Patient Education  Dows are small areas of thickened skin  that occur on the top, sides, or tip of a toe. They contain a cone-shaped core with a point that can press on a nerve below. This causes pain.  Calluses are areas of thickened skin that can occur anywhere on the body, including the hands, fingers, palms, soles of the feet, and heels. Calluses are usually larger than corns. What are the causes? Corns and calluses are caused by rubbing (friction) or pressure, such as from shoes that are too tight or do not fit properly. What increases the risk? Corns are more likely to develop in people who have misshapen toes (toe deformities), such as hammer toes. Calluses can occur with friction to any area of the skin. They are more likely to develop in people who:  Work with their hands.  Wear shoes that fit poorly, are too tight, or are high-heeled.  Have toe deformities. What are the signs or symptoms? Symptoms of a corn or callus include:  A hard growth on the skin.  Pain or tenderness under the skin.  Redness and swelling.  Increased discomfort while wearing tight-fitting shoes, if your feet are affected. If a corn or callus becomes infected, symptoms may include:  Redness and swelling that gets worse.  Pain.  Fluid, blood, or pus draining from the corn or callus. How is this diagnosed? Corns and calluses may be diagnosed based on your symptoms, your medical history, and a physical exam. How is this treated? Treatment for corns and calluses may include:  Removing the cause of the friction or pressure. This may involve: ? Changing your shoes. ? Wearing shoe inserts (orthotics) or other protective layers in your shoes, such as a corn pad. ? Wearing gloves.  Applying medicine to the skin (topical medicine) to help soften skin in the hardened, thickened areas.  Removing layers of dead skin with a file to reduce the size of the corn or callus.  Removing the corn or callus with a scalpel or laser.  Taking antibiotic medicines, if your  corn or callus is infected.  Having surgery, if a toe deformity is the cause. Follow these instructions at home:   Take over-the-counter and prescription medicines only as told by your health care provider.  If you were prescribed an antibiotic, take it as told by your health care provider. Do not stop taking it even if your condition starts to improve.  Wear shoes that fit well. Avoid wearing high-heeled shoes and shoes that are too tight or too loose.  Wear any padding, protective layers, gloves, or orthotics as told by your health care provider.  Soak your hands or feet and then use a file or pumice stone to soften your corn or callus. Do this as told by your health care provider.  Check your corn or callus every day for symptoms of infection. Contact a health care provider if you:  Notice that your symptoms do not improve with treatment.  Have redness or swelling that gets worse.  Notice that your corn or callus becomes painful.  Have fluid, blood, or pus coming from your corn or callus.  Have new symptoms. Summary  Corns are small areas of thickened skin that occur on the top, sides, or tip of a toe.  Calluses are areas of thickened skin that can occur anywhere on the body, including the hands, fingers, palms, and soles of the feet. Calluses are usually larger than corns.  Corns and calluses are caused by rubbing (friction) or pressure, such as from shoes that are too tight or do not fit properly.  Treatment may include wearing any padding, protective layers, gloves, or orthotics as told by your health care provider. This information is not intended to replace advice given to you by your health care provider. Make sure you discuss any questions you have with your health care provider. Document Released: 12/09/2003 Document Revised: 06/24/2018 Document Reviewed: 01/15/2017 Elsevier Patient Education  [place]? An  infection that lies within the keratin of your nail plate that is caused by a fungus.  WHY ME? Fungal infections affect all ages, sexes, races, and creeds.  There may be many factors that predispose you to a fungal infection such as age, coexisting medical conditions such as diabetes, or an autoimmune disease; stress, medications, fatigue, genetics, etc.  Bottom line: fungus thrives in a warm, moist environment and your shoes offer such a location.  IS IT CONTAGIOUS? Theoretically, yes.  You do not want to share shoes, nail clippers or files with someone who has fungal toenails.  Walking around barefoot in the same room or sleeping in the same bed is unlikely to transfer the organism.  It is important to realize, however, that fungus can spread easily from one nail to the next on the same foot.  HOW DO WE TREAT THIS?  There are several ways to treat this condition.  Treatment may depend on many factors such as age, medications, pregnancy, liver and kidney conditions, etc.  It is best to ask your doctor which options are available to you.  1. No treatment.   Unlike many other medical concerns, you can live with this condition.  However for many people this can be a painful condition and may lead to ingrown toenails or a bacterial infection.  It is recommended that you keep the nails cut short to help reduce the amount of fungal nail. 2. Topical treatment.  These range from herbal remedies to prescription strength nail lacquers.  About 40-50% effective, topicals require twice daily application for approximately 9 to 12 months or until an entirely new nail has grown out.  The most effective topicals are medical grade medications available through physicians offices. 3. Oral antifungal medications.  With an 80-90% cure rate, the most common oral medication requires 3 to 4 months of therapy and stays in your system for a year as the new nail grows out.  Oral antifungal medications do require blood work to make  sure it is a safe drug for you.  A liver function panel will be performed prior to starting the medication and after the first month of treatment.  It is important to have the blood work performed to avoid any harmful side effects.  In general, this medication safe but blood work is required. 4. Laser Therapy.  This treatment is performed by applying a specialized laser to the affected nail plate.  This therapy is noninvasive, fast, and non-painful.  It is not covered by insurance and is therefore, out of pocket.  The results have been very good with a 80-95% cure rate.  [facility] is the only practice in the area to offer this therapy. 5. Permanent Nail Avulsion.  Removing the entire nail so that a new nail will not grow back.

## 2018-11-24 NOTE — Progress Notes (Signed)
Subjective: Jeffrey Serrano presents to clinic with cc of painful mycotic toenails b/l feet which are aggravated when weightbearing with and without shoe gear.  This pain limits his daily activities. Pain symptoms resolve with periodic professional debridement.  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     Current Outpatient Medications:  .  Cholecalciferol (VITAMIN D3) 1000 units CAPS, Take by mouth., Disp: , Rfl:  .  gabapentin (NEURONTIN) 100 MG capsule, TAKE 1 CAPSULE BY MOUTH ONCE DAILY IF NEEDED, Disp: 30 capsule, Rfl: 0 .  hydrochlorothiazide (HYDRODIURIL) 12.5 MG tablet, TAKE 1 TABLET BY MOUTH EVERY DAY, Disp: 90 tablet, Rfl: 0 .  HYDROcodone-acetaminophen (NORCO/VICODIN) 5-325 MG tablet, Take 1 tablet by mouth every 6 (six) hours as needed for moderate pain., Disp: 10 tablet, Rfl: 0 .  ibuprofen (ADVIL,MOTRIN) 200 MG tablet, Take 200 mg by mouth every 6 (six) hours as needed., Disp: , Rfl:  .  Lacosamide (VIMPAT) 100 MG TABS, Take 1 tablet by mouth twice a day, Disp: 180 tablet, Rfl: 1 .  lisinopril (ZESTRIL) 10 MG tablet, TAKE 1 TABLET BY MOUTH EVERY DAY, Disp: 90 tablet, Rfl: 0 .  triamcinolone cream (KENALOG) 0.1 %, APPLY TO AFFECTED AREA TWICE A DAY, Disp: 30 g, Rfl: 0   No Known Allergies   Objective: Physical Examination:  Vascular  Examination: Capillary refill time delayed x 10 digits.  DP/PT pulses absent b/l.  Digital hair absent b/l.  No edema noted b/l.  Skin temperature gradient WNL b/l.  Dermatological Examination: Skin is thin and atrophic b/l.  No open wounds b/l.  No interdigital macerations noted b/l.  Elongated, thick, discolored brittle toenails with subungual debris and pain on dorsal palpation of nailbeds 1-5 b/l.  Hyperkeratotic lesion submet head 5 right foot and lateral 5th digit DIPJ with tenderness to palpation. No edema, no erythema, no drainage, no flocculence.  5th digit lesions are preulcerative in nature. No erythema, no edema, no drainage, no flocculence.  Wearing black lace up dress shoes and this is what he wears to work and on daily basis.  Musculoskeletal Examination: Muscle strength 5/5 to all muscle groups b/l.  Adductovarus 5th digits b/l.  No pain, crepitus or joint discomfort with active/passive ROM.  Neurological Examination: Sensation intact 5/5 b/l with 10 gram monofilament.  Vibratory sensation intact b/l.  Proprioceptive sensation intact b/l.  Assessment: 1. Mycotic nail infection with pain 1-5 b/l 2. Preulcerative corns b/l 5th digits 3. Callus submet head 5 right foot 4. PAD  Plan: 1. Toenails 1-5 b/l were debrided in length and girth without iatrogenic laceration. 2. Corn(s) pared b/l 5th digits utilizing sterile scalpel blade without incident. Patient instructed to apply antibiotic ointment to digits once daily for one week. Dispensed silipos toe caps for b/l 5th digits Callus pared submetatarsal head(s) 5 right foot utilizing sterile scalpel blade without incident. Written Rx given for patient to purchase a pair of [employer] Sneakers, series 600 or higher.  Report any pedal injuries to medical professional. Follow up 9 weeks. Patient/POA to call should there be a question/concern in there interim.

## 2018-12-02 ENCOUNTER — Other Ambulatory Visit: Payer: Self-pay | Admitting: Family Medicine

## 2018-12-02 DIAGNOSIS — I10 Essential (primary) hypertension: Secondary | ICD-10-CM

## 2018-12-08 ENCOUNTER — Telehealth: Payer: Self-pay | Admitting: Neurology

## 2018-12-08 NOTE — Telephone Encounter (Signed)
Pt's brother Jeffrey Serrano on [place] called stating that the pt had several seizures this weekend and he is wanting to be advised on what he should do for his brother because the pt is taking his medications the way is supposed to but still ended up having seizures Saturday and Sunday evening. Please advise.

## 2018-12-08 NOTE — Telephone Encounter (Signed)
I called and spoke to brother, [name].  Pt has had 3 seizures recently.  No triggers noted per brother.  Taking lacosamide 100mg  po bid.  Made appt to evaluate tomorrow at 1545, arrive 1515.  He verbalized understanding.

## 2018-12-09 ENCOUNTER — Other Ambulatory Visit: Payer: Self-pay

## 2018-12-09 ENCOUNTER — Ambulatory Visit (INDEPENDENT_AMBULATORY_CARE_PROVIDER_SITE_OTHER): Payer: PPO | Admitting: Adult Health

## 2018-12-09 ENCOUNTER — Encounter: Payer: Self-pay | Admitting: Adult Health

## 2018-12-09 VITALS — BP 124/88 | HR 85 | Temp 97.6°F | Ht 68.0 in | Wt 153.2 lb

## 2018-12-09 DIAGNOSIS — Z79899 Other long term (current) drug therapy: Secondary | ICD-10-CM

## 2018-12-09 DIAGNOSIS — G40909 Epilepsy, unspecified, not intractable, without status epilepticus: Secondary | ICD-10-CM | POA: Diagnosis not present

## 2018-12-09 DIAGNOSIS — D72829 Elevated white blood cell count, unspecified: Secondary | ICD-10-CM | POA: Diagnosis not present

## 2018-12-09 NOTE — Patient Instructions (Signed)
Your Plan:  Continue Vimpat 100 mg twice daily  We will check lab work to make sure your electrolytes look good along with ensuring there is no infection anywhere  Please let us know if you experience any additional seizures we may have to do additional testing or increase dosage   Follow-up in 4 months or call earlier if needed     Thank you for coming to see [place] at [facility]. I hope we have been able to provide you high quality care today.  You may receive a patient satisfaction survey over the next few weeks. We would appreciate your feedback and comments so that we may continue to improve ourselves and the health of our patients.

## 2018-12-09 NOTE — Progress Notes (Signed)
I agree with the above plan 

## 2018-12-09 NOTE — Progress Notes (Signed)
[facility]  PATIENT: Jeffrey Serrano DOB: [birth year]   REASON FOR VISIT: Follow-up for seizures HISTORY FROM: Patient and brother    HISTORY OF PRESENT ILLNESS:Jeffrey Serrano is a 68 y.o. male with PMH of Seizure disorder who presents as a new patient for Seizure. Patient complains by his brother, however both of them are poor historian, and not able to provide detailed information. However as per his brother, patient had history of a seizure disorder on Dilantin. About one month ago, he was getting out of the elevator, suddenly fell. Brother was able to hold him and lowered him to the ground. [name] witnessed all 4 extremities shivering and stiffness, lasting about 2-3 minutes. Denies any tongue biting, urinary or bowel incontinence. After that episode, as per brother he woke up, without a confusion or any difficulty, and went on to work that day.  As per note, he had ED visit on 05/10/2013 due to seizure. 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   He and his brother cannot tell when he started to have seizure, when Dilantin was started and how often of the seizure. Patient only takes Dilantin 100 mg every day. However, the instruction written is for Dilantin 100 mg twice a day. Brother does note that patient had significant memory issues, not able to remember things, this is at least for about one year. However he cannot provide a further information and stated that patient wife may know it.  He denies any cigarette smoking, alcohol or illicit drugs. He is still working every day, however, he is not driving and he is taking bus to work.  12/04/15 follow up - pt has been doing well. No seizure episodes. Had EEG which was normal EEG. Has not have MRI done yet. On vimpat bid. Still on dilantin bid and asked pt to discontinue.  Otherwise, no complains. He is not driving.  Interval history:10/08/16 Dr. [name] During the interval time, pt has been doing well. No more seizures. Had MRI brain with and without contrast unremarkable. Continued on vimpat 100mg  bid. He has to pay $285 for 3 months supply. He is so far able to afford. His BP today is high at 152/97. He is on lisinopril 10mg  and HCTZ 12.5mg . Encourage pt check BP at home.  UPDATE 1/24/2019[name], 68 year old male returns for follow-up with his brother with history of seizure disorder.  He has not had any seizures in several years.  He is currently on Vimpat and having difficulty affording this.  He is applied for patient assistance but does not know the outcome.  He does not drive.  MRI of the brain in the past is been unremarkable.  EEG was normal.  He is retired.  He returns for reevaluation He has no new neurologic complaints UPDATE 1/27/2020[name] 68 year old male returns for follow-up with his brother he has a history of seizure disorder.  He has had a total of 3 seizures in the last year he is currently on Vimpat and has applied for patient assistance last year he was made aware that he needs to renew that every year.  He does not drive.  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  EEG was normal.  He is going to be out  of his medication and was made aware that with patient assistance he may need to get it locally until he reapplies.  He has a history of arthritis . he returns for reevaluation  Update 12/09/2018: Jeffrey Serrano is being seen today accompanied by his brother due to recent onset of seizures.  Seizures initially started Sunday evening consisting of whole-body shaking lasting for approximately 10 to 12 minutes and witnessed by his brother.  Patient unable to follow commands or respond during event and does not remember event during.  Denies confusion or lethargy immediately following.  He had a second  event occurred Monday morning consisting of same symptoms but brother reports increased fatigue over the past couple of days.  He has not had any reoccurring events since that time.  He reports ongoing compliance with Vimpat 100 mg twice daily.  He denies change in medications, diet or stress levels.  Denies recent illness or infection.     REVIEW OF SYSTEMS: Full 14 system review of systems performed and notable only for those listed, all others are neg:  Constitutional: neg  Cardiovascular: neg Ear/Nose/Throat: neg  Skin: neg Eyes: neg Respiratory: neg Gastroitestinal: neg  Hematology/Lymphatic: neg  Endocrine: neg Musculoskeletal:neg Allergy/Immunology: neg Neurological: Seizures Psychiatric: neg Sleep : neg   ALLERGIES: No Known Allergies  HOME MEDICATIONS: Outpatient Medications Prior to Visit  Medication Sig Dispense Refill  . Cholecalciferol (VITAMIN D3) 1000 units CAPS Take by mouth.    . gabapentin (NEURONTIN) 100 MG capsule TAKE 1 CAPSULE BY MOUTH ONCE DAILY IF NEEDED 30 capsule 0  . hydrochlorothiazide (HYDRODIURIL) 12.5 MG tablet TAKE 1 TABLET BY MOUTH EVERY DAY 90 tablet 0  . HYDROcodone-acetaminophen (NORCO/VICODIN) 5-325 MG tablet Take 1 tablet by mouth every 6 (six) hours as needed for moderate pain. 10 tablet 0  . ibuprofen (ADVIL,MOTRIN) 200 MG tablet Take 200 mg by mouth every 6 (six) hours as needed.    . Lacosamide (VIMPAT) 100 MG TABS Take 1 tablet by mouth twice a day 180 tablet 1  . lisinopril (ZESTRIL) 10 MG tablet TAKE 1 TABLET BY MOUTH EVERY DAY 90 tablet 0  . triamcinolone cream (KENALOG) 0.1 % APPLY TO AFFECTED AREA TWICE A DAY 30 g 0   No facility-administered medications prior to visit.     PAST MEDICAL HISTORY: Past Medical History:  Diagnosis Date  . Hypertension   . PVD (peripheral vascular disease) ([place])   . Seizures ([place])    most recent Jan 2020    PAST SURGICAL HISTORY: No past surgical history on file.  FAMILY HISTORY: Family  History  Problem Relation Age of Onset  . Seizures Father     SOCIAL HISTORY: Social History   Socioeconomic History  . Marital status: Legally Separated    Spouse name: Not on file  . Number of children: Not on file  . Years of education: Not on file  . 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 Financial resource strain: Not on file  . Food insecurity    Worry: Not on file    Inability: Not on file  . Transportation needs    Medical: Not on file    Non-medical: Not on file  Tobacco Use  . Smoking status: Former Smoker    Quit date: 03/26/1988    Years since quitting: 30.7  . Smokeless tobacco: Never Used  Substance and Sexual Activity  . Alcohol use: No  . Drug  use: No  . Sexual activity: Not on file  Lifestyle  . Physical activity    Days per week: Not on file    Minutes per session: Not on file  . Stress: Not on file  Relationships  . Social Herbalist on phone: Not on file    Gets together: Not on file    Attends religious service: Not on file    Active member of club or organization: Not on file    Attends meetings of clubs or organizations: Not on file    Relationship status: Not on file  . Intimate partner violence    Fear of current or ex partner: Not on file    Emotionally abused: Not on file    Physically abused: Not on file    Forced sexual activity: Not on file  Other Topics Concern  . Not on file  Social History Narrative   04/13/18 living with brother, [name]     PHYSICAL EXAM  Vitals:   12/09/18 1530  BP: 124/88  Pulse: 85  Temp: 97.6 F (36.4 C)  Weight: 153 lb 3.2 oz (69.5 kg)  Height: 5\' 8"  (1.727 m)   Body mass index is 23.29 kg/m.  General: well developed, well nourished,  pleasant middle-aged African-American male, seated, in no evident distress Head: head normocephalic and atraumatic.   Neck: supple with no carotid or supraclavicular bruits  Cardiovascular: regular rate and rhythm, no murmurs Musculoskeletal: no deformity Skin:  no rash/petichiae Vascular:  Normal pulses all extremities   Neurologic Exam Mental Status: Awake and fully alert. Oriented to place and time. Recent and remote memory intact. Attention span, concentration and fund of knowledge appropriate. Mood and affect appropriate.  Cranial Nerves: Fundoscopic exam reveals sharp disc margins. Pupils equal, briskly reactive to light. Extraocular movements full without nystagmus. Visual fields full to confrontation. Hearing intact. Facial sensation intact. Face, tongue, palate moves normally and symmetrically.  Motor: Normal bulk and tone. Normal strength in all tested extremity muscles. Sensory.: intact to touch , pinprick , position and vibratory sensation.  Coordination: Rapid alternating movements normal in all extremities. Finger-to-nose and heel-to-shin performed accurately bilaterally. Gait and Station: Arises from chair without difficulty. Stance is normal. Gait demonstrates normal stride length and balance Reflexes: 1+ and symmetric. Toes downgoing.    DIAGNOSTIC DATA (LABS, IMAGING, TESTING) - I reviewed patient records, labs, notes, testing and imaging myself where available.  Lab Results  Component Value Date   WBC 12.9 (H) 08/07/2016   HGB 16.2 08/07/2016   HCT 47.4 08/07/2016   MCV 89.9 08/07/2016   PLT 215 08/07/2016      Component Value Date/Time   NA 136 06/26/2018 1125   K 4.1 06/26/2018 1125   CL 99 06/26/2018 1125   CO2 19 (L) 06/26/2018 1125   GLUCOSE 93 06/26/2018 1125   GLUCOSE 95 08/07/2016 1403   BUN 10 06/26/2018 1125   CREATININE 1.17 06/26/2018 1125   CREATININE 1.61 (H) 08/07/2016 1403   CALCIUM 9.5 06/26/2018 1125   PROT 7.1 06/26/2018 1125   ALBUMIN 4.1 06/26/2018 1125   AST 23 06/26/2018 1125   ALT 12 06/26/2018 1125   ALT 42 08/07/2016 1403   ALKPHOS 60 06/26/2018 1125   BILITOT 0.6 06/26/2018 1125   GFRNONAA 63  06/26/2018 1125   GFRNONAA 44 (L) 08/07/2016 1403   GFRAA 73 06/26/2018 1125   GFRAA 50 (L) 08/07/2016 1403   Lab Results  Component Value Date   CHOL 152  06/26/2018   HDL 46 06/26/2018   LDLCALC 94 06/26/2018   TRIG 59 06/26/2018   CHOLHDL 3.3 06/26/2018   Lab Results  Component Value Date   HGBA1C 6.0 (H) 06/26/2018   Lab Results  Component Value Date   [id number] 07/21/2009   Lab Results  Component Value Date   TSH 1.129 07/21/2009      ASSESSMENT AND PLAN Jeffrey Serrano is a 68 y.o. male with PMH of HepC and Seizure disorder follows up for seizure evaluation. Patient and his brother are both poor historian, not sure about when seizure started, when Dilantin was started, and how often of seizure. Apparently, he had seizure in 04/2013 and in 08/2015, during both seizures, he lost consciousness and fell.  AED switched to Vimpat 100 mg twice daily and has been doing well until recently over the weekend he experienced seizure activity on Sunday evening and then again Monday morning.  He endorses ongoing compliance with AED.   Plan: -Discussion regarding potential breakthrough seizure despite medication compliance but will do lab work to ensure no electrolyte abnormality or acute infection.  We will also check Vimpat level.  Advised to call office with any additional seizure events potential need of repeating EEG or increasing dosage -Continue Vimpat 100 mg twice daily at this time -Discussion regarding avoidance of seizure provoking activity  Follow-up as scheduled in 03/2019  Greater than 50% of this 15-minute visit was spent discussing reoccurring seizure activity and potential underlying causes and answered all questions to patient mother satisfaction  Frann Rider, AGNP-BC  [facility] [address] [place],  [address]  Phone [phone] Fax [phone] Note: This document was prepared with digital dictation and possible  smart phrase technology. Any transcriptional errors that result from this process are unintentional.

## 2018-12-10 ENCOUNTER — Other Ambulatory Visit: Payer: Self-pay

## 2018-12-10 ENCOUNTER — Other Ambulatory Visit (INDEPENDENT_AMBULATORY_CARE_PROVIDER_SITE_OTHER): Payer: Self-pay

## 2018-12-10 ENCOUNTER — Telehealth: Payer: Self-pay | Admitting: *Deleted

## 2018-12-10 DIAGNOSIS — D72829 Elevated white blood cell count, unspecified: Secondary | ICD-10-CM | POA: Diagnosis not present

## 2018-12-10 DIAGNOSIS — Z0289 Encounter for other administrative examinations: Secondary | ICD-10-CM

## 2018-12-10 NOTE — Telephone Encounter (Signed)
LMVM for pt, then [facility] for brother [name].   His lab work from yesterday showed a slight elevation of his white count.  It is requested that pt come back into the office to check his urine for possible UTI as source of elevated white count.  Also, his kidney function has slightly worsened and advised to follow-up with his PCP for further evaluation.  We are currently awaiting his Vimpat level it may consider increasing based on results.  He is to call back if questions.

## 2018-12-10 NOTE — Addendum Note (Signed)
Addended by: Mal Misty on: 12/10/2018 10:11 AM   Modules accepted: Orders

## 2018-12-10 NOTE — Telephone Encounter (Signed)
-----   Message from Frann Rider, NP sent at 12/10/2018 10:10 AM EDT ----- Please advise patient that his lab work from yesterday showed a slight elevation of his white count.  It is requested that he come back into the office to check his urine for possible UTI as source of elevated white count.  Also, his kidney function has slightly worsened and advised to follow-up with his PCP for further evaluation.  We are currently awaiting his Vimpat level it may consider increasing based on results

## 2018-12-10 NOTE — Telephone Encounter (Signed)
Pt arrived and had UA done.

## 2018-12-11 LAB — CBC WITH DIFFERENTIAL/PLATELET
Basophils Absolute: 0.1 10*3/uL (ref 0.0–0.2)
Basos: 1 %
EOS (ABSOLUTE): 0.1 10*3/uL (ref 0.0–0.4)
Eos: 1 %
Hematocrit: 47.8 % (ref 37.5–51.0)
Hemoglobin: 17 g/dL (ref 13.0–17.7)
Immature Grans (Abs): 0 10*3/uL (ref 0.0–0.1)
Immature Granulocytes: 0 %
Lymphocytes Absolute: 4.1 10*3/uL — ABNORMAL HIGH (ref 0.7–3.1)
Lymphs: 36 %
MCH: 33.3 pg — ABNORMAL HIGH (ref 26.6–33.0)
MCHC: 35.6 g/dL (ref 31.5–35.7)
MCV: 94 fL (ref 79–97)
Monocytes Absolute: 1.1 10*3/uL — ABNORMAL HIGH (ref 0.1–0.9)
Monocytes: 9 %
Neutrophils Absolute: 6 10*3/uL (ref 1.4–7.0)
Neutrophils: 53 %
Platelets: 243 10*3/uL (ref 150–450)
RBC: 5.1 x10E6/uL (ref 4.14–5.80)
RDW: 12.8 % (ref 11.6–15.4)
WBC: 11.4 10*3/uL — ABNORMAL HIGH (ref 3.4–10.8)

## 2018-12-11 LAB — MICROSCOPIC EXAMINATION
Casts: NONE SEEN /lpf
RBC, Urine: NONE SEEN /hpf (ref 0–2)

## 2018-12-11 LAB — COMPREHENSIVE METABOLIC PANEL
ALT: 24 IU/L (ref 0–44)
AST: 32 IU/L (ref 0–40)
Albumin/Globulin Ratio: 1.5 (ref 1.2–2.2)
Albumin: 4.6 g/dL (ref 3.8–4.8)
Alkaline Phosphatase: 50 IU/L (ref 39–117)
BUN/Creatinine Ratio: 13 (ref 10–24)
BUN: 20 mg/dL (ref 8–27)
Bilirubin Total: 1.8 mg/dL — ABNORMAL HIGH (ref 0.0–1.2)
CO2: 23 mmol/L (ref 20–29)
Calcium: 9.9 mg/dL (ref 8.6–10.2)
Chloride: 96 mmol/L (ref 96–106)
Creatinine, Ser: 1.56 mg/dL — ABNORMAL HIGH (ref 0.76–1.27)
GFR calc Af Amer: 52 mL/min/{1.73_m2} — ABNORMAL LOW (ref >59)
GFR calc non Af Amer: 45 mL/min/{1.73_m2} — ABNORMAL LOW (ref >59)
Globulin, Total: 3 g/dL (ref 1.5–4.5)
Glucose: 106 mg/dL — ABNORMAL HIGH (ref 65–99)
Potassium: 3.9 mmol/L (ref 3.5–5.2)
Sodium: 135 mmol/L (ref 134–144)
Total Protein: 7.6 g/dL (ref 6.0–8.5)

## 2018-12-11 LAB — URINALYSIS, ROUTINE W REFLEX MICROSCOPIC
Bilirubin, UA: NEGATIVE
Glucose, UA: NEGATIVE
Ketones, UA: NEGATIVE
Nitrite, UA: NEGATIVE
Protein,UA: NEGATIVE
RBC, UA: NEGATIVE
Specific Gravity, UA: 1.023 (ref 1.005–1.030)
Urobilinogen, Ur: 1 mg/dL (ref 0.2–1.0)
pH, UA: 5.5 (ref 5.0–7.5)

## 2018-12-11 LAB — LACOSAMIDE: Lacosamide: 10.9 ug/mL — ABNORMAL HIGH (ref 5.0–10.0)

## 2018-12-14 ENCOUNTER — Telehealth: Payer: Self-pay | Admitting: Adult Health

## 2018-12-14 DIAGNOSIS — R569 Unspecified convulsions: Secondary | ICD-10-CM

## 2018-12-14 MED ORDER — LACOSAMIDE 150 MG PO TABS: 150.0000 mg | ORAL_TABLET | Freq: Two times a day (BID) | ORAL | 3 refills | Status: DC

## 2018-12-14 NOTE — Telephone Encounter (Signed)
Recent breakthrough seizures despite compliance of lacosamide 100 mg twice daily.  CBC did show slight elevation of WBC therefore urinalysis obtained for potential urinary tract infection causing breakthrough seizures.  UA unremarkable.  Recommend increasing lacosamide dose to 150 mg twice daily due to breakthrough seizure.  Recommend repeating CBC and follow-up with PCP.

## 2018-12-14 NOTE — Addendum Note (Signed)
Addended by: Mal Misty on: 12/14/2018 12:49 PM   Modules accepted: Orders

## 2018-12-14 NOTE — Telephone Encounter (Signed)
I called and spoke to brother of pt.  I relayed that due to breakthru seizures JM/NP wanted to increase lacosamide to 150mg  po bid.  He uses mail order pharmacy, [facility]. (will reorder to that pharmacy).  Due to elevated WBC, she ordered UA and this resulted as unremarkable. She recommended him to f/u with pcp (due to elevated wbc).  He verbalized understanding.

## 2018-12-14 NOTE — Addendum Note (Signed)
Addended by: Brandon Melnick on: 12/14/2018 11:36 AM   Modules accepted: Orders

## 2019-01-04 ENCOUNTER — Telehealth: Payer: Self-pay

## 2019-01-04 NOTE — Telephone Encounter (Signed)
I called and relayed that pt is taking vimpat 150mg  po bid # 60 with 3 refills, an increase from 100mg  po bid from breakthru seizures.  They could not take from a NP (since in [place]) need supervising MD.  I gave WID Dr. Margette Fast.  I spoke to LOC, pharmacist.

## 2019-01-04 NOTE — Telephone Encounter (Signed)
Patient needs a refill on his Lacosamide 150 MG TABS be sent to  [facility], [place] [phone] (Phone) [phone] (Fax)

## 2019-01-04 NOTE — Telephone Encounter (Signed)
Phone rep checked office voicemail; [facility] called @ 11:49 stating they received an invalid script for Vimpat, please call at [phone] option 2

## 2019-01-08 ENCOUNTER — Other Ambulatory Visit: Payer: Self-pay | Admitting: Family Medicine

## 2019-01-08 DIAGNOSIS — M792 Neuralgia and neuritis, unspecified: Secondary | ICD-10-CM

## 2019-01-26 ENCOUNTER — Other Ambulatory Visit: Payer: Self-pay

## 2019-01-26 ENCOUNTER — Ambulatory Visit (INDEPENDENT_AMBULATORY_CARE_PROVIDER_SITE_OTHER): Payer: PPO | Admitting: Podiatry

## 2019-01-26 ENCOUNTER — Encounter: Payer: Self-pay | Admitting: Podiatry

## 2019-01-26 DIAGNOSIS — L84 Corns and callosities: Secondary | ICD-10-CM

## 2019-01-26 DIAGNOSIS — M79674 Pain in right toe(s): Secondary | ICD-10-CM | POA: Diagnosis not present

## 2019-01-26 DIAGNOSIS — M79675 Pain in left toe(s): Secondary | ICD-10-CM

## 2019-01-26 DIAGNOSIS — I739 Peripheral vascular disease, unspecified: Secondary | ICD-10-CM

## 2019-01-26 DIAGNOSIS — B351 Tinea unguium: Secondary | ICD-10-CM

## 2019-01-26 NOTE — Patient Instructions (Signed)

## 2019-01-31 NOTE — Progress Notes (Signed)
Subjective: Jeffrey Serrano presents to clinic with cc of painful mycotic toenails and calluses b/l feet which are aggravated when weightbearing with and without shoe gear.  This pain limits his daily activities. Pain symptoms resolve with periodic professional debridement.  Current Outpatient Medications on File Prior to Visit  Medication Sig Dispense Refill  . Cholecalciferol (VITAMIN D3) 1000 units CAPS Take by mouth.    . gabapentin (NEURONTIN) 100 MG capsule TAKE 1 CAPSULE BY MOUTH ONCE DAILY IF NEEDED 30 capsule 0  . hydrochlorothiazide (HYDRODIURIL) 12.5 MG tablet TAKE 1 TABLET BY MOUTH EVERY DAY 90 tablet 0  . HYDROcodone-acetaminophen (NORCO/VICODIN) 5-325 MG tablet Take 1 tablet by mouth every 6 (six) hours as needed for moderate pain. 10 tablet 0  . ibuprofen (ADVIL,MOTRIN) 200 MG tablet Take 200 mg by mouth every 6 (six) hours as needed.    . Lacosamide 150 MG TABS Take 1 tablet (150 mg total) by mouth 2 (two) times daily. 60 tablet 3  . lisinopril (ZESTRIL) 10 MG tablet TAKE 1 TABLET BY MOUTH EVERY DAY 90 tablet 0  . triamcinolone cream (KENALOG) 0.1 % APPLY TO AFFECTED AREA TWICE A DAY 30 g 0   No current facility-administered medications on file prior to visit.      No Known Allergies   Objective: There were no vitals filed for this visit.  Physical Examination:  Vascular  Examination: Capillary refill time delayed b/l.  Palpable DP/PT pulses absent b/l.  Digital hair absent b/l.  Skin temperature gradient WNL b/l.  Dermatological Examination: Skin is thin and atrophic b/l.  No open wounds b/l.  Elongated, thick, discolored brittle toenails with subungual debris and pain on dorsal palpation of nailbeds 1-5 b/l.  Hyperkeratotic lesion lateral 5th digit DIPJ b/l and submet head 5 b/l with tenderness to palpation. No edema, no erythema, no drainage, no flocculence.   Porokeratotic lesions submet with tenderness to palpation. No erythema, no edema, no drainage, no  flocculence.   Musculoskeletal Examination: Muscle strength 5/5 to all muscle groups b/l.  No pain, crepitus or joint discomfort with active/passive ROM.  Neurological Examination: Sensation intact 5/5 b/l with 10 gram monofilament.  Vibratory sensation intact b/l.  Assessment: 1. Mycotic nail infection with pain 1-5 b/l 2. Lister's Corn b/l 5th digit 3. Calluses submet head 5 b/l 4. PAD  Plan: 1. Toenails 1-5 b/l were debrided in length and girth without iatrogenic laceration. 2. Hyperkeratotic lesions pared submet head 5 b/l and b/l 5th digits utilizing sterile scalpel blade without incident. 3. Continue soft, supportive shoe gear daily. 4. Report any pedal injuries to medical professional. 5. Follow up 9 week.  6. Patient/POA to call should there be a question/concern in there interim.

## 2019-02-07 ENCOUNTER — Other Ambulatory Visit: Payer: Self-pay | Admitting: Family Medicine

## 2019-02-07 DIAGNOSIS — M792 Neuralgia and neuritis, unspecified: Secondary | ICD-10-CM

## 2019-02-09 DIAGNOSIS — I10 Essential (primary) hypertension: Secondary | ICD-10-CM | POA: Diagnosis not present

## 2019-02-09 DIAGNOSIS — Z6826 Body mass index (BMI) 26.0-26.9, adult: Secondary | ICD-10-CM | POA: Diagnosis not present

## 2019-02-09 DIAGNOSIS — E663 Overweight: Secondary | ICD-10-CM | POA: Diagnosis not present

## 2019-02-09 DIAGNOSIS — G40909 Epilepsy, unspecified, not intractable, without status epilepticus: Secondary | ICD-10-CM | POA: Diagnosis not present

## 2019-02-17 ENCOUNTER — Telehealth: Payer: Self-pay | Admitting: *Deleted

## 2019-02-17 NOTE — Telephone Encounter (Signed)
AWV  telephone 

## 2019-03-01 DIAGNOSIS — Z0000 Encounter for general adult medical examination without abnormal findings: Secondary | ICD-10-CM | POA: Diagnosis not present

## 2019-03-01 DIAGNOSIS — Z1159 Encounter for screening for other viral diseases: Secondary | ICD-10-CM | POA: Diagnosis not present

## 2019-03-01 DIAGNOSIS — Z1211 Encounter for screening for malignant neoplasm of colon: Secondary | ICD-10-CM | POA: Diagnosis not present

## 2019-03-01 DIAGNOSIS — Z0001 Encounter for general adult medical examination with abnormal findings: Secondary | ICD-10-CM | POA: Diagnosis not present

## 2019-03-01 DIAGNOSIS — I129 Hypertensive chronic kidney disease with stage 1 through stage 4 chronic kidney disease, or unspecified chronic kidney disease: Secondary | ICD-10-CM | POA: Diagnosis not present

## 2019-03-01 DIAGNOSIS — R7309 Other abnormal glucose: Secondary | ICD-10-CM | POA: Diagnosis not present

## 2019-03-01 DIAGNOSIS — Z008 Encounter for other general examination: Secondary | ICD-10-CM | POA: Diagnosis not present

## 2019-03-01 DIAGNOSIS — Z79899 Other long term (current) drug therapy: Secondary | ICD-10-CM | POA: Diagnosis not present

## 2019-03-01 DIAGNOSIS — Z125 Encounter for screening for malignant neoplasm of prostate: Secondary | ICD-10-CM | POA: Diagnosis not present

## 2019-03-01 DIAGNOSIS — N183 Chronic kidney disease, stage 3 unspecified: Secondary | ICD-10-CM | POA: Diagnosis not present

## 2019-03-01 DIAGNOSIS — F17201 Nicotine dependence, unspecified, in remission: Secondary | ICD-10-CM | POA: Diagnosis not present

## 2019-03-01 DIAGNOSIS — E663 Overweight: Secondary | ICD-10-CM | POA: Diagnosis not present

## 2019-03-12 ENCOUNTER — Other Ambulatory Visit: Payer: Self-pay | Admitting: Family Medicine

## 2019-03-12 DIAGNOSIS — R21 Rash and other nonspecific skin eruption: Secondary | ICD-10-CM

## 2019-03-14 NOTE — Telephone Encounter (Signed)
Forwarding medication refill request to the clinical pool for review. 

## 2019-03-15 ENCOUNTER — Other Ambulatory Visit: Payer: Self-pay

## 2019-03-15 ENCOUNTER — Telehealth: Payer: Self-pay

## 2019-03-15 DIAGNOSIS — R21 Rash and other nonspecific skin eruption: Secondary | ICD-10-CM

## 2019-03-15 MED ORDER — TRIAMCINOLONE ACETONIDE 0.1 % EX CREA: TOPICAL_CREAM | CUTANEOUS | 0 refills | Status: DC

## 2019-03-15 MED ORDER — LACOSAMIDE 150 MG PO TABS: 150.0000 mg | ORAL_TABLET | Freq: Two times a day (BID) | ORAL | 2 refills | Status: DC

## 2019-03-15 NOTE — Telephone Encounter (Signed)
I printed this out and will sign it.

## 2019-03-15 NOTE — Addendum Note (Signed)
Addended by: Britt Bottom on: 03/15/2019 05:16 PM   Modules accepted: Orders

## 2019-03-15 NOTE — Telephone Encounter (Signed)
Katrina, It' time for Renewal for Vimpat. Can you please print hardcopy and get Dr. Felecia Shelling to sign since Dr. Leonie Man is out this is for his patient assistance Qty of 180 and three refills. Thanks Hinton Dyer

## 2019-03-16 NOTE — Telephone Encounter (Signed)
Faxed Renewal for PAP for for Vimpat pending [facility]

## 2019-03-16 NOTE — Telephone Encounter (Signed)
Prescription given to [facility] for processing. Dr.Sater did rx for Dr. Leonie Man.

## 2019-04-06 ENCOUNTER — Telehealth: Payer: Self-pay

## 2019-04-06 MED ORDER — LACOSAMIDE 150 MG PO TABS: 150.0000 mg | ORAL_TABLET | Freq: Two times a day (BID) | ORAL | 1 refills | Status: DC

## 2019-04-06 NOTE — Telephone Encounter (Signed)
[facility] Database Verified LR: 03-15-2019 Qty: 60 Pending appointment: 04-15-2019 Janett Billow)

## 2019-04-06 NOTE — Telephone Encounter (Signed)
A prescription was sent in to use in [facility] for the Vimpat.

## 2019-04-06 NOTE — Telephone Encounter (Signed)
1) Medication(s) Requested (by name): vimpat 2) Pharmacy of Choice: [facility] [place]  [phone]

## 2019-04-06 NOTE — Addendum Note (Signed)
Addended by: Kathrynn Ducking on: 04/06/2019 02:25 PM   Modules accepted: Orders

## 2019-04-15 ENCOUNTER — Encounter: Payer: Self-pay | Admitting: Adult Health

## 2019-04-15 ENCOUNTER — Ambulatory Visit (INDEPENDENT_AMBULATORY_CARE_PROVIDER_SITE_OTHER): Payer: PPO | Admitting: Adult Health

## 2019-04-15 ENCOUNTER — Other Ambulatory Visit: Payer: Self-pay

## 2019-04-15 VITALS — BP 126/77 | HR 68 | Temp 98.4°F | Ht 68.0 in | Wt 162.0 lb

## 2019-04-15 DIAGNOSIS — R569 Unspecified convulsions: Secondary | ICD-10-CM | POA: Diagnosis not present

## 2019-04-15 MED ORDER — VIMPAT 100 MG PO TABS: 100.0000 mg | ORAL_TABLET | Freq: Two times a day (BID) | ORAL | 0 refills | Status: DC

## 2019-04-15 MED ORDER — LACOSAMIDE 50 MG PO TABS: 50.0000 mg | ORAL_TABLET | Freq: Two times a day (BID) | ORAL | 0 refills | Status: DC

## 2019-04-15 NOTE — Progress Notes (Addendum)
[facility]  PATIENT: Jeffrey Serrano DOB: 02-27-1951   REASON FOR VISIT: Follow-up for seizures HISTORY FROM: Patient and brother    HISTORY OF PRESENT ILLNESS:  Jeffrey Serrano is a 69 y.o. male with PMH of Seizure disorder who presents as a new patient for Seizure.  He was initially evaluated by Dr. [name] on 09/29/2015 due to reoccurring seizures despite Dilantin use.  He was transitioned to Vimpat 100 mg twice daily and eventually discontinue Dilantin.  MRI brain w/wo contrast obtained 11/2015 which was unremarkable.  He had been stable throughout the years with routine follow-ups since initial evaluation but was seen on 12/09/2018 due to description of generalized tonic-clonic seizure activity on 12/06/2018 and again on 12/07/2018.  Lab work obtained which was overall unremarkable except slightly elevated WBC with UA normal and slightly elevated kidney function advised to follow-up with PCP.  He endorses ongoing compliance with him at therefore recommended increasing dosage to 150 mg twice daily. Today, 04/14/2018, he remains on Vimpat 150 mg twice daily and has not had any reoccurring seizure events since September.  He is accompanied today by his brother who endorses difficulty affording medication as it is $480 for 1 month supply.  They are currently in the process of applying for financial assistance but only has 1 week of medication left and is not able to afford additional month.  He has been tolerating increased dose without reported side effects.  No further concerns at this time.     REVIEW OF SYSTEMS: Full 14 system review of systems performed and notable only for those listed, all others are neg:  Constitutional: neg  Cardiovascular: neg Ear/Nose/Throat: neg  Skin: neg Eyes: neg Respiratory: neg Gastroitestinal: neg  Hematology/Lymphatic: neg  Endocrine: neg Musculoskeletal:neg Allergy/Immunology: neg Neurological: Seizures Psychiatric: neg Sleep :  neg   ALLERGIES: No Known Allergies  HOME MEDICATIONS: Outpatient Medications Prior to Visit  Medication Sig Dispense Refill  . Cholecalciferol (VITAMIN D3) 1000 units CAPS Take by mouth.    . gabapentin (NEURONTIN) 100 MG capsule TAKE 1 CAPSULE BY MOUTH ONCE DAILY IF NEEDED 30 capsule 0  . hydrochlorothiazide (HYDRODIURIL) 12.5 MG tablet TAKE 1 TABLET BY MOUTH EVERY DAY 90 tablet 0  . HYDROcodone-acetaminophen (NORCO/VICODIN) 5-325 MG tablet Take 1 tablet by mouth every 6 (six) hours as needed for moderate pain. 10 tablet 0  . ibuprofen (ADVIL,MOTRIN) 200 MG tablet Take 200 mg by mouth every 6 (six) hours as needed.    . Lacosamide 150 MG TABS Take 1 tablet (150 mg total) by mouth 2 (two) times daily. 180 tablet 1  . lisinopril (ZESTRIL) 10 MG tablet TAKE 1 TABLET BY MOUTH EVERY DAY 90 tablet 0  . triamcinolone cream (KENALOG) 0.1 % APPLY TO AFFECTED AREA TWICE A DAY 30 g 0   No facility-administered medications prior to visit.    PAST MEDICAL HISTORY: Past Medical History:  Diagnosis Date  . Hypertension   . PVD (peripheral vascular disease) ([place])   . Seizures ([place])    most recent Jan 2020    PAST SURGICAL HISTORY: History reviewed. No pertinent surgical history.  FAMILY HISTORY: Family History  Problem Relation Age of Onset  . Seizures Father     SOCIAL HISTORY: Social History   Socioeconomic History  . Marital status: Legally Separated    Spouse name: Not on file  . Number of children: Not on file  . Years of education: Not on file  . 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 Smoking status: Former Smoker    Quit date: 03/26/1988    Years since quitting: 31.0  . Smokeless tobacco: Never Used  Substance and Sexual Activity  . Alcohol use: No  . Drug use: No  . Sexual activity: Not on file  Other Topics Concern  . Not on file  Social History Narrative   04/13/18 living with brother, Jeffrey Serrano    Social Determinants of Health   Financial Resource Strain:   . Difficulty of Paying Living Expenses: Not on file  Food Insecurity:   . Worried About Charity fundraiser in the Last Year: Not on file  . Ran Out of Food in the Last Year: Not on file  Transportation Needs:   . Lack of Transportation (Medical): Not on file  . Lack of Transportation (Non-Medical): Not on file  Physical Activity:   . Days of Exercise per Week: Not on file  . Minutes of Exercise per Session: Not on file  Stress:   . Feeling of Stress : Not on file  Social Connections:   . Frequency of Communication with Friends and Family: Not on file  . Frequency of Social Gatherings with Friends and Family: Not on file  . Attends Religious Services: Not on file  . Active Member of Clubs or Organizations: Not on file  . Attends Archivist Meetings: Not on file  . Marital Status: Not on file  Intimate Partner Violence:   . Fear of Current or Ex-Partner: Not on file  . Emotionally Abused: Not on file  . Physically Abused: Not on file  . Sexually Abused: Not on file     PHYSICAL EXAM  Vitals:   04/15/19 0932  BP: 126/77  Pulse: 68  Temp: 98.4 F (36.9 C)  TempSrc: Oral  Weight: 162 lb (73.5 kg)  Height: 5\' 8"  (1.727 m)   Body mass index is 24.63 kg/m.  General: well developed, well nourished,  pleasant middle-aged African-American male, seated, in no evident distress Head: head normocephalic and atraumatic.   Neck: supple with no carotid or supraclavicular bruits Cardiovascular: regular rate and rhythm, no murmurs Musculoskeletal: no deformity Skin:  no rash/petichiae Vascular:  Normal pulses all extremities   Neurologic Exam Mental Status: Awake and fully alert.  Normal speech and language. Oriented to place and time. Recent and remote memory intact. Attention span, concentration and fund of knowledge appropriate. Mood and affect appropriate.  Cranial Nerves: Pupils equal, briskly reactive  to light. Extraocular movements full without nystagmus. Visual fields full to confrontation. Hearing intact. Facial sensation intact. Face, tongue, palate moves normally and symmetrically.  Motor: Normal bulk and tone. Normal strength in all tested extremity muscles. Sensory.: intact to touch , pinprick , position and vibratory sensation.  Coordination: Rapid alternating movements normal in all extremities. Finger-to-nose and heel-to-shin performed accurately bilaterally. Gait and Station: Arises from chair without difficulty. Stance is normal. Gait demonstrates normal stride length and balance Reflexes: 1+ and symmetric. Toes downgoing.    DIAGNOSTIC DATA (LABS, IMAGING, TESTING) - I reviewed patient records, labs, notes, testing and imaging myself where available.  Lab Results  Component Value Date   WBC 11.4 (H) 12/09/2018   HGB 17.0 12/09/2018   HCT 47.8 12/09/2018   MCV 94 12/09/2018   PLT 243 12/09/2018      Component Value Date/Time   NA 135 12/09/2018 1606   K 3.9 12/09/2018 1606  CL 96 12/09/2018 1606   CO2 23 12/09/2018 1606   GLUCOSE 106 (H) 12/09/2018 1606   GLUCOSE 95 08/07/2016 1403   BUN 20 12/09/2018 1606   CREATININE 1.56 (H) 12/09/2018 1606   CREATININE 1.61 (H) 08/07/2016 1403   CALCIUM 9.9 12/09/2018 1606   PROT 7.6 12/09/2018 1606   ALBUMIN 4.6 12/09/2018 1606   AST 32 12/09/2018 1606   ALT 24 12/09/2018 1606   ALT 42 08/07/2016 1403   ALKPHOS 50 12/09/2018 1606   BILITOT 1.8 (H) 12/09/2018 1606   GFRNONAA 45 (L) 12/09/2018 1606   GFRNONAA 44 (L) 08/07/2016 1403   GFRAA 52 (L) 12/09/2018 1606   GFRAA 50 (L) 08/07/2016 1403   Lab Results  Component Value Date   CHOL 152 06/26/2018   HDL 46 06/26/2018   LDLCALC 94 06/26/2018   TRIG 59 06/26/2018   CHOLHDL 3.3 06/26/2018   Lab Results  Component Value Date   HGBA1C 6.0 (H) 06/26/2018   Lab Results  Component Value Date   [id number] 07/21/2009   Lab Results  Component Value Date   TSH  1.129 07/21/2009      ASSESSMENT AND PLAN Jeffrey Serrano is a 69 y.o. male with PMH of HepC and Seizure disorder follows up for seizure evaluation. Patient and his brother are both poor historian, not sure about when seizure started or when Dilantin was initially started.  Apparently, he had seizure in 04/2013 and in 08/2015, during both seizures, he lost consciousness and fell.  AED switched to Vimpat 100 mg twice daily and has been doing well until 11/2018 where her brother witnessed 2 separate seizure activity per description generalized tonic-clonic event.  Vimpat dosage increased to 150 mg twice daily without any reoccurring seizure activity    Plan: -Continue Vimpat 150 mg twice daily for seizure prophylaxis -provided 4 weeks worth of samples due to difficulty obtaining an additional month currently applying for financial assistance -Discussion regarding avoidance of seizure provoking activity -Continue to follow with PCP routinely  Follow-up in 6 months or call earlier if needed   Greater than 50% of this 15-minute visit was spent discussing previously increasing Vimpat dosage and discussion regarding financial difficulty as well as importance of compliance for seizure prevention and answered all questions to patient and brother satisfaction  Frann Rider, AGNP-BC  [facility] [address] [place], [place] [address]  Phone [phone] Fax [phone] Note: This document was prepared with digital dictation and possible smart phrase technology. Any transcriptional errors that result from this process are unintentional.  Made any corrections needed, and agree with history, physical, neuro exam,assessment and plan as stated.     Sarina Ill, MD [facility]

## 2019-04-19 ENCOUNTER — Telehealth: Payer: Self-pay

## 2019-04-19 NOTE — Telephone Encounter (Signed)
Called pt @ 11:14 to inform the pt that his F.I.T. test couple not be processed by the lab, but the pt is clear because of the cologuard he had in 2019.pt did not answer. Pt's phone kept ringing I don't believe a VM is set up with pt's home phone. I will try to call pt again.

## 2019-04-28 ENCOUNTER — Encounter: Payer: Self-pay | Admitting: Podiatry

## 2019-04-28 ENCOUNTER — Ambulatory Visit (INDEPENDENT_AMBULATORY_CARE_PROVIDER_SITE_OTHER): Payer: PPO | Admitting: Podiatry

## 2019-04-28 ENCOUNTER — Other Ambulatory Visit: Payer: Self-pay

## 2019-04-28 DIAGNOSIS — L84 Corns and callosities: Secondary | ICD-10-CM

## 2019-04-28 DIAGNOSIS — M79674 Pain in right toe(s): Secondary | ICD-10-CM

## 2019-04-28 DIAGNOSIS — I739 Peripheral vascular disease, unspecified: Secondary | ICD-10-CM

## 2019-04-28 DIAGNOSIS — B351 Tinea unguium: Secondary | ICD-10-CM | POA: Diagnosis not present

## 2019-04-28 DIAGNOSIS — M79675 Pain in left toe(s): Secondary | ICD-10-CM

## 2019-04-28 NOTE — Patient Instructions (Addendum)
Peripheral Vascular Disease Peripheral vascular disease (PVD) is a disease of the blood vessels. A simple term for PVD is poor circulation. In most cases, PVD narrows the blood vessels that carry blood from your heart to the rest of your body. This can result in a decreased supply of blood to your arms, legs, and internal organs, like your stomach or kidneys. However, it most often affects a person's lower legs and feet. There are two types of PVD.  Organic PVD. This is the more common type. It is caused by damage to the structure of blood vessels.  Functional PVD. This is caused by conditions that make blood vessels contract and tighten (spasm). Without treatment, PVD tends to get worse over time. PVD can also lead to acute limb ischemia. This is when an arm or leg suddenly has trouble getting enough blood. This is a medical emergency. What are the causes?  Each type of PVD has many different causes. The most common cause of PVD is buildup of a fatty material (plaque) inside your arteries (atherosclerosis). Small amounts of plaque can break off from the walls of the blood vessels and become lodged in a smaller artery. This blocks blood flow and can cause acute limb ischemia. Other common causes of PVD include:  Blood clots that form inside of blood vessels.  Injuries to blood vessels.  Diseases that cause inflammation of blood vessels or cause blood vessel spasms.  Health behaviors and health history that increase your risk of developing PVD. What increases the risk? You are more likely to develop this condition if:  You have a family history of PVD.  You have certain medical conditions, including: ? High cholesterol. ? Diabetes. ? High blood pressure (hypertension). ? Coronary heart disease. ? Past problems with blood clots. ? Past injury, such as burns or a broken bone. These may have damaged blood vessels in your limbs. ? Buerger disease. This is caused by inflamed blood vessels  in your hands and feet. ? Some forms of arthritis. ? Rare birth defects that affect the arteries in your legs. ? Kidney disease.  You use tobacco or smoke.  You do not get enough exercise.  You are obese.  You are age 50 or older. What are the signs or symptoms? This condition may cause different symptoms. Your symptoms depend on what part of your body is not getting enough blood. Some common signs and symptoms include:  Cramps in your lower legs. This may be a symptom of poor leg circulation (claudication).  Pain and weakness in your legs. This happens while you are physically active but goes away when you rest (intermittent claudication).  Leg pain when at rest.  Leg numbness, tingling, or weakness.  Coldness in a leg or foot, especially when compared with the other leg.  Skin or hair changes. These can include: ? Hair loss. ? Shiny skin. ? Pale or bluish skin. ? Thick toenails.  Inability to get or maintain an erection (erectile dysfunction).  Fatigue. People with PVD are more likely to develop ulcers and sores on their toes, feet, or legs. These may take longer than normal to heal. How is this diagnosed? This condition is diagnosed based on:  Your signs and symptoms.  A physical exam and your medical history.  Other tests to find out what is causing your PVD and to determine its severity. Tests may include: ? Blood pressure recordings from your arms and legs and measurements of the strength of your pulses (pulse volume   recordings). ? Imaging studies using sound waves to take pictures of the blood flow through your blood vessels (Doppler ultrasound). ? Injecting a dye into your blood vessels before having imaging studies using:  X-rays (angiogram or arteriogram).  Computer-generated X-rays (CT angiogram).  A powerful electromagnetic field and a computer (magnetic resonance angiogram or MRA). How is this treated? Treatment for PVD depends on the cause of your  condition and how severe your symptoms are. It also depends on your age. Underlying causes need to be treated and controlled. These include long-term (chronic) conditions, such as diabetes, high cholesterol, and high blood pressure. Treatment includes:  Lifestyle changes, such as: ? Quitting smoking. ? Exercising regularly. ? Following a low-fat, low-cholesterol diet.  Taking medicines, such as: ? Blood thinners to prevent blood clots. ? Medicines to improve blood flow. ? Medicines to improve your blood cholesterol levels.  Surgical procedures, such as: ? A procedure that uses an inflated balloon to open a blocked artery and improve blood flow (angioplasty). ? A procedure to put in a wire mesh tube to keep a blocked artery open (stent implant). ? Surgery to reroute blood flow around a blocked artery (peripheral bypass surgery). ? Surgery to remove dead tissue from an infected wound on the affected limb. ? Amputation. This is surgical removal of the affected limb. It may be necessary in cases of acute limb ischemia where there has been no improvement through medical or surgical treatments. Follow these instructions at home: Lifestyle  Do not use any products that contain nicotine or tobacco, such as cigarettes and e-cigarettes. If you need help quitting, ask your health care provider.  Lose weight if you are overweight, and maintain a healthy weight as discussed by your health care provider.  Eat a diet that is low in fat and cholesterol. If you need help, ask your health care provider.  Exercise regularly. Ask your health care provider to suggest some good activities for you. General instructions  Take over-the-counter and prescription medicines only as told by your health care provider.  Take good care of your feet: ? Wear comfortable shoes that fit well. ? Check your feet often for any cuts or sores.  Keep all follow-up visits as told by your health care provider. This is  important. Contact a health care provider if:  You have cramps in your legs while walking.  You have leg pain when you are at rest.  You have coldness in a leg or foot.  Your skin changes.  You have erectile dysfunction.  You have cuts or sores on your feet that are not healing. Get help right away if:  Your arm or leg turns cold, numb, and blue.  Your arms or legs become red, warm, swollen, painful, or numb.  You have chest pain or trouble breathing.  You suddenly have weakness in your face, arm, or leg.  You become very confused or lose the ability to speak.  You suddenly have a very bad headache or lose your vision. Summary  Peripheral vascular disease (PVD) is a disease of the blood vessels.  In most cases, PVD narrows the blood vessels that carry blood from your heart to the rest of your body.  PVD may cause different symptoms. Your symptoms depend on what part of your body is not getting enough blood.  Treatment for PVD depends on the cause of your condition and how severe your symptoms are. This information is not intended to replace advice given to you by your   health care provider. Make sure you discuss any questions you have with your health care provider. Document Revised: 02/14/2017 Document Reviewed: 04/11/2016 Elsevier Patient Education  [place]ume are small areas of thickened skin that occur on the top, sides, or tip of a toe. They contain a cone-shaped core with a point that can press on a nerve below. This causes pain.  Calluses are areas of thickened skin that can occur anywhere on the body, including the hands, fingers, palms, soles of the feet, and heels. Calluses are usually larger than corns. What are the causes? Corns and calluses are caused by rubbing (friction) or pressure, such as from shoes that are too tight or do not fit properly. What increases the risk? Corns are more likely to develop in people who have  misshapen toes (toe deformities), such as hammer toes. Calluses can occur with friction to any area of the skin. They are more likely to develop in people who:  Work with their hands.  Wear shoes that fit poorly, are too tight, or are high-heeled.  Have toe deformities. What are the signs or symptoms? Symptoms of a corn or callus include:  A hard growth on the skin.  Pain or tenderness under the skin.  Redness and swelling.  Increased discomfort while wearing tight-fitting shoes, if your feet are affected. If a corn or callus becomes infected, symptoms may include:  Redness and swelling that gets worse.  Pain.  Fluid, blood, or pus draining from the corn or callus. How is this diagnosed? Corns and calluses may be diagnosed based on your symptoms, your medical history, and a physical exam. How is this treated? Treatment for corns and calluses may include:  Removing the cause of the friction or pressure. This may involve: ? Changing your shoes. ? Wearing shoe inserts (orthotics) or other protective layers in your shoes, such as a corn pad. ? Wearing gloves.  Applying medicine to the skin (topical medicine) to help soften skin in the hardened, thickened areas.  Removing layers of dead skin with a file to reduce the size of the corn or callus.  Removing the corn or callus with a scalpel or laser.  Taking antibiotic medicines, if your corn or callus is infected.  Having surgery, if a toe deformity is the cause. Follow these instructions at home:   Take over-the-counter and prescription medicines only as told by your health care provider.  If you were prescribed an antibiotic, take it as told by your health care provider. Do not stop taking it even if your condition starts to improve.  Wear shoes that fit well. Avoid wearing high-heeled shoes and shoes that are too tight or too loose.  Wear any padding, protective layers, gloves, or orthotics as told by your health care  provider.  Soak your hands or feet and then use a file or pumice stone to soften your corn or callus. Do this as told by your health care provider.  Check your corn or callus every day for symptoms of infection. Contact a health care provider if you:  Notice that your symptoms do not improve with treatment.  Have redness or swelling that gets worse.  Notice that your corn or callus becomes painful.  Have fluid, blood, or pus coming from your corn or callus.  Have new symptoms. Summary  Corns are small areas of thickened skin that occur on the top, sides, or tip of a toe.  Calluses are areas of thickened  skin that can occur anywhere on the body, including the hands, fingers, palms, and soles of the feet. Calluses are usually larger than corns.  Corns and calluses are caused by rubbing (friction) or pressure, such as from shoes that are too tight or do not fit properly.  Treatment may include wearing any padding, protective layers, gloves, or orthotics as told by your health care provider. This information is not intended to replace advice given to you by your health care provider. Make sure you discuss any questions you have with your health care provider. Document Revised: 06/24/2018 Document Reviewed: 01/15/2017 Elsevier Patient Education  2020 [employer].

## 2019-04-30 NOTE — Progress Notes (Signed)
Subjective: Jeffrey Serrano presents today for follow up of corn(s) b/l feet and painful mycotic toenails b/l that are difficult to trim. Pain interferes with ambulation. Aggravating factors include wearing enclosed shoe gear. Pain is relieved with periodic professional debridement..   No Known Allergies   Objective: There were no vitals filed for this visit.  Vascular Examination:  CFT >4 seconds b/l, nonpalpable DP pulses b/l, non-palpable PT pulses b/l, pedal hair absent b/l and skin temperature gradient within normal limits b/l  Dermatological Examination: Pedal skin is thin shiny, atrophic bilaterally, no open wounds bilaterally, no interdigital macerations bilaterally, toenails 1-5 b/l elongated, dystrophic, thickened, crumbly with subungual debris and hyperkeratotic lesion(s) lateral nailfold b/l 5th digits and submet head 5 b/l.  No erythema, no edema, no drainage, no flocculence  Musculoskeletal: Normal muscle strength 5/5 to all lower extremity muscle groups bilaterally, no gross bony deformities bilaterally and no pain crepitus or joint limitation noted with ROM b/l  Neurological: Protective sensation intact 5/5 intact bilaterally with 10g monofilament b/l and vibratory sensation intact b/l  Assessment: 1. Pain due to onychomycosis of toenails of both feet   2. Corns and callosities   3. PAD (peripheral artery disease) (HCC)    Plan: -Toenails 1-5 b/l were debrided in length and girth without iatrogenic bleeding. -corn(s) debrided b/l 5th digit and submet head 5 b/l without complication or incident. Total number debrided=4 -Patient to continue soft, supportive shoe gear daily. -Patient to report any pedal injuries to medical professional immediately. -Patient/POA to call should there be question/concern in the interim.  Return in about 3 months (around 07/26/2019) for nail and callus trim.

## 2019-05-10 ENCOUNTER — Other Ambulatory Visit: Payer: Self-pay

## 2019-05-10 MED ORDER — LACOSAMIDE 150 MG PO TABS: 150.0000 mg | ORAL_TABLET | Freq: Two times a day (BID) | ORAL | 1 refills | Status: DC

## 2019-05-10 NOTE — Telephone Encounter (Signed)
I need a new RX printed please with Dr. Leonie Man information and Signature . [facility] patient assistance is rejecting because Dr. Felecia Shelling signed for Dr. Leonie Man. This is a new process for [facility] . I need a whole new R X for His vimpat . Thanks Hinton Dyer .

## 2019-05-10 NOTE — Telephone Encounter (Signed)
Printed vimpat signed by Dr. Leonie Man given to [facility] for patient assistance.

## 2019-05-14 ENCOUNTER — Telehealth: Payer: Self-pay | Admitting: Adult Health

## 2019-05-14 ENCOUNTER — Ambulatory Visit: Payer: PPO | Attending: Internal Medicine

## 2019-05-14 DIAGNOSIS — Z23 Encounter for immunization: Secondary | ICD-10-CM | POA: Insufficient documentation

## 2019-05-14 NOTE — Telephone Encounter (Signed)
[place],Maurice(brother on DPR) has called to as about additional Vimpat samples while they wait on government assistance with medications.  Please call  Tripathi,Maurice if and when he can come to office for samples

## 2019-05-14 NOTE — Progress Notes (Signed)
   Covid-19 Vaccination Clinic  Name:  Jeffrey Serrano    MRN: [id number] DOB: Jul 26, 1950  05/14/2019  Jeffrey Serrano was observed post Covid-19 immunization for 15 minutes without incidence. He was provided with Vaccine Information Sheet and instruction to access the V-Safe system.   Jeffrey Serrano was instructed to call 911 with any severe reactions post vaccine: [name] Difficulty breathing  . Swelling of your face and throat  . A fast heartbeat  . A bad rash all over your body  . Dizziness and weakness    Immunizations Administered    Name Date Dose VIS Date Route   Pfizer COVID-19 Vaccine 05/14/2019  9:12 AM 0.3 mL 02/26/2019 Intramuscular   Manufacturer: [place]   Lot: [id number]   [place]: [id number]

## 2019-05-17 ENCOUNTER — Other Ambulatory Visit: Payer: Self-pay | Admitting: *Deleted

## 2019-05-17 MED ORDER — VIMPAT 100 MG PO TABS: ORAL_TABLET | ORAL | 0 refills | Status: DC

## 2019-05-17 MED ORDER — LACOSAMIDE 50 MG PO TABS: ORAL_TABLET | ORAL | 0 refills | Status: DC

## 2019-05-17 NOTE — Telephone Encounter (Signed)
Jeffrey Serrano has pulled some Vimpat samples from the sample closet for the patient.

## 2019-05-17 NOTE — Telephone Encounter (Signed)
Pt and brother here for vimpat samples and speak to Rance Muir for PAP.  I gave 2 wks worh of samples  VIMPAT 100mg  tabs and 50mg  tabls.  Instructions to give one of each twice daily.  He [name], verbalized understanding.

## 2019-05-18 NOTE — Telephone Encounter (Signed)
Sandy RN Pulled samples and signed them out and gave them to the patient. I called [facility] and replayed that patient was out of his RX and he needed a refill sent ASAP . I relayed I sent in information for his renewal 03/15/2019. REP told me she would put back as Urgent and she would call me back on Wednesday .

## 2019-05-19 NOTE — Telephone Encounter (Signed)
Patient was approved for Vimpat and will be mailed to patient's home.

## 2019-05-28 DIAGNOSIS — G40909 Epilepsy, unspecified, not intractable, without status epilepticus: Secondary | ICD-10-CM | POA: Diagnosis not present

## 2019-05-28 DIAGNOSIS — B182 Chronic viral hepatitis C: Secondary | ICD-10-CM | POA: Diagnosis not present

## 2019-05-28 DIAGNOSIS — E663 Overweight: Secondary | ICD-10-CM | POA: Diagnosis not present

## 2019-05-28 DIAGNOSIS — N183 Chronic kidney disease, stage 3 unspecified: Secondary | ICD-10-CM | POA: Diagnosis not present

## 2019-05-28 DIAGNOSIS — I129 Hypertensive chronic kidney disease with stage 1 through stage 4 chronic kidney disease, or unspecified chronic kidney disease: Secondary | ICD-10-CM | POA: Diagnosis not present

## 2019-05-28 DIAGNOSIS — Z0000 Encounter for general adult medical examination without abnormal findings: Secondary | ICD-10-CM | POA: Diagnosis not present

## 2019-05-28 DIAGNOSIS — Z008 Encounter for other general examination: Secondary | ICD-10-CM | POA: Diagnosis not present

## 2019-06-08 ENCOUNTER — Ambulatory Visit: Payer: PPO | Attending: Internal Medicine

## 2019-06-08 DIAGNOSIS — Z23 Encounter for immunization: Secondary | ICD-10-CM

## 2019-06-08 NOTE — Progress Notes (Signed)
   Covid-19 Vaccination Clinic  Name:  Jeffrey Serrano    MRN: [id number] DOB: 07/13/1950  06/08/2019  [name] was observed post Covid-19 immunization for 15 minutes without incident. He was provided with Vaccine Information Sheet and instruction to access the V-Safe system.   [name] was instructed to call 911 with any severe reactions post vaccine: [name] Difficulty breathing  . Swelling of face and throat  . A fast heartbeat  . A bad rash all over body  . Dizziness and weakness   Immunizations Administered    Name Date Dose VIS Date Route   Pfizer COVID-19 Vaccine 06/08/2019 10:51 AM 0.3 mL 02/26/2019 Intramuscular   Manufacturer: [place]   Lot: [id number]   [place]: [id number]

## 2019-06-11 ENCOUNTER — Other Ambulatory Visit: Payer: Self-pay

## 2019-06-11 ENCOUNTER — Encounter: Payer: Self-pay | Admitting: Family

## 2019-06-11 ENCOUNTER — Ambulatory Visit (INDEPENDENT_AMBULATORY_CARE_PROVIDER_SITE_OTHER): Payer: PPO | Admitting: Family

## 2019-06-11 ENCOUNTER — Telehealth: Payer: Self-pay | Admitting: Pharmacy Technician

## 2019-06-11 VITALS — BP 124/83 | HR 77 | Temp 98.2°F | Ht 67.0 in | Wt 150.0 lb

## 2019-06-11 DIAGNOSIS — B182 Chronic viral hepatitis C: Secondary | ICD-10-CM | POA: Diagnosis not present

## 2019-06-11 NOTE — Patient Instructions (Signed)
Nice to meet you.  We will check your blood work today and let you know the results.  Plan for follow up pending starting of medication.  Limit acetaminophen (Tylenol) usage to no more than 2 grams (2,000 mg) per day.  Avoid alcohol.  Do not share toothbrushes or razors.  Practice safe sex to protect against transmission as well as sexually transmitted disease.    Hepatitis C Hepatitis C is a viral infection of the liver. It can lead to scarring of the liver (cirrhosis), liver failure, or liver cancer. Hepatitis C may go undetected for months or years because people with the infection may not have symptoms, or they may have only mild symptoms. What are the causes? This condition is caused by the hepatitis C virus (HCV). The virus can spread from person to person (is contagious) through:  Blood.  Childbirth. A woman who has hepatitis C can pass it to her baby during birth.  Bodily fluids, such as breast milk, tears, semen, vaginal fluids, and saliva.  Blood transfusions or organ transplants done in the [place] before 1992.  What increases the risk? The following factors may make you more likely to develop this condition:  Having contact with unclean (contaminated) needles or syringes. This may result from: ? Acupuncture. ? Tattoing. ? Body piercing. ? Injecting drugs.  Having unprotected sex with someone who is infected.  Needing treatment to filter your blood (kidney dialysis).  Having HIV (human immunodeficiency virus) or AIDS (acquired immunodeficiency syndrome).  Working in a job that involves contact with blood or bodily fluids, such as health care.  What are the signs or symptoms? Symptoms of this condition include:  Fatigue.  Loss of appetite.  Nausea.  Vomiting.  Abdominal pain.  Dark yellow urine.  Yellowish skin and eyes (jaundice).  Itchy skin.  Clay-colored bowel movements.  Joint pain.  Bleeding and bruising easily.  Fluid  building up in your stomach (ascites).  In some cases, you may not have any symptoms. How is this diagnosed? This condition is diagnosed with:  Blood tests.  Other tests to check how well your liver is functioning. They may include: ? Magnetic resonance elastography (MRE). This imaging test uses MRIs and sound waves to measure liver stiffness. ? Transient elastography. This imaging test uses ultrasounds to measure liver stiffness. ? Liver biopsy. This test requires taking a small tissue sample from your liver to examine it under a microscope.  How is this treated? Your health care provider may perform noninvasive tests or a liver biopsy to help decide the best course of treatment. Treatment may include:  Antiviral medicines and other medicines.  Follow-up treatments every 6-12 months for infections or other liver conditions.  Receiving a donated liver (liver transplant).  Follow these instructions at home: Medicines  Take over-the-counter and prescription medicines only as told by your health care provider.  Take your antiviral medicine as told by your health care provider. Do not stop taking the antiviral even if you start to feel better.  Do not take any medicines unless approved by your health care provider, including over-the-counter medicines and birth control pills. Activity  Rest as needed.  Do not have sex unless approved by your health care provider.  Ask your health care provider when you may return to school or work. Eating and drinking  Eat a balanced diet with plenty of fruits and vegetables, whole grains, and lowfat (lean) meats or non-meat proteins (such as beans or tofu).  Drink enough fluids  to keep your urine clear or pale yellow.  Do not drink alcohol. General instructions  Do not share toothbrushes, nail clippers, or razors.  Wash your hands frequently with soap and water. If soap and water are not available, use hand sanitizer.  Cover any cuts or  open sores on your skin to prevent spreading the virus.  Keep all follow-up visits as told by your health care provider. This is important. You may need follow-up visits every 6-12 months. How is this prevented? There is no vaccine for hepatitis C. The only way to prevent the disease is to reduce the risk of exposure to the virus. Make sure you:  Wash your hands frequently with soap and water. If soap and water are not available, use hand sanitizer.  Do not share needles or syringes.  Practice safe sex and use condoms.  Avoid handling blood or bodily fluids without gloves or other protection.  Avoid getting tattoos or piercings in shops or other locations that are not clean.  Contact a health care provider if:  You have a fever.  You develop abdominal pain.  You pass dark urine.  You pass clay-colored stools.  You develop joint pain. Get help right away if:  You have increasing fatigue or weakness.  You lose your appetite.  You cannot eat or drink without vomiting.  You develop jaundice or your jaundice gets worse.  You bruise or bleed easily. Summary  Hepatitis C is a viral infection of the liver. It can lead to scarring of the liver (cirrhosis), liver failure, or liver cancer.  The hepatitis C virus (HCV) causes this condition. The virus can pass from person to person (is contagious).  You should not take any medicines unless approved by your health care provider. This includes over-the-counter medicines and birth control pills. This information is not intended to replace advice given to you by your health care provider. Make sure you discuss any questions you have with your health care provider. Document Released: 03/01/2000 Document Revised: 04/09/2016 Document Reviewed: 04/09/2016 Elsevier Interactive Patient Education  [employer].

## 2019-06-11 NOTE — Telephone Encounter (Signed)
[facility] Patient Advocate Encounter    Findings of the benefits investigation:   Insurance: Teacher, early years/pre (active medicare)  Prior Authorization: will begin insurance process once medication is prescribed  [facility] Patient Advocate will follow up once patient arrives for their appointment. Patient previously was given Mavyret previously but never started the medication. We will begin the process once labs return.   Jeffrey Serrano, CPhT Specialty Pharmacy Patient [facility] for Infectious Disease Phone: [phone] Fax: [phone] 06/11/2019 4:10 PM

## 2019-06-11 NOTE — Progress Notes (Signed)
Subjective:    Patient ID: Jeffrey Serrano, male    DOB: January 06, 1951, 69 y.o.   MRN: [id number]  Chief Complaint  Patient presents with  . Hepatitis C    HPI:  Jeffrey Serrano is a 69 y.o. male who was previously seen in our office for Chronic Hepatitis C by Dr. Linus Salmons on 08/07/16. Blood work completed at that time showed a viral load of 1.45 million with Genotype 1b and fibrosis score of F4. Elastography performed showed F2-F3. It was planned for treatment of Mavyret, however medication was never started. Jeffrey Serrano returns today for follow up and treatment of Hepatitis C.   Jeffrey Serrano has been doing well since his last office visit and remains asymptomatic at present and denies nausea, vomiting, fatigue, scleral icterus, jaundice, or abdominal pain. He has not been treated for Hepatitis C since his last office visit and is unclear why medication was not started.  Primary risk factor for acquiring Hepatitis C appears to be age with no previous history of IVDU, blood transfusions prior to 1992, sharing of razors/toothbrushes, tattoos, or sexual contact with known positive partner. No personal or family history of liver disease. No current use of recreational/illicit drug use, tobacco or alcohol.  He remains on Vimpat for seizures.   No Known Allergies   Outpatient Medications Prior to Visit  Medication Sig Dispense Refill  . Cholecalciferol (VITAMIN D3) 1000 units CAPS Take by mouth.    . gabapentin (NEURONTIN) 100 MG capsule TAKE 1 CAPSULE BY MOUTH ONCE DAILY IF NEEDED 30 capsule 0  . hydrochlorothiazide (HYDRODIURIL) 12.5 MG tablet TAKE 1 TABLET BY MOUTH EVERY DAY 90 tablet 0  . HYDROcodone-acetaminophen (NORCO/VICODIN) 5-325 MG tablet Take 1 tablet by mouth every 6 (six) hours as needed for moderate pain. 10 tablet 0  . ibuprofen (ADVIL,MOTRIN) 200 MG tablet Take 200 mg by mouth every 6 (six) hours as needed.    . Lacosamide 150 MG TABS Take 1 tablet (150 mg total) by mouth 2 (two) times  daily. 180 tablet 1  . lisinopril (ZESTRIL) 10 MG tablet TAKE 1 TABLET BY MOUTH EVERY DAY 90 tablet 0  . triamcinolone cream (KENALOG) 0.1 % APPLY TO AFFECTED AREA TWICE A DAY 30 g 0  . Lacosamide (VIMPAT) 100 MG TABS Take 1 tablet (100 mg total) by mouth 2 (two) times daily. (Patient not taking: Reported on 06/11/2019) 56 tablet 0  . Lacosamide (VIMPAT) 100 MG TABS SAMPLE 2  Boxes (#28) LOT [id number] 04/2021 (Patient not taking: Reported on 06/11/2019) 28 tablet 0  . lacosamide (VIMPAT) 50 MG TABS tablet Take 1 tablet (50 mg total) by mouth 2 (two) times daily. (Patient not taking: Reported on 06/11/2019) 56 tablet 0  . lacosamide (VIMPAT) 50 MG TABS tablet Sample 2 Boxes (#28)  Take one tablet BID with 100mg  po BID (TOTAL) 150mg  po BID.  LOT [id number]  09/2022. (Patient not taking: Reported on 06/11/2019) 28 tablet 0   No facility-administered medications prior to visit.     Past Medical History:  Diagnosis Date  . Hepatitis C   . Hypertension   . PVD (peripheral vascular disease) ([place])   . [place]    most recent Jan 2020      History reviewed. No pertinent surgical history.    Family History  Problem Relation Age of Onset  . Seizures Father      Social History   Socioeconomic History  . Marital status: Legally Separated    Spouse name:  Not on file  . Number of children: Not on file  . Years of education: Not on file  . 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 Smoking status: Former Smoker    Quit date: 03/26/1988    Years since quitting: 31.2  . Smokeless tobacco: Never Used  Substance and Sexual Activity  . Alcohol use: No  . Drug use: No  . Sexual activity: Not on file  Other Topics Concern  . Not on file  Social History Narrative   04/13/18 living with brother, [name]   Social Determinants of Health   Financial Resource Strain:   . Difficulty of Paying Living Expenses:   Food Insecurity:   .  Worried About Charity fundraiser in the Last Year:   . Arboriculturist in the Last Year:   Transportation Needs:   . Film/video editor (Medical):   [name] Lack of Transportation (Non-Medical):   Physical Activity:   . Days of Exercise per Week:   . Minutes of Exercise per Session:   Stress:   . Feeling of Stress :   Social Connections:   . Frequency of Communication with Friends and Family:   . Frequency of Social Gatherings with Friends and Family:   . Attends Religious Services:   . Active Member of Clubs or Organizations:   . Attends Archivist Meetings:   [name] Marital Status:   Intimate Partner Violence:   . Fear of Current or Ex-Partner:   . Emotionally Abused:   [name] Physically Abused:   . Sexually Abused:       Review of Systems  Constitutional: Negative for chills, diaphoresis, fatigue and fever.  Respiratory: Negative for cough, chest tightness, shortness of breath and wheezing.   Cardiovascular: Negative for chest pain.  Gastrointestinal: Negative for abdominal distention, abdominal pain, constipation, diarrhea, nausea and vomiting.  Neurological: Negative for weakness and headaches.  Hematological: Does not bruise/bleed easily.       Objective:    BP 124/83   Pulse 77   Temp 98.2 F (36.8 C) (Oral)   Ht 5\' 7"  (1.702 m)   Wt 150 lb (68 kg)   SpO2 98%   BMI 23.49 kg/m  Nursing note and vital signs reviewed.  Physical Exam Constitutional:      General: He is not in acute distress.    Appearance: He is well-developed.     Comments: Seated in the chair; pleasant; slow to respond to questions at times.   Cardiovascular:     Rate and Rhythm: Normal rate and regular rhythm.     Heart sounds: Normal heart sounds. No murmur. No friction rub. No gallop.   Pulmonary:     Effort: Pulmonary effort is normal. No respiratory distress.     Breath sounds: Normal breath sounds. No wheezing or rales.  Chest:     Chest wall: No tenderness.  Abdominal:      General: Bowel sounds are normal. There is no distension.     Palpations: Abdomen is soft. There is no mass.     Tenderness: There is no abdominal tenderness. There is no guarding or rebound.  Skin:    General: Skin is warm and dry.  Neurological:     Mental Status: He is alert and oriented to person, place, and time.  Psychiatric:        Mood and Affect: Mood normal.  Assessment & Plan:   Patient Active Problem List   Diagnosis Date Noted  . Chronic hepatitis C without hepatic coma ([place]) 08/07/2016  . Seizure disorder ([place]) 05/22/2016  . DEPRESSION 09/10/2006  . Essential hypertension 09/10/2006  . PERIPHERAL VASCULAR DISEASE 09/10/2006  . Seizures ([place]) 09/10/2006     Problem List Items Addressed This Visit      Digestive   Chronic hepatitis C without hepatic coma ([place]) - Primary    Jeffrey Serrano is a 69 y/o male with Genotype 1b Chronic Hepatitis C with initial viral load of 1.45 million and Fibrosis score of F4 with elastography showing F2-F3. He remains treatment naive and is asymptomatic. We reviewed his previous lab work and discussed the plan of care including treatments for Hepatitis C. Unclear why he was not treated in the past. Will update lab work with genotype, Hepatitis B status, HIV status, and fibrosis score. Plan for treatment with Mavyret as previously discussed pending blood work results.       Relevant Orders   Hepatitis B surface antibody,qualitative   Hepatitis B surface antigen   HIV antibody (with reflex)   CBC (Completed)   Hepatic function panel (Completed)   Liver Fibrosis, FibroTest-ActiTest   Hepatitis C RNA quantitative   Protime-INR (Completed)   Basic metabolic panel (Completed)       I have discontinued [facility]. [place] Vimpat and Vimpat. I am also having him maintain his Vitamin D3, ibuprofen, HYDROcodone-acetaminophen, lisinopril, hydrochlorothiazide, gabapentin, triamcinolone cream, and Lacosamide.   Follow-up: 1 month after  initiation of treatment.    Terri Piedra, MSN, FNP-C Nurse Practitioner [facility] for Infectious Disease [place] number: [phone]

## 2019-06-12 ENCOUNTER — Encounter: Payer: Self-pay | Admitting: Family

## 2019-06-12 NOTE — Assessment & Plan Note (Addendum)
Jeffrey Serrano is a 69 y/o male with Genotype 1b Chronic Hepatitis C with initial viral load of 1.45 million and Fibrosis score of F4 with elastography showing F2-F3. He remains treatment naive and is asymptomatic. We reviewed his previous lab work and discussed the plan of care including treatments for Hepatitis C. Unclear why he was not treated in the past. Will update lab work with genotype, Hepatitis B status, HIV status, and fibrosis score. Plan for treatment with Mavyret as previously discussed pending blood work results.

## 2019-06-17 LAB — HEPATIC FUNCTION PANEL
AG Ratio: 1.3 (calc) (ref 1.0–2.5)
ALT: 13 U/L (ref 9–46)
AST: 21 U/L (ref 10–35)
Albumin: 4 g/dL (ref 3.6–5.1)
Alkaline phosphatase (APISO): 66 U/L (ref 35–144)
Bilirubin, Direct: 0.3 mg/dL — ABNORMAL HIGH (ref 0.0–0.2)
Globulin: 3.1 g/dL (calc) (ref 1.9–3.7)
Indirect Bilirubin: 0.6 mg/dL (calc) (ref 0.2–1.2)
Total Bilirubin: 0.9 mg/dL (ref 0.2–1.2)
Total Protein: 7.1 g/dL (ref 6.1–8.1)

## 2019-06-17 LAB — CBC
HCT: 46.3 % (ref 38.5–50.0)
Hemoglobin: 16.1 g/dL (ref 13.2–17.1)
MCH: 32.1 pg (ref 27.0–33.0)
MCHC: 34.8 g/dL (ref 32.0–36.0)
MCV: 92.2 fL (ref 80.0–100.0)
MPV: 11.4 fL (ref 7.5–12.5)
Platelets: 201 10*3/uL (ref 140–400)
RBC: 5.02 10*6/uL (ref 4.20–5.80)
RDW: 12.1 % (ref 11.0–15.0)
WBC: 6.1 10*3/uL (ref 3.8–10.8)

## 2019-06-17 LAB — BASIC METABOLIC PANEL
BUN: 11 mg/dL (ref 7–25)
CO2: 24 mmol/L (ref 20–32)
Calcium: 9.6 mg/dL (ref 8.6–10.3)
Chloride: 100 mmol/L (ref 98–110)
Creat: 1.13 mg/dL (ref 0.70–1.18)
Glucose, Bld: 91 mg/dL (ref 65–99)
Potassium: 4.1 mmol/L (ref 3.5–5.3)
Sodium: 135 mmol/L (ref 135–146)

## 2019-06-17 LAB — LIVER FIBROSIS, FIBROTEST-ACTITEST
ALT: 14 U/L (ref 9–46)
Alpha-2-Macroglobulin: 427 mg/dL — ABNORMAL HIGH (ref 106–279)
Apolipoprotein A1: 139 mg/dL (ref 94–176)
Bilirubin: 1 mg/dL (ref 0.2–1.2)
Fibrosis Score: 0.84
GGT: 37 U/L (ref 3–70)
Haptoglobin: 98 mg/dL (ref 43–212)
Necroinflammat ACT Score: 0.1
Reference ID: 3331325

## 2019-06-17 LAB — PROTIME-INR
INR: 1.1
Prothrombin Time: 11.1 s (ref 9.0–11.5)

## 2019-06-17 LAB — HEPATITIS B SURFACE ANTIBODY,QUALITATIVE: Hep B S Ab: NONREACTIVE

## 2019-06-17 LAB — HEPATITIS C RNA QUANTITATIVE
HCV Quantitative Log: 6.46 Log IU/mL — ABNORMAL HIGH
HCV RNA, PCR, QN: 2890000 IU/mL — ABNORMAL HIGH

## 2019-06-17 LAB — HIV ANTIBODY (ROUTINE TESTING W REFLEX): HIV 1&2 Ab, 4th Generation: NONREACTIVE

## 2019-06-17 LAB — HEPATITIS B SURFACE ANTIGEN: Hepatitis B Surface Ag: NONREACTIVE

## 2019-06-21 ENCOUNTER — Telehealth: Payer: Self-pay | Admitting: Family

## 2019-06-21 DIAGNOSIS — B182 Chronic viral hepatitis C: Secondary | ICD-10-CM

## 2019-06-21 MED ORDER — MAVYRET 100-40 MG PO TABS: 3.0000 | ORAL_TABLET | Freq: Every day | ORAL | 1 refills | Status: DC

## 2019-06-21 NOTE — Telephone Encounter (Signed)
Jeffrey Serrano has Genotype 1b Chronic Hepatitis C with initial viral load of 2.890 million and Fibrosis score of F4 and corresponding APRI of 0.261 and FIB4 of 2.03. There appears to be fibrosis, but with this discordance in numbers will refer for evaluation of fibrosis. Plan for 8 weeks of Mavyret.

## 2019-06-22 NOTE — Telephone Encounter (Signed)
Will begin insurance process and alert the patient once we hear determination from [employer].

## 2019-06-24 ENCOUNTER — Telehealth: Payer: Self-pay | Admitting: Pharmacy Technician

## 2019-06-24 NOTE — Telephone Encounter (Signed)
[facility] Patient Advocate Encounter  Prior Authorization for Mavyret has been approved.    Effective dates: 06/24/2019 through 08/19/2019  Patients co-pay is $1385.00 an evoucher has been attached will try and process with grant assistance instead to bring down that amount.   [facility] will continue to follow.  Bartholomew Crews, CPhT Specialty Pharmacy Patient [facility] for Infectious Disease Phone: [phone] Fax: [phone] 06/24/2019 11:58 AM

## 2019-06-24 NOTE — Telephone Encounter (Addendum)
[facility] Patient Advocate Encounter   Received notification from [facility] that prior authorization for [place] is required.   PA submitted on 06/24/2019 Key [id number] Status is pending [employer] prompt pa portal, approved  ID: [id number] EOC: [id number]    [facility] Clinic will continue to follow.  Bartholomew Crews, CPhT Specialty Pharmacy Patient [facility] for Infectious Disease Phone: [phone] Fax: [phone] 06/24/2019 11:43 AM

## 2019-06-25 NOTE — Telephone Encounter (Signed)
Patient has been approved to receive Mavyret for 8 weeks until 08/19/2019. Will need to coordinate his medication delivery or pick up.

## 2019-06-29 ENCOUNTER — Encounter: Payer: Self-pay | Admitting: Pharmacy Technician

## 2019-06-29 ENCOUNTER — Telehealth: Payer: Self-pay

## 2019-06-29 NOTE — Telephone Encounter (Signed)
I spoke with Jeffrey Serrano's brother this afternoon after Tallie's new medication Mavyret. I instructed him to take Mavyret 3 tablets daily with food at the same time every day for a total of 8 weeks. I stressed the importance of adherence to this medication to give him the best chance at achieving clinical cure. His brother is confident that he will be able to assist Emon in staying on track with his HCV medication regimen. We discussed the most common adverse effects of headache and fatigue, and asked him to contact the clinic if he has any issues tolerating this new medication. I also advised him to reach out if Zailyn is started on any new medications so that we can evaluate for drug interactions. All of his questions and concerns were addressed at this time. Adonis Brook is following the patient to schedule pickup and a follow-up appointment.   Agnes Lawrence, PharmD PGY1 Pharmacy Resident

## 2019-06-29 NOTE — Telephone Encounter (Signed)
[facility] Patient Advocate Encounter   I was successful in securing patient a $30,000 grant from [employer] (PAF) to provide copayment coverage for [place]. This will make the out of pocket cost $0.     I have spoken with the patient and he will pick up tomorrow at [facility].    The billing information is.   RxBin: [id number] PCN: [id number] Member ID: [id number] Group ID: [id number] Dates of Eligibility: 05/30/2019 through 05/28/2020  Patient knows to call the office with questions or concerns.  Bartholomew Crews, CPhT Specialty Pharmacy Patient [facility] for Infectious Disease Phone: [phone] Fax: [phone] 06/29/2019 2:19 PM

## 2019-06-30 MED FILL — MAVYRET 100-40 MG TABS: 100-40 | 28 days supply | Qty: 84 | Fill #0

## 2019-07-09 ENCOUNTER — Encounter: Payer: Self-pay | Admitting: Family Medicine

## 2019-07-09 ENCOUNTER — Ambulatory Visit (INDEPENDENT_AMBULATORY_CARE_PROVIDER_SITE_OTHER): Payer: PPO | Admitting: Family Medicine

## 2019-07-09 ENCOUNTER — Other Ambulatory Visit: Payer: Self-pay

## 2019-07-09 VITALS — BP 116/79 | HR 71 | Temp 98.5°F

## 2019-07-09 DIAGNOSIS — R569 Unspecified convulsions: Secondary | ICD-10-CM | POA: Diagnosis not present

## 2019-07-09 DIAGNOSIS — B182 Chronic viral hepatitis C: Secondary | ICD-10-CM

## 2019-07-09 DIAGNOSIS — I10 Essential (primary) hypertension: Secondary | ICD-10-CM

## 2019-07-09 MED ORDER — HYDROCHLOROTHIAZIDE 12.5 MG PO TABS: 12.5000 mg | ORAL_TABLET | Freq: Every day | ORAL | 2 refills | Status: DC

## 2019-07-09 MED ORDER — LISINOPRIL 10 MG PO TABS: 10.0000 mg | ORAL_TABLET | Freq: Every day | ORAL | 2 refills | Status: DC

## 2019-07-09 NOTE — Progress Notes (Signed)
Subjective:  Patient ID: Jeffrey Serrano, male    DOB: 08-May-1950  Age: 69 y.o. MRN: [id number]  CC:  Chief Complaint  Patient presents with  . Follow-up    BP    HPI Jeffrey Serrano presents for   Hypertension: Hydrochlorothiazide 12.5 mg daily, lisinopril 10 mg daily. Home readings: controlled. No new side effects with meds. Recent renal function ok.  BP Readings from Last 3 Encounters:  07/09/19 116/79  06/11/19 124/83  04/15/19 126/77   Lab Results  Component Value Date   CREATININE 1.13 06/11/2019   Lab Results  Component Value Date   CHOL 152 06/26/2018   HDL 46 06/26/2018   LDLCALC 94 06/26/2018   TRIG 59 06/26/2018   CHOLHDL 3.3 06/26/2018    Chronic hepatitis C Followed by infectious disease, appointment March 26.  Plan for treatment with [facility].   History of seizure disorder: Neurology appointment in January, was continued on Vimpat 150 mg twice daily.  58-month follow-up.   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  Work is going ok.    History Patient Active Problem List   Diagnosis Date Noted  . Chronic hepatitis C without hepatic coma ([place]) 08/07/2016  . Seizure disorder ([place]) 05/22/2016  . DEPRESSION 09/10/2006  . Essential hypertension 09/10/2006  . PERIPHERAL VASCULAR DISEASE 09/10/2006  . Seizures ([place]) 09/10/2006   Past Medical History:  Diagnosis Date  . Hepatitis C   . Hypertension   . PVD (peripheral vascular disease) ([place])   . Seizures ([place])    most recent Jan 2020   History reviewed. No pertinent surgical history. No Known Allergies Prior to Admission medications   Medication Sig Start Date End Date Taking? Authorizing Provider  Cholecalciferol (VITAMIN D3) 1000 units CAPS Take by mouth.   Yes [provider]  gabapentin (NEURONTIN) 100 MG capsule TAKE 1 CAPSULE BY MOUTH ONCE DAILY IF NEEDED 02/08/19  Yes Wendie Agreste, MD    Glecaprevir-Pibrentasvir (MAVYRET) 100-40 MG TABS Take 3 tablets by mouth daily. Take 3 tablets by mouth daily with a meal. 06/21/19  Yes Golden Circle, FNP  hydrochlorothiazide (HYDRODIURIL) 12.5 MG tablet TAKE 1 TABLET BY MOUTH EVERY DAY 12/02/18  Yes Wendie Agreste, MD  HYDROcodone-acetaminophen (NORCO/VICODIN) 5-325 MG tablet Take 1 tablet by mouth every 6 (six) hours as needed for moderate pain. 06/29/18  Yes Wendie Agreste, MD  ibuprofen (ADVIL,MOTRIN) 200 MG tablet Take 200 mg by mouth every 6 (six) hours as needed.   Yes [provider]  Lacosamide 150 MG TABS Take 1 tablet (150 mg total) by mouth 2 (two) times daily. 05/10/19  Yes Garvin Fila, MD  lisinopril (ZESTRIL) 10 MG tablet TAKE 1 TABLET BY MOUTH EVERY DAY 12/02/18  Yes Wendie Agreste, MD  triamcinolone cream (KENALOG) 0.1 % APPLY TO AFFECTED AREA TWICE A DAY 03/15/19  Yes Wendie Agreste, MD   Social History   Socioeconomic History  . Marital status: Legally Separated    Spouse name: Not on file  . Number of children: Not on file  . Years of education: Not on file  . 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 Smoking status: Former Smoker    Quit date: 03/26/1988    Years since quitting: 31.3  . Smokeless tobacco: Never Used  Substance and Sexual Activity  .  Alcohol use: No  . Drug use: No  . Sexual activity: Not on file  Other Topics Concern  . Not on file  Social History Narrative   04/13/18 living with brother, [name]   Social Determinants of Health   Financial Resource Strain:   . Difficulty of Paying Living Expenses:   Food Insecurity:   . Worried About Charity fundraiser in the Last Year:   . Arboriculturist in the Last Year:   Transportation Needs:   . Film/video editor (Medical):   [name] Lack of Transportation (Non-Medical):   Physical Activity:   . Days of Exercise per Week:   . Minutes of Exercise per Session:    Stress:   . Feeling of Stress :   Social Connections:   . Frequency of Communication with Friends and Family:   . Frequency of Social Gatherings with Friends and Family:   . Attends Religious Services:   . Active Member of Clubs or Organizations:   . Attends Archivist Meetings:   [name] Marital Status:   Intimate Partner Violence:   . Fear of Current or Ex-Partner:   . Emotionally Abused:   [name] Physically Abused:   . Sexually Abused:     Review of Systems  Constitutional: Negative for fatigue and unexpected weight change.  Eyes: Negative for visual disturbance.  Respiratory: Negative for cough, chest tightness and shortness of breath.   Cardiovascular: Negative for chest pain, palpitations and leg swelling.  Gastrointestinal: Negative for abdominal pain and blood in stool.  Neurological: Negative for dizziness, light-headedness and headaches.     Objective:   Vitals:   07/09/19 1405  BP: 116/79  Pulse: 71  Temp: 98.5 F (36.9 C)  TempSrc: Temporal  SpO2: 98%    Physical Exam Vitals reviewed.  Constitutional:      Appearance: He is well-developed.  HENT:     Head: Normocephalic and atraumatic.  Eyes:     Pupils: Pupils are equal, round, and reactive to light.  Neck:     Vascular: No carotid bruit or JVD.  Cardiovascular:     Rate and Rhythm: Normal rate and regular rhythm.     Heart sounds: Normal heart sounds. No murmur.  Pulmonary:     Effort: Pulmonary effort is normal.     Breath sounds: Normal breath sounds. No rales.  Skin:    General: Skin is warm and dry.  Neurological:     Mental Status: He is alert and oriented to person, place, and time.     Assessment & Plan:  Jeffrey Serrano is a 69 y.o. male . Chronic hepatitis C without hepatic coma (HCC)  -Treated and followed by infectious disease.  Continue plan follow-up.  Essential hypertension - Plan: hydrochlorothiazide (HYDRODIURIL) 12.5 MG tablet, lisinopril (ZESTRIL) 10 MG  tablet  -Stable, continue lisinopril, HCTZ same doses.  Recent blood work reviewed with normal creatinine.  Prior lipid panel overall looked okay.  Seizures ([place])  -Has ongoing follow-up with neurology.  Continue same.   No orders of the defined types were placed in this encounter.  Patient Instructions       If you have lab work done today you will be contacted with your lab results within the next 2 weeks.  If you have not heard from [place] then please contact us. The fastest way to get your results is to register for My Chart.   IF you received an x-ray today, you will receive an invoice  from [facility]. Please contact [facility] at [phone] with questions or concerns regarding your invoice.   IF you received labwork today, you will receive an invoice from [facility]. Please contact [facility] at [phone] with questions or concerns regarding your invoice.   Our billing staff will not be able to assist you with questions regarding bills from these companies.  You will be contacted with the lab results as soon as they are available. The fastest way to get your results is to activate your My Chart account. Instructions are located on the last page of this paperwork. If you have not heard from [place] regarding the results in 2 weeks, please contact this office.         Signed, [name], MD [facility]

## 2019-07-09 NOTE — Patient Instructions (Addendum)
  No med changes. Follow up in 6 months, but let me know if there are questions sooner.    If you have lab work done today you will be contacted with your lab results within the next 2 weeks.  If you have not heard from [place] then please contact us. The fastest way to get your results is to register for My Chart.   IF you received an x-ray today, you will receive an invoice from [facility]. Please contact [facility] at [phone] with questions or concerns regarding your invoice.   IF you received labwork today, you will receive an invoice from [facility]. Please contact [facility] at [phone] with questions or concerns regarding your invoice.   Our billing staff will not be able to assist you with questions regarding bills from these companies.  You will be contacted with the lab results as soon as they are available. The fastest way to get your results is to activate your My Chart account. Instructions are located on the last page of this paperwork. If you have not heard from [place] regarding the results in 2 weeks, please contact this office.

## 2019-07-23 MED FILL — MAVYRET 100-40 MG TABS: 100-40 | 28 days supply | Qty: 84 | Fill #1

## 2019-07-27 ENCOUNTER — Telehealth: Payer: Self-pay | Admitting: *Deleted

## 2019-07-27 NOTE — Telephone Encounter (Signed)
Schedule AWV.  

## 2019-07-28 ENCOUNTER — Other Ambulatory Visit: Payer: Self-pay

## 2019-07-28 ENCOUNTER — Ambulatory Visit: Payer: PPO | Admitting: Podiatry

## 2019-07-28 ENCOUNTER — Encounter: Payer: Self-pay | Admitting: Podiatry

## 2019-07-28 DIAGNOSIS — I739 Peripheral vascular disease, unspecified: Secondary | ICD-10-CM

## 2019-07-28 DIAGNOSIS — M79675 Pain in left toe(s): Secondary | ICD-10-CM

## 2019-07-28 DIAGNOSIS — M79674 Pain in right toe(s): Secondary | ICD-10-CM | POA: Diagnosis not present

## 2019-07-28 DIAGNOSIS — L84 Corns and callosities: Secondary | ICD-10-CM | POA: Diagnosis not present

## 2019-07-28 DIAGNOSIS — B351 Tinea unguium: Secondary | ICD-10-CM | POA: Diagnosis not present

## 2019-07-28 NOTE — Telephone Encounter (Signed)
Please reschedule to an appropriate time.

## 2019-07-28 NOTE — Telephone Encounter (Signed)
Pt called Back for Resch for [place] please called pt back Advice

## 2019-07-28 NOTE — Patient Instructions (Signed)

## 2019-08-02 ENCOUNTER — Ambulatory Visit: Payer: PPO | Admitting: Pharmacist

## 2019-08-06 NOTE — Progress Notes (Addendum)
Subjective: Jeffrey Serrano is a 69 y.o. male patient seen today for at risk foot care. Patient has h/o PAD and corn(s) b/l 5th digits and callus(es) b/l feet and painful mycotic nails b/l.  Pain interferes with ambulation. Aggravating factors include wearing enclosed shoe gear.   He voices no new pedal concerns on today's visit. Relates no claudication/rest pain.  He also takes gabapentin for neuropathic pain.  Patient Active Problem List   Diagnosis Date Noted  . Chronic hepatitis C without hepatic coma ([place]) 08/07/2016  . Seizure disorder ([place]) 05/22/2016  . DEPRESSION 09/10/2006  . Essential hypertension 09/10/2006  . PERIPHERAL VASCULAR DISEASE 09/10/2006  . Seizures ([place]) 09/10/2006    Current Outpatient Medications on File Prior to Visit  Medication Sig Dispense Refill  . Cholecalciferol (VITAMIN D3) 1000 units CAPS Take by mouth.    . gabapentin (NEURONTIN) 100 MG capsule TAKE 1 CAPSULE BY MOUTH ONCE DAILY IF NEEDED 30 capsule 0  . Glecaprevir-Pibrentasvir (MAVYRET) 100-40 MG TABS Take 3 tablets by mouth daily. Take 3 tablets by mouth daily with a meal. 84 tablet 1  . hydrochlorothiazide (HYDRODIURIL) 12.5 MG tablet Take 1 tablet (12.5 mg total) by mouth daily. 90 tablet 2  . HYDROcodone-acetaminophen (NORCO/VICODIN) 5-325 MG tablet Take 1 tablet by mouth every 6 (six) hours as needed for moderate pain. 10 tablet 0  . ibuprofen (ADVIL,MOTRIN) 200 MG tablet Take 200 mg by mouth every 6 (six) hours as needed.    . Lacosamide 150 MG TABS Take 1 tablet (150 mg total) by mouth 2 (two) times daily. 180 tablet 1  . lisinopril (ZESTRIL) 10 MG tablet Take 1 tablet (10 mg total) by mouth daily. 90 tablet 2  . triamcinolone cream (KENALOG) 0.1 % APPLY TO AFFECTED AREA TWICE A DAY 30 g 0   No current facility-administered medications on file prior to visit.    No Known Allergies  Objective: Physical Exam  General: Jeffrey Serrano is a pleasant 69 y.o.  AA male, WD, WN in NAD. AAO x 3.    Vascular:  Capillary refill time to digits <4 seconds b/l. Nonpalpable DP pulses b/l. Nonpalpable PT pulses b/l. Pedal hair absent b/l Skin temperature gradient within normal limits b/l. No edema noted b/l.  Dermatological:  Pedal skin is thin shiny, atrophic bilaterally. No open wounds bilaterally. No interdigital macerations bilaterally. Toenails 1-5 b/l elongated, dystrophic, thickened, crumbly with subungual debris and tenderness to dorsal palpation. Hyperkeratotic lesion(s) lateral nailfold 5th digits b/l, submet head 5 left foot and submet head 5 right foot.  No erythema, no edema, no drainage, no flocculence.  Musculoskeletal:  Normal muscle strength 5/5 to all lower extremity muscle groups bilaterally. No pain crepitus or joint limitation noted with ROM b/l. Adductovarus deformity b/l 5th digits.  Neurological:  Protective sensation intact 5/5 intact bilaterally with 10g monofilament b/l. Vibratory sensation intact b/l.  Assessment and Plan:  1. Pain due to onychomycosis of toenails of both feet   2. Corns and callosities   3. PAD (peripheral artery disease) ([place])    -Examined patient. -No new findings. No new orders. -Toenails 1-5 b/l were debrided in length and girth with sterile nail nippers and dremel without iatrogenic bleeding.  -Corn(s) L 5th toe and R 5th toe pared utilizing sterile scalpel blade without complication or incident. Total number debrided=2. -Patient to continue soft, supportive shoe gear daily. -Patient to report any pedal injuries to medical professional immediately. -Patient/POA to call should there be question/concern in the interim.  Return in about 3 months (around 10/28/2019) for nail and callus trim.  Marzetta Board, DPM

## 2019-08-09 ENCOUNTER — Other Ambulatory Visit: Payer: Self-pay

## 2019-08-09 ENCOUNTER — Ambulatory Visit (INDEPENDENT_AMBULATORY_CARE_PROVIDER_SITE_OTHER): Payer: PPO | Admitting: Pharmacist

## 2019-08-09 DIAGNOSIS — B182 Chronic viral hepatitis C: Secondary | ICD-10-CM

## 2019-08-09 NOTE — Progress Notes (Signed)
HPI: Jeffrey Serrano is a 69 y.o. male who presents to the [facility] clinic for Hepatitis C follow-up.  Medication: Mavyret x 8 weeks  Start Date: 4/14  Hepatitis C Genotype: 1b  Fibrosis Score: F4  Hepatitis C RNA: 2.89 million on 3/26  Patient Active Problem List   Diagnosis Date Noted  . Chronic hepatitis C without hepatic coma ([place]) 08/07/2016  . Seizure disorder ([place]) 05/22/2016  . DEPRESSION 09/10/2006  . Essential hypertension 09/10/2006  . PERIPHERAL VASCULAR DISEASE 09/10/2006  . Seizures ([place]) 09/10/2006    Patient's Medications  New Prescriptions   No medications on file  Previous Medications   CHOLECALCIFEROL (VITAMIN D3) 1000 UNITS CAPS    Take by mouth.   GABAPENTIN (NEURONTIN) 100 MG CAPSULE    TAKE 1 CAPSULE BY MOUTH ONCE DAILY IF NEEDED   GLECAPREVIR-PIBRENTASVIR (MAVYRET) 100-40 MG TABS    Take 3 tablets by mouth daily. Take 3 tablets by mouth daily with a meal.   HYDROCHLOROTHIAZIDE (HYDRODIURIL) 12.5 MG TABLET    Take 1 tablet (12.5 mg total) by mouth daily.   HYDROCODONE-ACETAMINOPHEN (NORCO/VICODIN) 5-325 MG TABLET    Take 1 tablet by mouth every 6 (six) hours as needed for moderate pain.   IBUPROFEN (ADVIL,MOTRIN) 200 MG TABLET    Take 200 mg by mouth every 6 (six) hours as needed.   LACOSAMIDE 150 MG TABS    Take 1 tablet (150 mg total) by mouth 2 (two) times daily.   LISINOPRIL (ZESTRIL) 10 MG TABLET    Take 1 tablet (10 mg total) by mouth daily.   TRIAMCINOLONE CREAM (KENALOG) 0.1 %    APPLY TO AFFECTED AREA TWICE A DAY  Modified Medications   No medications on file  Discontinued Medications   No medications on file    Allergies: No Known Allergies  Past Medical History: Past Medical History:  Diagnosis Date  . Hepatitis C   . Hypertension   . PVD (peripheral vascular disease) ([place])   . Seizures ([place])    most recent Jan 2020    Social History: Social History   Socioeconomic History  . Marital status: Legally Separated   Spouse name: Not on file  . Number of children: Not on file  . Years of education: Not on file  . 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 Smoking status: Former Smoker    Quit date: 03/26/1988    Years since quitting: 31.3  . Smokeless tobacco: Never Used  Substance and Sexual Activity  . Alcohol use: No  . Drug use: No  . Sexual activity: Not on file  Other Topics Concern  . Not on file  Social History Narrative   04/13/18 living with brother, [name]   Social Determinants of Health   Financial Resource Strain:   . Difficulty of Paying Living Expenses:   Food Insecurity:   . Worried About Charity fundraiser in the Last Year:   . Arboriculturist in the Last Year:   Transportation Needs:   . Film/video editor (Medical):   [name] Lack of Transportation (Non-Medical):   Physical Activity:   . Days of Exercise per Week:   . Minutes of Exercise per Session:   Stress:   . Feeling of Stress :   Social Connections:   . Frequency of Communication with Friends and Family:   . Frequency of Social Gatherings with Friends and Family:   .  Attends Religious Services:   . Active Member of Clubs or Organizations:   . Attends Archivist Meetings:   [name] Marital Status:     Labs: Hepatitis C Lab Results  Component Value Date   HCVGENOTYPE 1b 08/07/2016   HCVRNAPCRQN 2,890,000 (H) 06/11/2019   FIBROSTAGE F4 06/11/2019   Hepatitis B Lab Results  Component Value Date   HEPBSAB NON-REACTIVE 06/11/2019   HEPBSAG NON-REACTIVE 06/11/2019   HEPBCAB NON REACTIVE 08/07/2016   Hepatitis A Lab Results  Component Value Date   HAV NON REACTIVE 08/07/2016   HIV Lab Results  Component Value Date   HIV NON-REACTIVE 06/11/2019   HIV NONREACTIVE 08/07/2016   Lab Results  Component Value Date   CREATININE 1.13 06/11/2019   CREATININE 1.56 (H) 12/09/2018   CREATININE 1.17 06/26/2018   CREATININE 1.21 04/16/2018    CREATININE 1.62 (H) 02/26/2018   Lab Results  Component Value Date   AST 21 06/11/2019   AST 32 12/09/2018   AST 23 06/26/2018   ALT 13 06/11/2019   ALT 14 06/11/2019   ALT 24 12/09/2018   INR 1.1 06/11/2019   INR 1.0 08/07/2016    Assessment: Jeffrey Serrano presents today accompanied with his brother for his 4-week follow-up appointment after starting [place] x8 weeks for hepatitis C treatment on 4/14. He was initially diagnosed with Hepatitis C in May 2018 with an initial RNA VL of 1.45 million, genotype 1b, and fibrosis score of F4, however never started therapy (only risk factor is age). Current RNA VL as of 06/11/2019 is 2.89 million, LFTs wnl and he remains asymptomatic. He is completely adherent to Mavyret 3 tablets daily at 12PM, has not missed any doses and denies any side effects. Patient has picked up his second refill of [place] and has about 13 days remaining. Will check RNA viral load today.   Plan: - Labs: HCV RNA VL - F/u with Cassie in 4 weeks for EOT visit on 6/24 at 2:30PM  Lorel Monaco, PharmD PGY1 [facility] 08/09/2019, 2:15 PM

## 2019-08-11 LAB — HEPATITIS C RNA QUANTITATIVE
HCV Quantitative Log: <1.18 Log IU/mL
HCV RNA, PCR, QN: <15 IU/mL

## 2019-09-09 ENCOUNTER — Ambulatory Visit (INDEPENDENT_AMBULATORY_CARE_PROVIDER_SITE_OTHER): Payer: PPO | Admitting: Pharmacist

## 2019-09-09 ENCOUNTER — Other Ambulatory Visit: Payer: Self-pay

## 2019-09-09 DIAGNOSIS — B182 Chronic viral hepatitis C: Secondary | ICD-10-CM | POA: Diagnosis not present

## 2019-09-09 NOTE — Progress Notes (Signed)
HPI: Jeffrey Serrano is a 69 y.o. male who presents to the [facility] clinic for Hepatitis C follow-up.  Medication: Mavyret x 8 weeks  Start Date: 4/14  Hepatitis C Genotype: 1b  Fibrosis Score: F4  Hepatitis C RNA: 2.89 million on 3/26; <15 on 5/24  Patient Active Problem List   Diagnosis Date Noted  . Chronic hepatitis C without hepatic coma ([place]) 08/07/2016  . Seizure disorder ([place]) 05/22/2016  . DEPRESSION 09/10/2006  . Essential hypertension 09/10/2006  . PERIPHERAL VASCULAR DISEASE 09/10/2006  . Seizures ([place]) 09/10/2006    Patient's Medications  New Prescriptions   No medications on file  Previous Medications   CHOLECALCIFEROL (VITAMIN D3) 1000 UNITS CAPS    Take by mouth.   GABAPENTIN (NEURONTIN) 100 MG CAPSULE    TAKE 1 CAPSULE BY MOUTH ONCE DAILY IF NEEDED   HYDROCHLOROTHIAZIDE (HYDRODIURIL) 12.5 MG TABLET    Take 1 tablet (12.5 mg total) by mouth daily.   HYDROCODONE-ACETAMINOPHEN (NORCO/VICODIN) 5-325 MG TABLET    Take 1 tablet by mouth every 6 (six) hours as needed for moderate pain.   IBUPROFEN (ADVIL,MOTRIN) 200 MG TABLET    Take 200 mg by mouth every 6 (six) hours as needed.   LACOSAMIDE 150 MG TABS    Take 1 tablet (150 mg total) by mouth 2 (two) times daily.   LISINOPRIL (ZESTRIL) 10 MG TABLET    Take 1 tablet (10 mg total) by mouth daily.   TRIAMCINOLONE CREAM (KENALOG) 0.1 %    APPLY TO AFFECTED AREA TWICE A DAY  Modified Medications   No medications on file  Discontinued Medications   GLECAPREVIR-PIBRENTASVIR (MAVYRET) 100-40 MG TABS    Take 3 tablets by mouth daily. Take 3 tablets by mouth daily with a meal.    Allergies: No Known Allergies  Past Medical History: Past Medical History:  Diagnosis Date  . Hepatitis C   . Hypertension   . PVD (peripheral vascular disease) ([place])   . Seizures ([place])    most recent Jan 2020    Social History: Social History   Socioeconomic History  . Marital status: Legally Separated    Spouse name:  Not on file  . Number of children: Not on file  . Years of education: Not on file  . 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 Smoking status: Former Smoker    Quit date: 03/26/1988    Years since quitting: 31.4  . Smokeless tobacco: Never Used  Substance and Sexual Activity  . Alcohol use: No  . Drug use: No  . Sexual activity: Not on file  Other Topics Concern  . Not on file  Social History Narrative   04/13/18 living with brother, [name]   Social Determinants of Health   Financial Resource Strain:   . Difficulty of Paying Living Expenses:   Food Insecurity:   . Worried About Charity fundraiser in the Last Year:   . Arboriculturist in the Last Year:   Transportation Needs:   . Film/video editor (Medical):   [name] Lack of Transportation (Non-Medical):   Physical Activity:   . Days of Exercise per Week:   . Minutes of Exercise per Session:   Stress:   . Feeling of Stress :   Social Connections:   . Frequency of Communication with Friends and Family:   . Frequency of Social Gatherings with Friends and Family:   .  Attends Religious Services:   . Active Member of Clubs or Organizations:   . Attends Archivist Meetings:   [name] Marital Status:     Labs: Hepatitis C Lab Results  Component Value Date   HCVGENOTYPE 1b 08/07/2016   HCVRNAPCRQN <15 NOT DETECTED 08/09/2019   HCVRNAPCRQN 2,890,000 (H) 06/11/2019   FIBROSTAGE F4 06/11/2019   Hepatitis B Lab Results  Component Value Date   HEPBSAB NON-REACTIVE 06/11/2019   HEPBSAG NON-REACTIVE 06/11/2019   HEPBCAB NON REACTIVE 08/07/2016   Hepatitis A Lab Results  Component Value Date   HAV NON REACTIVE 08/07/2016   HIV Lab Results  Component Value Date   HIV NON-REACTIVE 06/11/2019   HIV NONREACTIVE 08/07/2016   Lab Results  Component Value Date   CREATININE 1.13 06/11/2019   CREATININE 1.56 (H) 12/09/2018   CREATININE 1.17 06/26/2018    CREATININE 1.21 04/16/2018   CREATININE 1.62 (H) 02/26/2018   Lab Results  Component Value Date   AST 21 06/11/2019   AST 32 12/09/2018   AST 23 06/26/2018   ALT 13 06/11/2019   ALT 14 06/11/2019   ALT 24 12/09/2018   INR 1.1 06/11/2019   INR 1.0 08/07/2016    Assessment: Jeffrey Serrano is here today for his end of therapy Hepatitis C visit. He completed 8 weeks of Mavyret last week without missing any doses or having any side effects or issues.  He took all three tablets together with food at the same time each day at noon. No questions or concerns. His early on treatment viral load was already undetectable in May. Will check again today and send him back to [facility] for his cure visit in 3 months.     Plan: - Hep C RNA today - F/u with Greg in 3 months for SVR12 visit  Marge Vandermeulen L. Maigan Bittinger, PharmD, BCIDP, AAHIVP, CPP Clinical Pharmacist Practitioner Infectious Diseases [facility] 09/09/2019, 2:40 PM

## 2019-09-12 LAB — HEPATITIS C RNA QUANTITATIVE
HCV Quantitative Log: <1.18 Log IU/mL
HCV RNA, PCR, QN: <15 IU/mL

## 2019-09-30 ENCOUNTER — Encounter: Payer: Self-pay | Admitting: Pharmacist

## 2019-10-13 ENCOUNTER — Ambulatory Visit: Payer: PPO | Admitting: Adult Health

## 2019-10-13 ENCOUNTER — Encounter: Payer: Self-pay | Admitting: Adult Health

## 2019-10-13 VITALS — BP 124/80 | HR 74 | Ht 67.0 in | Wt 155.0 lb

## 2019-10-13 DIAGNOSIS — G40919 Epilepsy, unspecified, intractable, without status epilepticus: Secondary | ICD-10-CM

## 2019-10-13 DIAGNOSIS — G40909 Epilepsy, unspecified, not intractable, without status epilepticus: Secondary | ICD-10-CM

## 2019-10-13 MED ORDER — LACOSAMIDE 200 MG PO TABS: 200.0000 mg | ORAL_TABLET | Freq: Two times a day (BID) | ORAL | 5 refills | Status: DC

## 2019-10-13 NOTE — Progress Notes (Signed)
[facility]  PATIENT: Jeffrey Serrano DOB: Sep 08, 1950   REASON FOR VISIT: Follow-up for seizures HISTORY FROM: Patient and brother    HISTORY OF PRESENT ILLNESS:  Today, 10/13/2019, [name] returns for 55-month follow-up regarding seizures accompanied by his brother.  Continues on Vimpat 150 mg twice daily tolerating well without side effects  Reports seizure activity this afternoon consisting of tonic-clonic activity lasting approximately 5 minutes by his brother.  Denies postictal state after. Brother unaware of any additional events with prior witnessed event 11/2018.  He denies missing any doses of Vimpat, recent illness, infection, increased stressors or other seizure provoking triggers.     History: Jeffrey Serrano is a 69 y.o. male with PMH of Seizure disorder who presents as a new patient for Seizure.  He was initially evaluated by Dr. [name] on 09/29/2015 due to reoccurring seizures despite Dilantin use.  He was transitioned to Vimpat 100 mg twice daily and eventually discontinue Dilantin.  MRI brain w/wo contrast obtained 11/2015 which was unremarkable.  He had been stable throughout the years with routine follow-ups since initial evaluation but was seen on 12/09/2018 due to description of generalized tonic-clonic seizure activity on 12/06/2018 and again on 12/07/2018.  Lab work obtained which was overall unremarkable except slightly elevated WBC with UA normal and slightly elevated kidney function advised to follow-up with PCP.  He endorses ongoing compliance with him at therefore recommended increasing dosage to 150 mg twice daily.          REVIEW OF SYSTEMS: Full 14 system review of systems performed and notable only for those listed, all others are neg:  Constitutional: neg  Cardiovascular: neg Ear/Nose/Throat: neg  Skin: neg Eyes: neg Respiratory: neg Gastroitestinal: neg  Hematology/Lymphatic: neg  Endocrine:  neg Musculoskeletal:neg Allergy/Immunology: neg Neurological: Seizures Psychiatric: neg Sleep : neg   ALLERGIES: No Known Allergies  HOME MEDICATIONS: Outpatient Medications Prior to Visit  Medication Sig Dispense Refill  . Cholecalciferol (VITAMIN D3) 1000 units CAPS Take by mouth.    . gabapentin (NEURONTIN) 100 MG capsule TAKE 1 CAPSULE BY MOUTH ONCE DAILY IF NEEDED 30 capsule 0  . hydrochlorothiazide (HYDRODIURIL) 12.5 MG tablet Take 1 tablet (12.5 mg total) by mouth daily. 90 tablet 2  . HYDROcodone-acetaminophen (NORCO/VICODIN) 5-325 MG tablet Take 1 tablet by mouth every 6 (six) hours as needed for moderate pain. 10 tablet 0  . ibuprofen (ADVIL,MOTRIN) 200 MG tablet Take 200 mg by mouth every 6 (six) hours as needed.    [name] lisinopril (ZESTRIL) 10 MG tablet Take 1 tablet (10 mg total) by mouth daily. 90 tablet 2  . triamcinolone cream (KENALOG) 0.1 % APPLY TO AFFECTED AREA TWICE A Serrano 30 g 0  . Lacosamide 150 MG TABS Take 1 tablet (150 mg total) by mouth 2 (two) times daily. 180 tablet 1   No facility-administered medications prior to visit.    PAST MEDICAL HISTORY: Past Medical History:  Diagnosis Date  . Hepatitis C   . Hypertension   . PVD (peripheral vascular disease) ([place])   . Seizures ([place])    most recent Jan 2020    PAST SURGICAL HISTORY: No past surgical history on file.  FAMILY HISTORY: Family History  Problem Relation Age of Onset  . Seizures Father     SOCIAL HISTORY: Social History   Socioeconomic History  . Marital status: Legally Separated    Spouse name: Not on file  . Number of children: Not on file  . Years of education:  Not on file  . 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 Smoking status: Former Smoker    Quit date: 03/26/1988    Years since quitting: 31.5  . Smokeless tobacco: Never Used  Substance and Sexual Activity  . Alcohol use: No  . Drug use: No  . Sexual  activity: Not on file  Other Topics Concern  . Not on file  Social History Narrative   04/13/18 living with brother, Jeffrey Serrano   Social Determinants of Health   Financial Resource Strain:   . Difficulty of Paying Living Expenses:   Food Insecurity:   . Worried About Charity fundraiser in the Last Year:   . Arboriculturist in the Last Year:   Transportation Needs:   . Film/video editor (Medical):   [name] Lack of Transportation (Non-Medical):   Physical Activity:   . Days of Exercise per Week:   . Minutes of Exercise per Session:   Stress:   . Feeling of Stress :   Social Connections:   . Frequency of Communication with Friends and Family:   . Frequency of Social Gatherings with Friends and Family:   . Attends Religious Services:   . Active Member of Clubs or Organizations:   . Attends Archivist Meetings:   [name] Marital Status:   Intimate Partner Violence:   . Fear of Current or Ex-Partner:   . Emotionally Abused:   [name] Physically Abused:   . Sexually Abused:      PHYSICAL EXAM  Vitals:   10/13/19 1326  BP: 124/80  Pulse: 74  Weight: 155 lb (70.3 kg)  Height: 5\' 7"  (1.702 m)   Body mass index is 24.28 kg/m.  General: well developed, well nourished,  pleasant middle-aged African-American male, seated, in no evident distress Head: head normocephalic and atraumatic.   Neck: supple with no carotid or supraclavicular bruits Cardiovascular: regular rate and rhythm, no murmurs Musculoskeletal: no deformity Skin:  no rash/petichiae Vascular:  Normal pulses all extremities   Neurologic Exam Mental Status: Awake and fully alert.  Normal speech and language. Oriented to place and time. Recent and remote memory intact. Attention span, concentration and fund of knowledge appropriate. Mood and affect appropriate.  Cranial Nerves: Pupils equal, briskly reactive to light. Extraocular movements full without nystagmus. Visual fields full to confrontation. Hearing intact.  Facial sensation intact. Face, tongue, palate moves normally and symmetrically.  Motor: Normal bulk and tone. Normal strength in all tested extremity muscles. Sensory.: intact to touch , pinprick , position and vibratory sensation.  Coordination: Rapid alternating movements normal in all extremities. Finger-to-nose and heel-to-shin performed accurately bilaterally. Gait and Station: Arises from chair without difficulty. Stance is normal. Gait demonstrates normal stride length and balance Reflexes: 1+ and symmetric. Toes downgoing.    DIAGNOSTIC DATA (LABS, IMAGING, TESTING) - I reviewed patient records, labs, notes, testing and imaging myself where available.  Lab Results  Component Value Date   WBC 6.1 06/11/2019   HGB 16.1 06/11/2019   HCT 46.3 06/11/2019   MCV 92.2 06/11/2019   PLT 201 06/11/2019      Component Value Date/Time   NA 135 06/11/2019 1145   NA 135 12/09/2018 1606   K 4.1 06/11/2019 1145   CL 100 06/11/2019 1145   CO2 24 06/11/2019 1145   GLUCOSE 91 06/11/2019 1145   BUN 11 06/11/2019 1145   BUN 20 12/09/2018 1606  CREATININE 1.13 06/11/2019 1145   CALCIUM 9.6 06/11/2019 1145   PROT 7.1 06/11/2019 1145   PROT 7.6 12/09/2018 1606   ALBUMIN 4.6 12/09/2018 1606   AST 21 06/11/2019 1145   ALT 13 06/11/2019 1145   ALT 14 06/11/2019 1145   ALKPHOS 50 12/09/2018 1606   BILITOT 0.9 06/11/2019 1145   BILITOT 1.8 (H) 12/09/2018 1606   GFRNONAA 45 (L) 12/09/2018 1606   GFRNONAA 44 (L) 08/07/2016 1403   GFRAA 52 (L) 12/09/2018 1606   GFRAA 50 (L) 08/07/2016 1403   Lab Results  Component Value Date   CHOL 152 06/26/2018   HDL 46 06/26/2018   LDLCALC 94 06/26/2018   TRIG 59 06/26/2018   CHOLHDL 3.3 06/26/2018   Lab Results  Component Value Date   HGBA1C 6.0 (H) 06/26/2018   Lab Results  Component Value Date   LSLHTDSK87 681 07/21/2009   Lab Results  Component Value Date   TSH 1.129 07/21/2009      ASSESSMENT AND PLAN RAVINDRA BARANEK is a 69 y.o.  male with PMH of HepC and Seizure disorder follows up for seizure evaluation. Patient and his brother are both poor historian, not sure about when seizure started or when Dilantin was initially started.  Apparently, he had seizure in 04/2013 and in 08/2015, during both seizures, he lost consciousness and fell.  AED switched to Vimpat 100 mg twice daily.  Breakthrough seizure 11/2018 with increasing Vimpat to 150 mg twice daily and breakthrough seizure today consisting of tonic-clonic type activity   -Increase Vimpat to 200mg  twice daily due to recent breakthrough seizure -Advised to call with any difficulty tolerating or additional seizure activity -Discussed avoidance of seizure provoking triggers   Follow-up in 6 months or call earlier if needed   I spent 20 minutes of face-to-face and non-face-to-face time with patient and brother.  This included previsit chart review, lab review, study review, order entry, electronic health record documentation, patient education regarding recent seizure activity, increasing Vimpat dosage and answered all questions to patient and brothers satisfaction   Frann Rider, [facility]  [facility] [address] [place], [place] [address]  Phone [phone] Fax [phone] Note: This document was prepared with digital dictation and possible smart phrase technology. Any transcriptional errors that result from this process are unintentional.

## 2019-10-13 NOTE — Patient Instructions (Signed)
Your Plan:  Increase Vimpat to 200 mg twice daily due to breakthrough seizure  Please call office if you experience any additional seizure or seizure type activity or have difficulty tolerating increased dosage   Follow-up in 6 months or call earlier if needed     Thank you for coming to see [place] at [facility]. I hope we have been able to provide you high quality care today.  You may receive a patient satisfaction survey over the next few weeks. We would appreciate your feedback and comments so that we may continue to improve ourselves and the health of our patients.

## 2019-10-14 NOTE — Progress Notes (Signed)
I agree with the above plan 

## 2019-10-20 ENCOUNTER — Telehealth: Payer: Self-pay | Admitting: Adult Health

## 2019-10-20 NOTE — Telephone Encounter (Signed)
Please try to decrease a.m. dose to 150 and continue nighttime dose of 200 and see if this helps

## 2019-10-20 NOTE — Telephone Encounter (Signed)
Pt's brother Aidyn Kellis (on [place]) Pt having side effects, Pt said he feels drunk. to increase dosage of lacosamide (VIMPAT) 200 MG TABS tablet. Mr. Teaster ask if dosage can be cut in half. Would like a call from the nurse. Mr. Ghrist said medication had been sent to the wrong pharmacy. Should send to [facility]

## 2019-10-20 NOTE — Telephone Encounter (Signed)
I called spoke to brother of pt.  Pt since in the increase of lacosamide 200mg  po bid, he is having feelings like he is drunk, no slurred speech, has swaggering gait, no falls.  No other reason why he is having the sx other then increase.   No seizures.   Please advise.

## 2019-10-21 NOTE — Addendum Note (Signed)
Addended by: Brandon Melnick on: 10/21/2019 01:17 PM   Modules accepted: Orders

## 2019-10-21 NOTE — Telephone Encounter (Signed)
Both pt and brother came in today with there medications (vimpat).  I relayed that he is to take 150mg  po am and 200mg  po pm (willus up what he has) 150mg  tabs and 50mg  tabs (samples).  He verbalized understanding.  Written instructions given as well.

## 2019-10-21 NOTE — Telephone Encounter (Signed)
I called brother of pt .  I explained that per JM/NP to decrease morning dose to 150mg  and keep pm dose to 200mg  . He relayed that he has 50mg  tablets too.  (2 bottles of 150mg  tabs) and 200mg  tablets.  It was a little confusing so I asked him to come and bring all vimpat tablets with him so we can see what he has.  He will come today sometime and I will assist him.

## 2019-10-25 MED ORDER — VIMPAT 150 MG PO TABS: 150.0000 mg | ORAL_TABLET | Freq: Every morning | ORAL | 5 refills | Status: AC

## 2019-10-25 MED ORDER — LACOSAMIDE 200 MG PO TABS: 200.0000 mg | ORAL_TABLET | Freq: Every day | ORAL | 5 refills | Status: AC

## 2019-10-25 NOTE — Addendum Note (Signed)
Addended by: Mal Misty on: 10/25/2019 07:47 AM   Modules accepted: Orders

## 2019-11-02 ENCOUNTER — Ambulatory Visit: Payer: PPO | Admitting: Podiatry

## 2019-11-02 ENCOUNTER — Encounter: Payer: Self-pay | Admitting: Podiatry

## 2019-11-02 ENCOUNTER — Other Ambulatory Visit: Payer: Self-pay

## 2019-11-02 DIAGNOSIS — M2042 Other hammer toe(s) (acquired), left foot: Secondary | ICD-10-CM

## 2019-11-02 DIAGNOSIS — M79674 Pain in right toe(s): Secondary | ICD-10-CM

## 2019-11-02 DIAGNOSIS — M79675 Pain in left toe(s): Secondary | ICD-10-CM

## 2019-11-02 DIAGNOSIS — I739 Peripheral vascular disease, unspecified: Secondary | ICD-10-CM | POA: Diagnosis not present

## 2019-11-02 DIAGNOSIS — B351 Tinea unguium: Secondary | ICD-10-CM | POA: Diagnosis not present

## 2019-11-02 DIAGNOSIS — L84 Corns and callosities: Secondary | ICD-10-CM

## 2019-11-02 DIAGNOSIS — M2041 Other hammer toe(s) (acquired), right foot: Secondary | ICD-10-CM

## 2019-11-02 NOTE — Patient Instructions (Signed)
Apply triple antibiotic ointment to right 2nd digit nailbed once daily for one week.   Moisturize feet once daily; do not apply between toes: A.  Aquaphor Healing Ointment B.  Vaseline Intensive Care Lotion C.  Lubriderm Lotion D.  Gold Bond Diabetic Foot Lotion E.  Eucerin Intensive Repair Moisturizing Lotion  If you have problems reaching your feet:  A.  Aquaphor Advanced Therapy Ointment Body Spray B.  Vaseline Intensive Care Spray Lotion Advanced Repair

## 2019-11-04 NOTE — Progress Notes (Signed)
Subjective: Jeffrey Serrano is a 69 y.o. male patient seen today for at risk foot care. Patient has h/o PAD and corn(s) b/l 5th digits and callus(es) b/l feet and painful mycotic nails b/l.  Pain interferes with ambulation. Aggravating factors include wearing enclosed shoe gear.   He voices no new pedal concerns on today's visit. Relates no claudication/rest pain.  He also takes gabapentin for neuropathic pain.  Patient Active Problem List   Diagnosis Date Noted  . Chronic hepatitis C without hepatic coma ([employer]) 08/07/2016  . Seizure disorder ([place]) 05/22/2016  . DEPRESSION 09/10/2006  . Essential hypertension 09/10/2006  . PERIPHERAL VASCULAR DISEASE 09/10/2006  . Seizures ([place]) 09/10/2006    Current Outpatient Medications on File Prior to Visit  Medication Sig Dispense Refill  . Cholecalciferol (VITAMIN D3) 1000 units CAPS Take by mouth.    . gabapentin (NEURONTIN) 100 MG capsule TAKE 1 CAPSULE BY MOUTH ONCE DAILY IF NEEDED 30 capsule 0  . hydrochlorothiazide (HYDRODIURIL) 12.5 MG tablet Take 1 tablet (12.5 mg total) by mouth daily. 90 tablet 2  . HYDROcodone-acetaminophen (NORCO/VICODIN) 5-325 MG tablet Take 1 tablet by mouth every 6 (six) hours as needed for moderate pain. 10 tablet 0  . ibuprofen (ADVIL,MOTRIN) 200 MG tablet Take 200 mg by mouth every 6 (six) hours as needed.    . Lacosamide (VIMPAT) 150 MG TABS Take 1 tablet (150 mg total) by mouth in the morning. 30 tablet 5  . lacosamide (VIMPAT) 200 MG TABS tablet Take 1 tablet (200 mg total) by mouth at bedtime. 30 tablet 5  . lisinopril (ZESTRIL) 10 MG tablet Take 1 tablet (10 mg total) by mouth daily. 90 tablet 2  . triamcinolone cream (KENALOG) 0.1 % APPLY TO AFFECTED AREA TWICE A DAY 30 g 0   No current facility-administered medications on file prior to visit.    No Known Allergies  Objective: Physical Exam  General: Jeffrey Serrano is a pleasant 69 y.o. AA male, WD, WN in NAD. AAO x 3.   Vascular:  Capillary refill  time to digits <4 seconds b/l lower extremities. Nonpalpable DP pulse(s) b/l lower extremities. Nonpalpable PT pulse(s) b/l lower extremities. Pedal hair absent. Lower extremity skin temperature gradient within normal limits. No edema noted b/l lower extremities. No ischemia or gangrene noted b/l lower extremities.  Dermatological:  Pedal skin is thin shiny, atrophic b/l lower extremities. No open wounds bilaterally. No interdigital macerations bilaterally. Toenails 1-5 left, R hallux, R 3rd toe, R 4th toe and R 5th toe elongated, discolored, dystrophic, thickened, and crumbly with subungual debris and tenderness to dorsal palpation. Hyperkeratotic lesion(s) L 5th toe, R 5th toe, submet head 5 left foot and submet head 5 right foot.  No erythema, no edema, no drainage, no flocculence.   There is noted onchyolysis of entire nailplate of the right second digit with minimal attachment to nailbed.  The nailbed remains intact. There is no erythema, no edema, no drainage, no underlying flocculence. No ischemia and no gangrene noted.  Musculoskeletal:  Normal muscle strength 5/5 to all lower extremity muscle groups bilaterally. No pain crepitus or joint limitation noted with ROM b/l. Adductovarus deformity b/l 5th digits.  Neurological:  Protective sensation intact 5/5 intact bilaterally with 10g monofilament b/l. Vibratory sensation intact b/l.  Assessment and Plan:  1. Pain due to onychomycosis of toenails of both feet   2. Corns   3. Acquired hammertoes of both feet   4. PAD (peripheral artery disease) ([place])    -Examined  patient. -Toenails 1-5 left, R hallux, R 3rd toe, R 4th toe and R 5th toe debrided in length and girth without iatrogenic bleeding with sterile nail nipper and dremel.  -Right 2nd digit nailplate gently debrided from it's remaining attachment to digit. Nailbed cleansed with alcohol. Triple antibiotic ointment applied. He was instructed to apply triple antibiotic ointment to right  2nd digit nailbed once daily for one week. Call if he has any problems.  -Corn(s) L 5th toe and R 5th toe pared utilizing sterile scalpel blade without complication or incident. Total number debrided=2. -Outpatient referral to [facility], routine, to Dr. Servando Snare, for diminished pedal pulses b/l LE. -Patient to report any pedal injuries to medical professional immediately. -Patient to continue soft, supportive shoe gear daily. -Patient/POA to call should there be question/concern in the interim.  Return in about 3 months (around 02/02/2020) for diabetic nail and callus trim.  Marzetta Board, DPM

## 2019-12-01 ENCOUNTER — Telehealth: Payer: Self-pay | Admitting: Adult Health

## 2019-12-01 NOTE — Telephone Encounter (Signed)
I called pharmacy [facility] he has available refills costing $9.00 each for the vimpat.  I called pts brother, [name].  I explained that pt should be taking 150mg  po am and 200mg  po pm.  He stated that he had 52 tabs of the 150mg  I relayed to [place] those in am and to pick up 200mg  tabs at pharmacy and use in pm.  He verbalized understanding.

## 2019-12-01 NOTE — Telephone Encounter (Signed)
We cannot keep giving out samples, if need to do something different in re: to affording this medication then PAP is option. I told him cost and he seemed to think this was ok. apprx $9.00 per prescription.

## 2019-12-01 NOTE — Telephone Encounter (Signed)
Pt's brother, Obadiah Dennard called need sample of 50 mg of Vimpat because Pt out of the 50 mg that goes with the 150 mg.

## 2019-12-20 ENCOUNTER — Ambulatory Visit (INDEPENDENT_AMBULATORY_CARE_PROVIDER_SITE_OTHER): Payer: PPO | Admitting: Family

## 2019-12-20 ENCOUNTER — Other Ambulatory Visit: Payer: Self-pay

## 2019-12-20 ENCOUNTER — Encounter: Payer: Self-pay | Admitting: Family

## 2019-12-20 VITALS — BP 136/88 | HR 71 | Temp 97.9°F | Resp 16 | Ht 67.0 in | Wt 162.0 lb

## 2019-12-20 DIAGNOSIS — Z23 Encounter for immunization: Secondary | ICD-10-CM | POA: Diagnosis not present

## 2019-12-20 DIAGNOSIS — B182 Chronic viral hepatitis C: Secondary | ICD-10-CM | POA: Diagnosis not present

## 2019-12-20 NOTE — Patient Instructions (Signed)
Nice to see you.  We will check your lab work today.   If your lab work is negative you have been cured from Hepatitis C.   If you need to be screened in the future, please have them check a Hepatitis C RNA level (viral load) as the antibody test will always be positive.  Recommend liver cancer screening every 6 months with abdominal ultrasound with or without alpha-feroprotein (blood test). This can be done by your PCP.   Please let us know if you have any questions.   No follow up is needed pending blood work results.   Have a great day and stay safe!

## 2019-12-20 NOTE — Progress Notes (Signed)
Subjective:    Patient ID: BEREKET GERNERT, male    DOB: [birth year], 69 y.o.   MRN: [id number]  Chief Complaint  Patient presents with  . Hepatitis C     HPI:  JOSHWA HEMRIC is a 69 y.o. male with genotype 1b chronic hepatitis C with initial viral load of 2.89 million who was last seen in the office on 09/09/2019 with good adherence and tolerance to his [place].  Viral load at the time was undetectable.  Here today for cure visit.  Mr. Gerrard has been doing well since completing his medication back at the end of June with no new abdominal pain, nausea, scleral icterus, or jaundice.   No Known Allergies    Outpatient Medications Prior to Visit  Medication Sig Dispense Refill  . Cholecalciferol (VITAMIN D3) 1000 units CAPS Take by mouth.    . gabapentin (NEURONTIN) 100 MG capsule TAKE 1 CAPSULE BY MOUTH ONCE DAILY IF NEEDED 30 capsule 0  . hydrochlorothiazide (HYDRODIURIL) 12.5 MG tablet Take 1 tablet (12.5 mg total) by mouth daily. 90 tablet 2  . HYDROcodone-acetaminophen (NORCO/VICODIN) 5-325 MG tablet Take 1 tablet by mouth every 6 (six) hours as needed for moderate pain. 10 tablet 0  . ibuprofen (ADVIL,MOTRIN) 200 MG tablet Take 200 mg by mouth every 6 (six) hours as needed.    . Lacosamide (VIMPAT) 150 MG TABS Take 1 tablet (150 mg total) by mouth in the morning. 30 tablet 5  . lacosamide (VIMPAT) 200 MG TABS tablet Take 1 tablet (200 mg total) by mouth at bedtime. 30 tablet 5  . lisinopril (ZESTRIL) 10 MG tablet Take 1 tablet (10 mg total) by mouth daily. 90 tablet 2  . triamcinolone cream (KENALOG) 0.1 % APPLY TO AFFECTED AREA TWICE A DAY 30 g 0   No facility-administered medications prior to visit.     Past Medical History:  Diagnosis Date  . Hepatitis C   . Hypertension   . PVD (peripheral vascular disease) ([place])   . Seizures ([place])    most recent Jan 2020     History reviewed. No pertinent surgical history.     Review of Systems  Constitutional: Negative  for chills, diaphoresis, fatigue and fever.  Respiratory: Negative for cough, chest tightness, shortness of breath and wheezing.   Cardiovascular: Negative for chest pain.  Gastrointestinal: Negative for abdominal distention, abdominal pain, constipation, diarrhea, nausea and vomiting.  Neurological: Negative for weakness and headaches.  Hematological: Does not bruise/bleed easily.      Objective:    BP 136/88   Pulse 71   Temp 97.9 F (36.6 C) (Oral)   Resp 16   Ht '5\' 7"'  (1.702 m)   Wt 162 lb (73.5 kg)   SpO2 98%   BMI 25.37 kg/m  Nursing note and vital signs reviewed.  Physical Exam Constitutional:      General: He is not in acute distress.    Appearance: He is well-developed.  Cardiovascular:     Rate and Rhythm: Normal rate and regular rhythm.     Heart sounds: Normal heart sounds.  Pulmonary:     Effort: Pulmonary effort is normal.     Breath sounds: Normal breath sounds.  Skin:    General: Skin is warm and dry.  Neurological:     Mental Status: He is alert and oriented to person, place, and time.  Psychiatric:        Behavior: Behavior normal.        Thought Content:  Thought content normal.        Judgment: Judgment normal.      Depression screen [facility] 2/9 12/20/2019 07/09/2019 08/04/2018 08/03/2018 07/20/2018  Decreased Interest 0 0 0 0 0  Down, Depressed, Hopeless 0 0 0 0 0  PHQ - 2 Score 0 0 0 0 0       Assessment & Plan:    Patient Active Problem List   Diagnosis Date Noted  . Chronic hepatitis C without hepatic coma ([place]) 08/07/2016  . Seizure disorder ([place]) 05/22/2016  . DEPRESSION 09/10/2006  . Essential hypertension 09/10/2006  . PERIPHERAL VASCULAR DISEASE 09/10/2006  . Seizures ([place]) 09/10/2006     Problem List Items Addressed This Visit      Digestive   Chronic hepatitis C without hepatic coma [facility]) - Primary    Mr. Vezina has completed 8 weeks of Mavyret for genotype 1b chronic hepatitis C with initial viral load of 2.89 million with  concern for compensated cirrhosis based on his fibrosis score.  Check blood work today to ensure sustained viremic response.  Encouraged to establish with gastroenterology for initial endoscopy and recommend hepatocellular carcinoma screenings every 6 months with abdominal ultrasound with or without alpha-feroprotein.  Should he need screening in the future for hepatitis C a viral load/hepatitis C RNA level will be needed as the hepatitis C antibody test will always be positive.  No additional follow-up pending blood work results.  Happy to see him back as needed.      Relevant Orders   Hepatitis C RNA quantitative   Comp Met (CMET)    Other Visit Diagnoses    Need for immunization against influenza       Relevant Orders   Flu Vaccine QUAD High Dose(Fluad) (Completed)       I am having [name] maintain his Vitamin D3, ibuprofen, HYDROcodone-acetaminophen, gabapentin, triamcinolone cream, hydrochlorothiazide, lisinopril, lacosamide, and Vimpat.   Follow-up: Pending blood work as needed.  Terri Piedra, MSN, FNP-C Nurse Practitioner [facility] [place] number: [phone]

## 2019-12-20 NOTE — Assessment & Plan Note (Signed)
Mr. Sofia has completed 8 weeks of Mavyret for genotype 1b chronic hepatitis C with initial viral load of 2.89 million with concern for compensated cirrhosis based on his fibrosis score.  Check blood work today to ensure sustained viremic response.  Encouraged to establish with gastroenterology for initial endoscopy and recommend hepatocellular carcinoma screenings every 6 months with abdominal ultrasound with or without alpha-feroprotein.  Should he need screening in the future for hepatitis C a viral load/hepatitis C RNA level will be needed as the hepatitis C antibody test will always be positive.  No additional follow-up pending blood work results.  Happy to see him back as needed.

## 2019-12-22 LAB — COMPREHENSIVE METABOLIC PANEL
AG Ratio: 1.4 (calc) (ref 1.0–2.5)
ALT: 8 U/L — ABNORMAL LOW (ref 9–46)
AST: 16 U/L (ref 10–35)
Albumin: 4.1 g/dL (ref 3.6–5.1)
Alkaline phosphatase (APISO): 62 U/L (ref 35–144)
BUN/Creatinine Ratio: 8 (calc) (ref 6–22)
BUN: 11 mg/dL (ref 7–25)
CO2: 29 mmol/L (ref 20–32)
Calcium: 9.7 mg/dL (ref 8.6–10.3)
Chloride: 99 mmol/L (ref 98–110)
Creat: 1.42 mg/dL — ABNORMAL HIGH (ref 0.70–1.18)
Globulin: 3 g/dL (calc) (ref 1.9–3.7)
Glucose, Bld: 89 mg/dL (ref 65–99)
Potassium: 3.9 mmol/L (ref 3.5–5.3)
Sodium: 135 mmol/L (ref 135–146)
Total Bilirubin: 0.6 mg/dL (ref 0.2–1.2)
Total Protein: 7.1 g/dL (ref 6.1–8.1)

## 2019-12-22 LAB — HEPATITIS C RNA QUANTITATIVE
HCV RNA, PCR, QN (Log): <1.18 log IU/mL
HCV RNA, PCR, QN: <15 IU/mL

## 2019-12-23 ENCOUNTER — Telehealth: Payer: Self-pay | Admitting: *Deleted

## 2019-12-23 NOTE — Telephone Encounter (Signed)
SCHEDULE AWV 

## 2019-12-27 ENCOUNTER — Telehealth: Payer: Self-pay

## 2019-12-27 NOTE — Telephone Encounter (Signed)
Attempted to call patient again, patient's brother answered and reports that Mr. Rineer is at work. Patient's brother asked if this was the "infections doctor" and states that he accompanied the patient to his last appointment and will have Mr. Longsworth call us back tomorrow. No private health information was shared.   Beryle Flock, RN

## 2019-12-27 NOTE — Telephone Encounter (Signed)
-----   Message from [place] sent at 12/27/2019  8:41 AM EDT ----- Please inform Jeffrey Serrano that his blood work shows that he is cured from Hepatitis C and recommend continued routine screening for liver cancer every 6 months going forward with PCP. No follow up needed.

## 2019-12-27 NOTE — Telephone Encounter (Signed)
Attempted to call the patient to relay lab results per Terri Piedra, NP. No answer on either number, please continue to follow.  Beryle Flock, RN

## 2019-12-28 ENCOUNTER — Other Ambulatory Visit: Payer: Self-pay | Admitting: Neurology

## 2019-12-28 NOTE — Telephone Encounter (Signed)
Lab results relayed to patient who verbalized understanding. No further questions or concerns noted. Florentina Marquart Lorita Officer, RN

## 2019-12-30 ENCOUNTER — Telehealth: Payer: Self-pay | Admitting: Neurology

## 2019-12-30 ENCOUNTER — Other Ambulatory Visit: Payer: Self-pay | Admitting: Neurology

## 2019-12-30 NOTE — Telephone Encounter (Signed)
Pt's brother called stating that they no longer need his Vimpat sent to the mail order pharmacy. He states that they were able to get this and [facility] [address] And get it for $9

## 2019-12-30 NOTE — Telephone Encounter (Signed)
[facility] calling and they need a refill for her Vimpat please fax [phone] - telephone [phone]

## 2019-12-30 NOTE — Telephone Encounter (Signed)
I spoke to [facility]/ [facility] ([facility]) for vimpat pt was enrolled for vimpat 150mg  po bid, last received 09-2019. [facility] will [facility] with that. She verbalized understanding.

## 2019-12-30 NOTE — Telephone Encounter (Signed)
LMVM for pt or brother to call me back about pts vimpat,  [facility]?  Brother called back and no need for mail order, [facility] [place] for $9.00 ( for both vimpat 150 and 200mg  tabs.

## 2020-01-03 NOTE — Telephone Encounter (Signed)
Hassan Rowan from [facility] called wanting to know when the pt's new Rx will be sent for his Vimpat. Please advise. [phone]

## 2020-01-03 NOTE — Telephone Encounter (Signed)
I called and spoke to [facility] pharmacy.  [facility]  They will make note in there system at this time.

## 2020-01-06 ENCOUNTER — Other Ambulatory Visit: Payer: Self-pay

## 2020-01-06 DIAGNOSIS — I739 Peripheral vascular disease, unspecified: Secondary | ICD-10-CM

## 2020-01-07 ENCOUNTER — Ambulatory Visit (INDEPENDENT_AMBULATORY_CARE_PROVIDER_SITE_OTHER): Payer: PPO | Admitting: Family Medicine

## 2020-01-07 ENCOUNTER — Other Ambulatory Visit: Payer: Self-pay

## 2020-01-07 ENCOUNTER — Encounter: Payer: Self-pay | Admitting: Family Medicine

## 2020-01-07 VITALS — BP 140/80 | HR 69 | Temp 97.3°F | Ht 68.0 in | Wt 158.2 lb

## 2020-01-07 DIAGNOSIS — B182 Chronic viral hepatitis C: Secondary | ICD-10-CM

## 2020-01-07 DIAGNOSIS — I10 Essential (primary) hypertension: Secondary | ICD-10-CM | POA: Diagnosis not present

## 2020-01-07 DIAGNOSIS — M792 Neuralgia and neuritis, unspecified: Secondary | ICD-10-CM

## 2020-01-07 DIAGNOSIS — R7989 Other specified abnormal findings of blood chemistry: Secondary | ICD-10-CM | POA: Diagnosis not present

## 2020-01-07 DIAGNOSIS — R569 Unspecified convulsions: Secondary | ICD-10-CM | POA: Diagnosis not present

## 2020-01-07 DIAGNOSIS — R7303 Prediabetes: Secondary | ICD-10-CM | POA: Diagnosis not present

## 2020-01-07 MED ORDER — HYDROCHLOROTHIAZIDE 12.5 MG PO TABS: 12.5000 mg | ORAL_TABLET | Freq: Every day | ORAL | 2 refills | Status: AC

## 2020-01-07 MED ORDER — GABAPENTIN 100 MG PO CAPS: ORAL_CAPSULE | ORAL | 0 refills | Status: DC

## 2020-01-07 MED ORDER — LISINOPRIL 10 MG PO TABS: 10.0000 mg | ORAL_TABLET | Freq: Every day | ORAL | 2 refills | Status: AC

## 2020-01-07 NOTE — Patient Instructions (Addendum)
   Continue to avoid medicine like advil/alleve. I will check your kidney test again today. Make sure to drink plenty of water throughout the day.   No med changes at this time.  I will check some lab work and let me know if there are concerns.  I did refer you to gastroenterology for ongoing follow-up after recent treatment for hepatitis C.  Recheck with me in 6 months   If you have lab work done today you will be contacted with your lab results within the next 2 weeks.  If you have not heard from [place] then please contact us. The fastest way to get your results is to register for My Chart.   IF you received an x-ray today, you will receive an invoice from [facility]. Please contact [facility] at [phone] with questions or concerns regarding your invoice.   IF you received labwork today, you will receive an invoice from [facility] [place]. Please contact [facility] at [phone] with questions or concerns regarding your invoice.   Our billing staff will not be able to assist you with questions regarding bills from these companies.  You will be contacted with the lab results as soon as they are available. The fastest way to get your results is to activate your My Chart account. Instructions are located on the last page of this paperwork. If you have not heard from [place] regarding the results in 2 weeks, please contact this office.

## 2020-01-07 NOTE — Progress Notes (Signed)
Subjective:  Patient ID: Jeffrey Serrano, male    DOB: January 08, 1951  Age: 69 y.o. MRN: [id number]  CC:  Chief Complaint  Patient presents with  . Medical Management of Chronic Issues    6 m f/u     HPI Jeffrey Serrano presents for   Hypertension: HCTZ 12.5 mg daily, lisinopril 10 mg daily. Has not yet taken today.  Possible component of CKD, creatinine has varied since 2019 from 1.13 up to 1.62.  Most recent testing elevated in October. Drinking water - few bottles per day. Some soda.  No nsaids.  Home readings: unknown - reports stable readings.  No new sx's, or side effects.  BP Readings from Last 3 Encounters:  01/07/20 140/80  12/20/19 136/88  10/13/19 124/80   Lab Results  Component Value Date   CREATININE 1.42 (H) 12/20/2019   Chronic hepatitis C Followed by infectious disease, appointment with Dr. Elna Breslow on October 4.  Status post treatment with Mavyret.  Plan for gastroenterology follow-up for initial endoscopy and hepatocellular carcinoma screening every 6 months with abdominal ultrasound with or without AFP.  Did note that hep C antibody test will remain positive.  Future testing will need to be done with hep C RNA or viral load if needed.  Follow-up as needed.  Seizure disorder Treated with Vimpat, neurologist Dr. Leonie Man.  No recent seizure.   Episodic foot pain/neuropathic foot pain, Treated intermittently with gabapentin 100 mg as needed. Not daily - works when foot pain occurs with 1 pill. Podiatry follow up next month.   Prediabetes: Wt Readings from Last 3 Encounters:  01/07/20 158 lb 3.2 oz (71.8 kg)  12/20/19 162 lb (73.5 kg)  10/13/19 155 lb (70.3 kg)   Lab Results  Component Value Date   HGBA1C 6.0 (H) 06/26/2018     History Patient Active Problem List   Diagnosis Date Noted  . Chronic hepatitis C without hepatic coma ([place]) 08/07/2016  . Seizure disorder ([place]) 05/22/2016  . DEPRESSION 09/10/2006  . Essential hypertension 09/10/2006  .  PERIPHERAL VASCULAR DISEASE 09/10/2006  . Seizures ([place]) 09/10/2006   Past Medical History:  Diagnosis Date  . Hepatitis C   . Hypertension   . PVD (peripheral vascular disease) ([place])   . Seizures ([place])    most recent Jan 2020   No past surgical history on file. No Known Allergies Prior to Admission medications   Medication Sig Start Date End Date Taking? Authorizing Provider  Cholecalciferol (VITAMIN D3) 1000 units CAPS Take by mouth.   Yes [provider]  gabapentin (NEURONTIN) 100 MG capsule TAKE 1 CAPSULE BY MOUTH ONCE DAILY IF NEEDED 02/08/19  Yes Wendie Agreste, MD  hydrochlorothiazide (HYDRODIURIL) 12.5 MG tablet Take 1 tablet (12.5 mg total) by mouth daily. 07/09/19  Yes Wendie Agreste, MD  HYDROcodone-acetaminophen (NORCO/VICODIN) 5-325 MG tablet Take 1 tablet by mouth every 6 (six) hours as needed for moderate pain. 06/29/18  Yes Wendie Agreste, MD  ibuprofen (ADVIL,MOTRIN) 200 MG tablet Take 200 mg by mouth every 6 (six) hours as needed.   Yes [provider]  Lacosamide (VIMPAT) 150 MG TABS Take 1 tablet (150 mg total) by mouth in the morning. 10/25/19  Yes McCue, Janett Billow, NP  lacosamide (VIMPAT) 200 MG TABS tablet Take 1 tablet (200 mg total) by mouth at bedtime. 10/25/19  Yes McCue, Janett Billow, NP  lisinopril (ZESTRIL) 10 MG tablet Take 1 tablet (10 mg total) by mouth daily. 07/09/19  Yes Wendie Agreste,  MD  triamcinolone cream (KENALOG) 0.1 % APPLY TO AFFECTED AREA TWICE A DAY 03/15/19  Yes Wendie Agreste, MD   Social History   Socioeconomic History  . Marital status: Legally Separated    Spouse name: Not on file  . Number of children: Not on file  . Years of education: Not on file  . 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 Smoking status: Former Smoker    Quit date: 03/26/1988    Years since quitting: 31.8  . Smokeless tobacco: Never Used  Substance and Sexual Activity  .  Alcohol use: No  . Drug use: No  . Sexual activity: Not on file  Other Topics Concern  . Not on file  Social History Narrative   04/13/18 living with brother, [name]   Social Determinants of Health   Financial Resource Strain:   . Difficulty of Paying Living Expenses: Not on file  Food Insecurity:   . Worried About Charity fundraiser in the Last Year: Not on file  . Ran Out of Food in the Last Year: Not on file  Transportation Needs:   . Lack of Transportation (Medical): Not on file  . Lack of Transportation (Non-Medical): Not on file  Physical Activity:   . Days of Exercise per Week: Not on file  . Minutes of Exercise per Session: Not on file  Stress:   . Feeling of Stress : Not on file  Social Connections:   . Frequency of Communication with Friends and Family: Not on file  . Frequency of Social Gatherings with Friends and Family: Not on file  . Attends Religious Services: Not on file  . Active Member of Clubs or Organizations: Not on file  . Attends Archivist Meetings: Not on file  . Marital Status: Not on file  Intimate Partner Violence:   . Fear of Current or Ex-Partner: Not on file  . Emotionally Abused: Not on file  . Physically Abused: Not on file  . Sexually Abused: Not on file    Review of Systems  Constitutional: Negative for fatigue and unexpected weight change.  Eyes: Negative for visual disturbance.  Respiratory: Negative for cough, chest tightness and shortness of breath.   Cardiovascular: Negative for chest pain, palpitations and leg swelling.  Gastrointestinal: Negative for abdominal pain and blood in stool.  Neurological: Negative for dizziness, light-headedness and headaches.     Objective:   Vitals:   01/07/20 0908 01/07/20 0911  BP: (!) 150/84 140/80  Pulse: 69   Temp: (!) 97.3 F (36.3 C)   TempSrc: Temporal   SpO2: 98%   Weight: 158 lb 3.2 oz (71.8 kg)   Height: 5\' 8"  (1.727 m)      Physical Exam Vitals reviewed.    Constitutional:      Appearance: He is well-developed.  HENT:     Head: Normocephalic and atraumatic.  Eyes:     Pupils: Pupils are equal, round, and reactive to light.  Neck:     Vascular: No carotid bruit or JVD.  Cardiovascular:     Rate and Rhythm: Normal rate and regular rhythm.     Heart sounds: Normal heart sounds. No murmur heard.   Pulmonary:     Effort: Pulmonary effort is normal.     Breath sounds: Normal breath sounds. No rales.  Skin:    General: Skin is warm and dry.  Neurological:  Mental Status: He is alert and oriented to person, place, and time.       Assessment & Plan:  Jeffrey Serrano is a 69 y.o. male . Chronic hepatitis C without hepatic coma ([place]) - Plan: Ambulatory referral to Gastroenterology  -Status post treatment as above, refer to gastroenterology for ongoing follow-up, screen for hepatocellular CA.  Essential hypertension - Plan: lisinopril (ZESTRIL) 10 MG tablet, hydrochlorothiazide (HYDRODIURIL) 12.5 MG tablet  -Stable on recheck, no med changes  Seizures ([facility])  -Asymptomatic, continue Vimpat and follow-up with neurology as planned.  Neurogenic pain of foot, unspecified laterality Neuropathic pain of foot, unspecified laterality - Plan: gabapentin (NEURONTIN) 100 MG capsule  -Intermittent, only rare use of gabapentin, refilled.  Prediabetes - Plan: Hemoglobin A1c  -Most recent glucose reassuring but will check 10-month levels.  Elevated serum creatinine - Plan: Basic metabolic panel  -Variable readings, slight elevation recently, maintenance of hydration and avoidance of NSAIDs discussed.  Check BMP  Meds ordered this encounter  Medications  . gabapentin (NEURONTIN) 100 MG capsule    Sig: TAKE 1 CAPSULE BY MOUTH ONCE DAILY IF NEEDED    Dispense:  30 capsule    Refill:  0  . lisinopril (ZESTRIL) 10 MG tablet    Sig: Take 1 tablet (10 mg total) by mouth daily.    Dispense:  90 tablet    Refill:  2  . hydrochlorothiazide  (HYDRODIURIL) 12.5 MG tablet    Sig: Take 1 tablet (12.5 mg total) by mouth daily.    Dispense:  90 tablet    Refill:  2   Patient Instructions     Continue to avoid medicine like advil/alleve. I will check your kidney test again today. Make sure to drink plenty of water throughout the day.   No med changes at this time.  I will check some lab work and let me know if there are concerns.  I did refer you to gastroenterology for ongoing follow-up after recent treatment for hepatitis C.  Recheck with me in 6 months   If you have lab work done today you will be contacted with your lab results within the next 2 weeks.  If you have not heard from [place] then please contact us. The fastest way to get your results is to register for My Chart.   IF you received an x-ray today, you will receive an invoice from [facility]. Please contact [facility] at [phone] with questions or concerns regarding your invoice.   IF you received labwork today, you will receive an invoice from [facility]. Please contact [facility] at [phone] with questions or concerns regarding your invoice.   Our billing staff will not be able to assist you with questions regarding bills from these companies.  You will be contacted with the lab results as soon as they are available. The fastest way to get your results is to activate your My Chart account. Instructions are located on the last page of this paperwork. If you have not heard from [place] regarding the results in 2 weeks, please contact this office.          Signed, [name], MD [facility]

## 2020-01-08 LAB — BASIC METABOLIC PANEL
BUN/Creatinine Ratio: 8 — ABNORMAL LOW (ref 10–24)
BUN: 11 mg/dL (ref 8–27)
CO2: 22 mmol/L (ref 20–29)
Calcium: 9.9 mg/dL (ref 8.6–10.2)
Chloride: 100 mmol/L (ref 96–106)
Creatinine, Ser: 1.34 mg/dL — ABNORMAL HIGH (ref 0.76–1.27)
GFR calc Af Amer: 62 mL/min/{1.73_m2} (ref >59)
GFR calc non Af Amer: 53 mL/min/{1.73_m2} — ABNORMAL LOW (ref >59)
Glucose: 105 mg/dL — ABNORMAL HIGH (ref 65–99)
Potassium: 5 mmol/L (ref 3.5–5.2)
Sodium: 138 mmol/L (ref 134–144)

## 2020-01-08 LAB — HEMOGLOBIN A1C
Est. average glucose Bld gHb Est-mCnc: 143 mg/dL
Hgb A1c MFr Bld: 6.6 % — ABNORMAL HIGH (ref 4.8–5.6)

## 2020-01-19 ENCOUNTER — Encounter: Payer: Self-pay | Admitting: Radiology

## 2020-01-21 ENCOUNTER — Encounter: Payer: Self-pay | Admitting: Vascular Surgery

## 2020-01-21 ENCOUNTER — Other Ambulatory Visit: Payer: Self-pay

## 2020-01-21 ENCOUNTER — Ambulatory Visit: Payer: PPO | Admitting: Vascular Surgery

## 2020-01-21 ENCOUNTER — Ambulatory Visit (HOSPITAL_COMMUNITY)
Admission: RE | Admit: 2020-01-21 | Discharge: 2020-01-21 | Disposition: A | Discharge status: Home / Self Care | Payer: PPO | Source: Ambulatory Visit | Attending: Vascular Surgery | Admitting: Vascular Surgery

## 2020-01-21 VITALS — BP 149/99 | HR 68 | Temp 97.8°F | Resp 20 | Ht 68.0 in | Wt 159.0 lb

## 2020-01-21 DIAGNOSIS — I739 Peripheral vascular disease, unspecified: Secondary | ICD-10-CM | POA: Diagnosis not present

## 2020-01-21 NOTE — Progress Notes (Signed)
Patient ID: Jeffrey Serrano, male   DOB: 1950-06-15, 69 y.o.   MRN: [id number]  Reason for Consult: New Patient (Initial Visit)   Referred by Wendie Agreste, MD  Subjective:     HPI:  Jeffrey Serrano is a 69 y.o. male history of hypertension and diagnosed with peripheral vascular disease.  He has very short distance claudication states that he stops 3 times in 1 block.  Mostly has foot pain.  He does not have tissue loss or ulceration but does have some skin color rubor of the right foot greater than the left foot.  He also has unhealthy toenails bilaterally.  He does not take any aspirin or statin.  He does not take blood thinners.  He denies any previous history of vascular intervention.  Past Medical History:  Diagnosis Date  . Hepatitis C   . Hypertension   . PVD (peripheral vascular disease) ([place])   . Seizures ([place])    most recent Jan 2020   Family History  Problem Relation Age of Onset  . Seizures Father    No past surgical history on file.  Short Social History:  Social History   Tobacco Use  . Smoking status: Former Smoker    Quit date: 03/26/1988    Years since quitting: 31.8  . Smokeless tobacco: Never Used  Substance Use Topics  . Alcohol use: No    No Known Allergies  Current Outpatient Medications  Medication Sig Dispense Refill  . Cholecalciferol (VITAMIN D3) 1000 units CAPS Take by mouth.    . gabapentin (NEURONTIN) 100 MG capsule TAKE 1 CAPSULE BY MOUTH ONCE DAILY IF NEEDED 30 capsule 0  . hydrochlorothiazide (HYDRODIURIL) 12.5 MG tablet Take 1 tablet (12.5 mg total) by mouth daily. 90 tablet 2  . HYDROcodone-acetaminophen (NORCO/VICODIN) 5-325 MG tablet Take 1 tablet by mouth every 6 (six) hours as needed for moderate pain. 10 tablet 0  . ibuprofen (ADVIL,MOTRIN) 200 MG tablet Take 200 mg by mouth every 6 (six) hours as needed.    . Lacosamide (VIMPAT) 150 MG TABS Take 1 tablet (150 mg total) by mouth in the morning. 30 tablet 5  . lacosamide (VIMPAT)  200 MG TABS tablet Take 1 tablet (200 mg total) by mouth at bedtime. 30 tablet 5  . lisinopril (ZESTRIL) 10 MG tablet Take 1 tablet (10 mg total) by mouth daily. 90 tablet 2  . triamcinolone cream (KENALOG) 0.1 % APPLY TO AFFECTED AREA TWICE A DAY 30 g 0   No current facility-administered medications for this visit.    Review of Systems  Constitutional:  Constitutional negative. HENT: HENT negative.  Eyes: Eyes negative.  Respiratory: Respiratory negative.  Cardiovascular: Positive for claudication.  GI: Gastrointestinal negative.  Musculoskeletal: Musculoskeletal negative. Positive for leg pain.  Skin: Skin negative.  Neurological: Neurological negative. Hematologic: Hematologic/lymphatic negative.  Psychiatric: Psychiatric negative.        Objective:  Objective  Vitals:   01/21/20 1025  BP: (!) 149/99  Pulse: 68  Resp: 20  Temp: 97.8 F (36.6 C)  SpO2: 97%      Physical Exam HENT:     Head: Normocephalic.     Nose:     Comments: Wearing a mask Eyes:     Pupils: Pupils are equal, round, and reactive to light.  Cardiovascular:     Rate and Rhythm: Normal rate.     Pulses:          Radial pulses are 2+ on the right side  and 2+ on the left side.       Femoral pulses are 0 on the right side and 0 on the left side.      Popliteal pulses are 0 on the right side and 0 on the left side.  Abdominal:     General: Abdomen is flat.     Palpations: Abdomen is soft.  Musculoskeletal:        General: No swelling. Normal range of motion.     Cervical back: Normal range of motion and neck supple.     Comments: Rubor of the right greater than left foot  Skin:    General: Skin is dry.     Capillary Refill: Capillary refill takes less than 2 seconds.  Neurological:     General: No focal deficit present.     Mental Status: He is alert.  Psychiatric:        Mood and Affect: Mood normal.        Behavior: Behavior normal.        Thought Content: Thought content normal.         Judgment: Judgment normal.     Data: I have independently interpreted his ABIs to be 0.61 right and 0.67 the left.  These are monophasic bilaterally.  Toe pressure on the right is zero and on the left is noncompressible 254.     Assessment/Plan:     69 year old male here with very short distance right greater than left claudication.  He also has rubor of his right foot greater than left particularly the toes this is concerning for early ischemic changes.  I have recommended angiography.  I cannot feel strong femoral pulses however he does have renal insufficiency.  We will plan for angiography this may need to be from a brachial approach on a Monday in the very near future.  I have asked him to start taking aspirin.  I will start statin after the procedure.     Waynetta Sandy MD [facility]

## 2020-01-21 NOTE — H&P (View-Only) (Signed)
Patient ID: Jeffrey Serrano, male   DOB: 09/06/1950, 69 y.o.   MRN: [id number]  Reason for Consult: New Patient (Initial Visit)   Referred by Wendie Agreste, MD  Subjective:     HPI:  Jeffrey Serrano is a 69 y.o. male history of hypertension and diagnosed with peripheral vascular disease.  He has very short distance claudication states that he stops 3 times in 1 block.  Mostly has foot pain.  He does not have tissue loss or ulceration but does have some skin color rubor of the right foot greater than the left foot.  He also has unhealthy toenails bilaterally.  He does not take any aspirin or statin.  He does not take blood thinners.  He denies any previous history of vascular intervention.  Past Medical History:  Diagnosis Date  . Hepatitis C   . Hypertension   . PVD (peripheral vascular disease) ([place])   . Seizures ([place])    most recent Jan 2020   Family History  Problem Relation Age of Onset  . Seizures Father    No past surgical history on file.  Short Social History:  Social History   Tobacco Use  . Smoking status: Former Smoker    Quit date: 03/26/1988    Years since quitting: 31.8  . Smokeless tobacco: Never Used  Substance Use Topics  . Alcohol use: No    No Known Allergies  Current Outpatient Medications  Medication Sig Dispense Refill  . Cholecalciferol (VITAMIN D3) 1000 units CAPS Take by mouth.    . gabapentin (NEURONTIN) 100 MG capsule TAKE 1 CAPSULE BY MOUTH ONCE DAILY IF NEEDED 30 capsule 0  . hydrochlorothiazide (HYDRODIURIL) 12.5 MG tablet Take 1 tablet (12.5 mg total) by mouth daily. 90 tablet 2  . HYDROcodone-acetaminophen (NORCO/VICODIN) 5-325 MG tablet Take 1 tablet by mouth every 6 (six) hours as needed for moderate pain. 10 tablet 0  . ibuprofen (ADVIL,MOTRIN) 200 MG tablet Take 200 mg by mouth every 6 (six) hours as needed.    . Lacosamide (VIMPAT) 150 MG TABS Take 1 tablet (150 mg total) by mouth in the morning. 30 tablet 5  . lacosamide (VIMPAT)  200 MG TABS tablet Take 1 tablet (200 mg total) by mouth at bedtime. 30 tablet 5  . lisinopril (ZESTRIL) 10 MG tablet Take 1 tablet (10 mg total) by mouth daily. 90 tablet 2  . triamcinolone cream (KENALOG) 0.1 % APPLY TO AFFECTED AREA TWICE A DAY 30 g 0   No current facility-administered medications for this visit.    Review of Systems  Constitutional:  Constitutional negative. HENT: HENT negative.  Eyes: Eyes negative.  Respiratory: Respiratory negative.  Cardiovascular: Positive for claudication.  GI: Gastrointestinal negative.  Musculoskeletal: Musculoskeletal negative. Positive for leg pain.  Skin: Skin negative.  Neurological: Neurological negative. Hematologic: Hematologic/lymphatic negative.  Psychiatric: Psychiatric negative.        Objective:  Objective  Vitals:   01/21/20 1025  BP: (!) 149/99  Pulse: 68  Resp: 20  Temp: 97.8 F (36.6 C)  SpO2: 97%      Physical Exam HENT:     Head: Normocephalic.     Nose:     Comments: Wearing a mask Eyes:     Pupils: Pupils are equal, round, and reactive to light.  Cardiovascular:     Rate and Rhythm: Normal rate.     Pulses:          Radial pulses are 2+ on the right side  and 2+ on the left side.       Femoral pulses are 0 on the right side and 0 on the left side.      Popliteal pulses are 0 on the right side and 0 on the left side.  Abdominal:     General: Abdomen is flat.     Palpations: Abdomen is soft.  Musculoskeletal:        General: No swelling. Normal range of motion.     Cervical back: Normal range of motion and neck supple.     Comments: Rubor of the right greater than left foot  Skin:    General: Skin is dry.     Capillary Refill: Capillary refill takes less than 2 seconds.  Neurological:     General: No focal deficit present.     Mental Status: He is alert.  Psychiatric:        Mood and Affect: Mood normal.        Behavior: Behavior normal.        Thought Content: Thought content normal.         Judgment: Judgment normal.     Data: I have independently interpreted his ABIs to be 0.61 right and 0.67 the left.  These are monophasic bilaterally.  Toe pressure on the right is zero and on the left is noncompressible 254.     Assessment/Plan:     69 year old male here with very short distance right greater than left claudication.  He also has rubor of his right foot greater than left particularly the toes this is concerning for early ischemic changes.  I have recommended angiography.  I cannot feel strong femoral pulses however he does have renal insufficiency.  We will plan for angiography this may need to be from a brachial approach on a Monday in the very near future.  I have asked him to start taking aspirin.  I will start statin after the procedure.     Waynetta Sandy MD [facility]

## 2020-01-22 ENCOUNTER — Other Ambulatory Visit (HOSPITAL_COMMUNITY)
Admission: RE | Admit: 2020-01-22 | Discharge: 2020-01-22 | Disposition: A | Discharge status: Home / Self Care | Payer: PPO | Source: Ambulatory Visit | Attending: Vascular Surgery | Admitting: Vascular Surgery

## 2020-01-22 DIAGNOSIS — Z01812 Encounter for preprocedural laboratory examination: Secondary | ICD-10-CM | POA: Diagnosis not present

## 2020-01-22 DIAGNOSIS — Z20822 Contact with and (suspected) exposure to covid-19: Secondary | ICD-10-CM | POA: Insufficient documentation

## 2020-01-22 LAB — SARS CORONAVIRUS 2 (TAT 6-24 HRS): SARS Coronavirus 2: NEGATIVE

## 2020-01-24 ENCOUNTER — Other Ambulatory Visit: Payer: Self-pay

## 2020-01-24 ENCOUNTER — Observation Stay (HOSPITAL_COMMUNITY)
Admission: RE | Admit: 2020-01-24 | Discharge: 2020-01-25 | Disposition: A | Discharge status: Home / Self Care | Payer: PPO | Attending: Vascular Surgery | Admitting: Vascular Surgery

## 2020-01-24 ENCOUNTER — Encounter (HOSPITAL_COMMUNITY)
Admission: RE | Disposition: A | Discharge status: Home / Self Care | Payer: Self-pay | Source: Home / Self Care | Attending: Vascular Surgery

## 2020-01-24 DIAGNOSIS — I739 Peripheral vascular disease, unspecified: Secondary | ICD-10-CM | POA: Diagnosis not present

## 2020-01-24 DIAGNOSIS — Z79899 Other long term (current) drug therapy: Secondary | ICD-10-CM | POA: Diagnosis not present

## 2020-01-24 DIAGNOSIS — Z87891 Personal history of nicotine dependence: Secondary | ICD-10-CM | POA: Diagnosis not present

## 2020-01-24 DIAGNOSIS — I10 Essential (primary) hypertension: Secondary | ICD-10-CM | POA: Diagnosis not present

## 2020-01-24 DIAGNOSIS — I70223 Atherosclerosis of native arteries of extremities with rest pain, bilateral legs: Secondary | ICD-10-CM | POA: Diagnosis not present

## 2020-01-24 HISTORY — PX: PERIPHERAL VASCULAR INTERVENTION: CATH118257

## 2020-01-24 HISTORY — PX: ABDOMINAL AORTOGRAM W/LOWER EXTREMITY: CATH118223

## 2020-01-24 LAB — POCT I-STAT, CHEM 8
BUN: 12 mg/dL (ref 8–23)
Calcium, Ion: 1.18 mmol/L (ref 1.15–1.40)
Chloride: 101 mmol/L (ref 98–111)
Creatinine, Ser: 1.4 mg/dL — ABNORMAL HIGH (ref 0.61–1.24)
Glucose, Bld: 118 mg/dL — ABNORMAL HIGH (ref 70–99)
HCT: 47 % (ref 39.0–52.0)
Hemoglobin: 16 g/dL (ref 13.0–17.0)
Potassium: 3.9 mmol/L (ref 3.5–5.1)
Sodium: 136 mmol/L (ref 135–145)
TCO2: 25 mmol/L (ref 22–32)

## 2020-01-24 LAB — COMPREHENSIVE METABOLIC PANEL
ALT: 12 U/L (ref 0–44)
AST: 20 U/L (ref 15–41)
Albumin: 3.2 g/dL — ABNORMAL LOW (ref 3.5–5.0)
Alkaline Phosphatase: 51 U/L (ref 38–126)
Anion gap: 11 (ref 5–15)
BUN: 10 mg/dL (ref 8–23)
CO2: 22 mmol/L (ref 22–32)
Calcium: 8.7 mg/dL — ABNORMAL LOW (ref 8.9–10.3)
Chloride: 102 mmol/L (ref 98–111)
Creatinine, Ser: 1.27 mg/dL — ABNORMAL HIGH (ref 0.61–1.24)
GFR, Estimated: >60 mL/min (ref >60)
Glucose, Bld: 138 mg/dL — ABNORMAL HIGH (ref 70–99)
Potassium: 3.8 mmol/L (ref 3.5–5.1)
Sodium: 135 mmol/L (ref 135–145)
Total Bilirubin: 0.5 mg/dL (ref 0.3–1.2)
Total Protein: 6.2 g/dL — ABNORMAL LOW (ref 6.5–8.1)

## 2020-01-24 LAB — CBC
HCT: 42.3 % (ref 39.0–52.0)
Hemoglobin: 13.8 g/dL (ref 13.0–17.0)
MCH: 30.5 pg (ref 26.0–34.0)
MCHC: 32.6 g/dL (ref 30.0–36.0)
MCV: 93.6 fL (ref 80.0–100.0)
Platelets: 223 10*3/uL (ref 150–400)
RBC: 4.52 MIL/uL (ref 4.22–5.81)
RDW: 12.9 % (ref 11.5–15.5)
WBC: 12.2 10*3/uL — ABNORMAL HIGH (ref 4.0–10.5)
nRBC: 0 % (ref 0.0–0.2)

## 2020-01-24 LAB — GLUCOSE, CAPILLARY: Glucose-Capillary: 83 mg/dL (ref 70–99)

## 2020-01-24 LAB — POCT ACTIVATED CLOTTING TIME
Activated Clotting Time: 241 seconds
Activated Clotting Time: 186 seconds

## 2020-01-24 SURGERY — ABDOMINAL AORTOGRAM W/LOWER EXTREMITY
Anesthesia: LOCAL | Laterality: Right

## 2020-01-24 MED ORDER — METOPROLOL TARTRATE 5 MG/5ML IV SOLN: 2.0000 mg | INTRAVENOUS | Status: DC | PRN

## 2020-01-24 MED ORDER — OXYCODONE HCL 5 MG PO TABS: 5.0000 mg | ORAL_TABLET | ORAL | Status: DC | PRN

## 2020-01-24 MED ORDER — VITAMIN D 25 MCG (1000 UNIT) PO TABS
1000.0000 [IU] | ORAL_TABLET | Freq: Every day | ORAL | Status: DC
  Administered 2020-01-25: 1000 [IU] via ORAL
  Filled 2020-01-24: qty 1

## 2020-01-24 MED ORDER — SODIUM CHLORIDE 0.9% FLUSH
3.0000 mL | Freq: Two times a day (BID) | INTRAVENOUS | Status: DC
  Administered 2020-01-25: 3 mL via INTRAVENOUS

## 2020-01-24 MED ORDER — ONDANSETRON HCL 4 MG/2ML IJ SOLN: 4.0000 mg | Freq: Four times a day (QID) | INTRAMUSCULAR | Status: DC | PRN

## 2020-01-24 MED ORDER — HEPARIN (PORCINE) IN NACL 1000-0.9 UT/500ML-% IV SOLN
INTRAVENOUS | Status: DC | PRN
  Administered 2020-01-24 (×2): 500 mL

## 2020-01-24 MED ORDER — MORPHINE SULFATE (PF) 2 MG/ML IV SOLN: 2.0000 mg | INTRAVENOUS | Status: DC | PRN

## 2020-01-24 MED ORDER — HEPARIN SODIUM (PORCINE) 1000 UNIT/ML IJ SOLN
INTRAMUSCULAR | Status: DC | PRN
  Administered 2020-01-24: 7000 [IU] via INTRAVENOUS

## 2020-01-24 MED ORDER — LABETALOL HCL 5 MG/ML IV SOLN: 10.0000 mg | INTRAVENOUS | Status: DC | PRN

## 2020-01-24 MED ORDER — CLOPIDOGREL BISULFATE 75 MG PO TABS: 300.0000 mg | ORAL_TABLET | Freq: Once | ORAL | Status: DC

## 2020-01-24 MED ORDER — IODIXANOL 320 MG/ML IV SOLN
INTRAVENOUS | Status: DC | PRN
  Administered 2020-01-24: 95 mL via INTRA_ARTERIAL

## 2020-01-24 MED ORDER — SODIUM CHLORIDE 0.9 % IV SOLN: INTRAVENOUS | Status: DC

## 2020-01-24 MED ORDER — HYDROCHLOROTHIAZIDE 25 MG PO TABS
12.5000 mg | ORAL_TABLET | Freq: Every day | ORAL | Status: DC
  Administered 2020-01-25: 12.5 mg via ORAL
  Filled 2020-01-24: qty 1

## 2020-01-24 MED ORDER — GABAPENTIN 100 MG PO CAPS
100.0000 mg | ORAL_CAPSULE | Freq: Every day | ORAL | Status: DC
  Administered 2020-01-25: 100 mg via ORAL
  Filled 2020-01-24: qty 1

## 2020-01-24 MED ORDER — CLOPIDOGREL BISULFATE 75 MG PO TABS
75.0000 mg | ORAL_TABLET | Freq: Every day | ORAL | Status: DC
  Administered 2020-01-25: 75 mg via ORAL
  Filled 2020-01-24: qty 1

## 2020-01-24 MED ORDER — CLOPIDOGREL BISULFATE 300 MG PO TABS
ORAL_TABLET | ORAL | Status: DC | PRN
  Administered 2020-01-24: 300 mg via ORAL

## 2020-01-24 MED ORDER — OXYCODONE HCL 5 MG PO TABS
5.0000 mg | ORAL_TABLET | ORAL | Status: DC | PRN
  Administered 2020-01-24: 5 mg via ORAL
  Filled 2020-01-24: qty 1

## 2020-01-24 MED ORDER — GUAIFENESIN-DM 100-10 MG/5ML PO SYRP: 15.0000 mL | ORAL_SOLUTION | ORAL | Status: DC | PRN

## 2020-01-24 MED ORDER — MIDAZOLAM HCL 2 MG/2ML IJ SOLN
INTRAMUSCULAR | Status: AC
  Filled 2020-01-24: qty 2

## 2020-01-24 MED ORDER — HEPARIN SODIUM (PORCINE) 5000 UNIT/ML IJ SOLN
5000.0000 [IU] | Freq: Three times a day (TID) | INTRAMUSCULAR | Status: DC
  Administered 2020-01-24: 5000 [IU] via SUBCUTANEOUS
  Filled 2020-01-24: qty 1

## 2020-01-24 MED ORDER — ALUM & MAG HYDROXIDE-SIMETH 200-200-20 MG/5ML PO SUSP: 15.0000 mL | ORAL | Status: DC | PRN

## 2020-01-24 MED ORDER — PHENOL 1.4 % MT LIQD: 1.0000 | OROMUCOSAL | Status: DC | PRN

## 2020-01-24 MED ORDER — CLOPIDOGREL BISULFATE 300 MG PO TABS
ORAL_TABLET | ORAL | Status: AC
  Filled 2020-01-24: qty 1

## 2020-01-24 MED ORDER — FENTANYL CITRATE (PF) 100 MCG/2ML IJ SOLN
INTRAMUSCULAR | Status: DC | PRN
  Administered 2020-01-24: 25 ug via INTRAVENOUS

## 2020-01-24 MED ORDER — ROSUVASTATIN CALCIUM 10 MG PO TABS: 10.0000 mg | ORAL_TABLET | Freq: Every day | ORAL | 11 refills | Status: DC

## 2020-01-24 MED ORDER — HYDROCODONE-ACETAMINOPHEN 5-325 MG PO TABS: 1.0000 | ORAL_TABLET | Freq: Four times a day (QID) | ORAL | Status: DC | PRN

## 2020-01-24 MED ORDER — HYDRALAZINE HCL 20 MG/ML IJ SOLN: 5.0000 mg | INTRAMUSCULAR | Status: DC | PRN

## 2020-01-24 MED ORDER — CLOPIDOGREL BISULFATE 75 MG PO TABS: 75.0000 mg | ORAL_TABLET | Freq: Every day | ORAL | 11 refills | Status: AC

## 2020-01-24 MED ORDER — LACOSAMIDE 50 MG PO TABS
200.0000 mg | ORAL_TABLET | Freq: Every day | ORAL | Status: DC
  Administered 2020-01-24: 200 mg via ORAL
  Filled 2020-01-24: qty 4

## 2020-01-24 MED ORDER — ACETAMINOPHEN 325 MG PO TABS
650.0000 mg | ORAL_TABLET | ORAL | Status: DC | PRN
  Administered 2020-01-24: 650 mg via ORAL

## 2020-01-24 MED ORDER — POTASSIUM CHLORIDE CRYS ER 20 MEQ PO TBCR: 20.0000 meq | EXTENDED_RELEASE_TABLET | Freq: Once | ORAL | Status: DC

## 2020-01-24 MED ORDER — FENTANYL CITRATE (PF) 100 MCG/2ML IJ SOLN
INTRAMUSCULAR | Status: AC
  Filled 2020-01-24: qty 2

## 2020-01-24 MED ORDER — ROSUVASTATIN CALCIUM 5 MG PO TABS
10.0000 mg | ORAL_TABLET | Freq: Every day | ORAL | Status: DC
  Administered 2020-01-25: 10 mg via ORAL
  Filled 2020-01-24: qty 2

## 2020-01-24 MED ORDER — SODIUM CHLORIDE 0.9 % IV SOLN: 250.0000 mL | INTRAVENOUS | Status: DC | PRN

## 2020-01-24 MED ORDER — LIDOCAINE HCL (PF) 1 % IJ SOLN
INTRAMUSCULAR | Status: AC
  Filled 2020-01-24: qty 30

## 2020-01-24 MED ORDER — PANTOPRAZOLE SODIUM 40 MG PO TBEC
40.0000 mg | DELAYED_RELEASE_TABLET | Freq: Every day | ORAL | Status: DC
  Administered 2020-01-24 – 2020-01-25 (×2): 40 mg via ORAL
  Filled 2020-01-24 (×2): qty 1

## 2020-01-24 MED ORDER — HEPARIN (PORCINE) IN NACL 1000-0.9 UT/500ML-% IV SOLN
INTRAVENOUS | Status: AC
  Filled 2020-01-24: qty 1000

## 2020-01-24 MED ORDER — ACETAMINOPHEN 325 MG PO TABS
ORAL_TABLET | ORAL | Status: AC
  Filled 2020-01-24: qty 2

## 2020-01-24 MED ORDER — LACOSAMIDE 50 MG PO TABS
150.0000 mg | ORAL_TABLET | Freq: Every morning | ORAL | Status: DC
  Administered 2020-01-25: 150 mg via ORAL
  Filled 2020-01-24: qty 3

## 2020-01-24 MED ORDER — MIDAZOLAM HCL 2 MG/2ML IJ SOLN
INTRAMUSCULAR | Status: DC | PRN
  Administered 2020-01-24: 1 mg via INTRAVENOUS

## 2020-01-24 MED ORDER — LIDOCAINE HCL (PF) 1 % IJ SOLN
INTRAMUSCULAR | Status: DC | PRN
  Administered 2020-01-24: 18 mL via INTRADERMAL

## 2020-01-24 MED ORDER — SODIUM CHLORIDE 0.9 % WEIGHT BASED INFUSION: 1.0000 mL/kg/h | INTRAVENOUS | Status: AC

## 2020-01-24 MED ORDER — HEPARIN SODIUM (PORCINE) 1000 UNIT/ML IJ SOLN
INTRAMUSCULAR | Status: AC
  Filled 2020-01-24: qty 1

## 2020-01-24 MED ORDER — SODIUM CHLORIDE 0.9% FLUSH: 3.0000 mL | INTRAVENOUS | Status: DC | PRN

## 2020-01-24 MED ORDER — ASPIRIN EC 81 MG PO TBEC
81.0000 mg | DELAYED_RELEASE_TABLET | Freq: Every day | ORAL | Status: DC
  Administered 2020-01-25: 81 mg via ORAL
  Filled 2020-01-24: qty 1

## 2020-01-24 MED ORDER — LISINOPRIL 10 MG PO TABS
10.0000 mg | ORAL_TABLET | Freq: Every day | ORAL | Status: DC
  Administered 2020-01-25: 10 mg via ORAL
  Filled 2020-01-24: qty 1

## 2020-01-24 SURGICAL SUPPLY — 16 items
BAG SNAP BAND KOVER 36X36 (MISCELLANEOUS) ×3 IMPLANT
BALLN MUSTANG 6X60X75 (BALLOONS) ×3
BALLOON MUSTANG 6X60X75 (BALLOONS) ×2 IMPLANT
CATH OMNI FLUSH 5F 65CM (CATHETERS) ×3 IMPLANT
COVER SWIFTLINK CONNECTOR (BAG) ×3 IMPLANT
GLIDEWIRE ADV .035X260CM (WIRE) ×3 IMPLANT
KIT ENCORE 26 ADVANTAGE (KITS) ×3 IMPLANT
KIT MICROPUNCTURE NIT STIFF (SHEATH) ×3 IMPLANT
KIT PV (KITS) ×3 IMPLANT
SHEATH PINNACLE 5F 10CM (SHEATH) ×3 IMPLANT
SHEATH PINNACLE ST 6F 45CM (SHEATH) ×3 IMPLANT
STENT ELUVIA 6X60X130 (Permanent Stent) ×3 IMPLANT
SYR MEDRAD MARK V 150ML (SYRINGE) ×3 IMPLANT
TRANSDUCER W/STOPCOCK (MISCELLANEOUS) ×3 IMPLANT
TRAY PV CATH (CUSTOM PROCEDURE TRAY) ×3 IMPLANT
WIRE BENTSON .035X145CM (WIRE) ×3 IMPLANT

## 2020-01-24 NOTE — Progress Notes (Signed)
Site area- left  Site Prior to Removal- 0   Pressure Applied For-  20 MInutes   Bedrest Beginning at - 1355   Manual- Yes   Patient Status During Pull- Stable    Post Pull Groin Site- 0   Post Pull Instructions Given- Yes   Post Pull Pulses Present- Yes    Dressing Applied- Tegaderm and Gauze Dressing    Comments:  doppler pulses

## 2020-01-24 NOTE — Interval H&P Note (Signed)
History and Physical Interval Note:  01/24/2020 8:59 AM  Jeffrey Serrano  has presented today for surgery, with the diagnosis of peripheral vascular disease.  The various methods of treatment have been discussed with the patient and family. After consideration of risks, benefits and other options for treatment, the patient has consented to  Procedure(s): ABDOMINAL AORTOGRAM W/LOWER EXTREMITY (N/A) as a surgical intervention.  The patient's history has been reviewed, patient examined, no change in status, stable for surgery.  I have reviewed the patient's chart and labs.  Questions were answered to the patient's satisfaction.     Servando Snare

## 2020-01-24 NOTE — Op Note (Signed)
    Patient name: Jeffrey Serrano MRN: [id number] DOB: [birth year] Sex: male  01/24/2020 Pre-operative Diagnosis: Bilateral lower extremity very short distance claudication Post-operative diagnosis:  Same Surgeon:  Eda Paschal. Donzetta Matters, MD Procedure Performed: 1.  Ultrasound-guided cannulation left common femoral artery 2.  Aortogram with bilateral lower extremity runoff 3.  Stent of right external iliac artery with 6 x 60 mm Elluvia drug-eluting stent 4.  Moderate sedation with fentanyl and Versed for 43 minutes  Indications: 69 year old male with recent history of very short distance claudication.  He had rubor to both feet I was concerned that he has early ischemic ulceration.  He is indicated for angiography possible invention on the right prior to the left.  Findings: The aorta is heavily diseased however there is no flow-limiting stenosis.  Left side common external iliac arteries are patent.  Right side external leg artery has approximately 80% stenosis.  After intervention this is resolved to 0%.  Bilateral common femoral arteries are heavily diseased and bilateral SFAs are occluded.  Right side popliteal above the knee reconstitutes TP trunk is occluded peroneal artery is the dominant runoff all the way to the ankle.  On the left side he has peroneal reconstitution does and prior to the ankle.  After stenting today hopefully we have improved the flow in the right.  We will consider him for right common femoral endarterectomy and femoral to peroneal artery bypass we will get vein mapping in the office.   Procedure:  The patient was identified in the holding area and taken to room 8.  The patient was then placed supine on the table and prepped and draped in the usual sterile fashion.  A time out was called.  Ultrasound was used to evaluate the left common femoral artery.  This was heavily calcified.  The area was anesthetized with 1% lidocaine.  The vessel was noted be patent.  We cannulated with  direct ultrasound visualization with micropuncture needle followed the wire sheath.  And images saved the permanent record.  We placed a Bentson wire we were able to watch this flow into the aorta.  We placed a 5 French sheath on the catheter to the level of L1 performed aortogram followed by lower extremity runoff including pelvic and gold shots.  With high stenosis in the right external leg artery we elected to intervene.  We placed a Glidewire advantage over the bifurcation confirmed intraluminal access to the common femoral artery.  We then placed a long 6 French sheath patient was fully heparinized.  We primarily deployed an [place] drug-eluting stent postdilated this with 6 mm balloon.  Completion demonstrated no residual stenosis.  Wire was removed sheath was retracted into the external iliac artery on the left will be pulled in postoperative holding.  He tolerated without any complication.   Contrast: 95cc  Trino Higinbotham C. Donzetta Matters, MD [facility] Office: [phone] Pager: [phone]

## 2020-01-24 NOTE — Progress Notes (Signed)
   After sheath removal from left groin patient had an episode of blank staring and possible syncope.  Does have history of seizures I discussed this with his brother who believes this likely was a seizure.  He has persistent relative hypotension mean arterial pressures have been around 65.  We will plan to admit for overnight observation.  I discussed with his brother who demonstrates good understanding.  Servando Snare

## 2020-01-25 ENCOUNTER — Encounter (HOSPITAL_COMMUNITY): Payer: Self-pay | Admitting: Vascular Surgery

## 2020-01-25 ENCOUNTER — Telehealth: Payer: Self-pay

## 2020-01-25 DIAGNOSIS — I739 Peripheral vascular disease, unspecified: Secondary | ICD-10-CM | POA: Diagnosis not present

## 2020-01-25 MED ORDER — ASPIRIN 81 MG PO TBEC: 81.0000 mg | DELAYED_RELEASE_TABLET | Freq: Every day | ORAL | 11 refills | Status: AC

## 2020-01-25 NOTE — Discharge Summary (Signed)
[facility] Discharge Summary   Patient ID:  Jeffrey Serrano MRN: [id number] DOB/AGE: Oct 20, 1950 69 y.o.  Admit date: 01/24/2020 Discharge date: 01/26/2020 Date of Surgery: 01/24/2020 Surgeon: Surgeon(s): Waynetta Sandy, MD  Admission Diagnosis: PAD (peripheral artery disease) [facility]) [I73.9]  Discharge Diagnoses:  PAD (peripheral artery disease) ([place]) [I73.9]  Secondary Diagnoses: Past Medical History:  Diagnosis Date  . Hepatitis C   . Hypertension   . PVD (peripheral vascular disease) ([place])   . Seizures ([place])    most recent Jan 2020    Procedure(s): ABDOMINAL AORTOGRAM W/LOWER EXTREMITY PERIPHERAL VASCULAR INTERVENTION  Discharged Condition: good  HPI: 69 year old male here with very short distance right greater than left claudication.  He also has rubor of his right foot greater than left particularly the toes this is concerning for early ischemic changes.  I have recommended angiography.  I cannot feel strong femoral pulses however he does have renal insufficiency.  We will plan for angiography this may need to be from a brachial approach on a Monday in the very near future.  I have asked him to start taking aspirin.  I will start statin after the procedure.  Hospital Course:  Jeffrey Serrano is a 69 y.o. male is S/P Bilateral Procedure(s): ABDOMINAL AORTOGRAM W/LOWER EXTREMITY PERIPHERAL VASCULAR INTERVENTION Findings: The aorta is heavily diseased however there is no flow-limiting stenosis.  Left side common external iliac arteries are patent.  Right side external leg artery has approximately 80% stenosis.  After intervention this is resolved to 0%.  Bilateral common femoral arteries are heavily diseased and bilateral SFAs are occluded.  Right side popliteal above the knee reconstitutes TP trunk is occluded peroneal artery is the dominant runoff all the way to the ankle.  On the left side he has peroneal reconstitution does and prior to the  ankle. After stenting today hopefully we have improved the flow in the right.  We will consider him for right common femoral endarterectomy and femoral to peroneal artery bypass we will get vein mapping in the office. Extubated: POD # 0 Pt. Ambulating, voiding and taking PO diet without difficulty. Pt pain controlled with PO pain meds. Labs as below Complications:patient developed syncopal episode as well as hypotension following the intervention. He was kept overnight in observation but remained stable for discharge the following day  POD#1, Okay for discharge today. No recurrent syncopal events and resolved hypotension. he will go home on Aspirin, statin and Plavix. He will follow-up in the office to discuss right lower extremity common femoral endarterectomy and bypass to the peroneal artery.  Vein mapping is ordered for the follow-up visit and he is to have cardiac clearance prior.  Consults: none     Significant Diagnostic Studies: CBC Lab Results  Component Value Date   WBC 12.2 (H) 01/24/2020   HGB 13.8 01/24/2020   HCT 42.3 01/24/2020   MCV 93.6 01/24/2020   PLT 223 01/24/2020    BMET    Component Value Date/Time   NA 135 01/24/2020 2117   NA 138 01/07/2020 1142   K 3.8 01/24/2020 2117   CL 102 01/24/2020 2117   CO2 22 01/24/2020 2117   GLUCOSE 138 (H) 01/24/2020 2117   BUN 10 01/24/2020 2117   BUN 11 01/07/2020 1142   CREATININE 1.27 (H) 01/24/2020 2117   CREATININE 1.42 (H) 12/20/2019 1421   CALCIUM 8.7 (L) 01/24/2020 2117   GFRNONAA >60 01/24/2020 2117   GFRNONAA 44 (L) 08/07/2016 1403   GFRAA 62  01/07/2020 1142   GFRAA 50 (L) 08/07/2016 1403   COAG Lab Results  Component Value Date   INR 1.1 06/11/2019   INR 1.0 08/07/2016     Disposition:  Discharge to :Home   Discharge Instructions    Discharge patient   Complete by: As directed    Discharge disposition: 01-Home or Self Care   Discharge patient date: 01/25/2020     Allergies as of 01/25/2020    No Known Allergies     Medication List    STOP taking these medications   ibuprofen 200 MG tablet Commonly known as: ADVIL     TAKE these medications   aspirin 81 MG EC tablet Take 1 tablet (81 mg total) by mouth daily. Swallow whole.   clopidogrel 75 MG tablet Commonly known as: Plavix Take 1 tablet (75 mg total) by mouth daily.   gabapentin 100 MG capsule Commonly known as: NEURONTIN TAKE 1 CAPSULE BY MOUTH ONCE DAILY IF NEEDED What changed:   how much to take  how to take this  when to take this  additional instructions   hydrochlorothiazide 12.5 MG tablet Commonly known as: HYDRODIURIL Take 1 tablet (12.5 mg total) by mouth daily.   HYDROcodone-acetaminophen 5-325 MG tablet Commonly known as: NORCO/VICODIN Take 1 tablet by mouth every 6 (six) hours as needed for moderate pain.   lacosamide 200 MG Tabs tablet Commonly known as: Vimpat Take 1 tablet (200 mg total) by mouth at bedtime.   Vimpat 150 MG Tabs Generic drug: Lacosamide Take 1 tablet (150 mg total) by mouth in the morning.   lisinopril 10 MG tablet Commonly known as: ZESTRIL Take 1 tablet (10 mg total) by mouth daily.   rosuvastatin 10 MG tablet Commonly known as: Crestor Take 1 tablet (10 mg total) by mouth daily.   Vitamin D3 25 MCG (1000 UT) Caps Take 1,000 Units by mouth daily.      Verbal and written Discharge instructions given to the patient. Wound care per Discharge AVS  Follow-up Information    Waynetta Sandy, MD Follow up.   Specialties: Vascular Surgery, Cardiology Why: 02/18/20. the office will contact the patient with their follow up appointment Contact information: [place] [address] [phone]               Signed: Karoline Caldwell 01/26/2020, 1:08 PM

## 2020-01-25 NOTE — Telephone Encounter (Signed)
Patient's brother called to ask when patient could return to work. I said if he was not having any problems or issues he could return on Thursday (he works nights) and he could not lift/push/pull anything over 10 pounds until Monday. Verbalized understanding.

## 2020-01-25 NOTE — Discharge Instructions (Signed)
  [facility]  Discharge Instructions  Lower Extremity Angiogram; Angioplasty/Stenting  Please refer to the following instructions for your post-procedure care. Your surgeon or physician assistant will discuss any changes with you.  Activity  Avoid lifting more than 8 pounds (1 gallons of milk) for 5 days after your procedure. You may walk as much as you can tolerate. It's OK to drive after 72 hours.  Bathing/Showering  You may shower the day after your procedure. If you have a bandage, you may remove it at 24- 48 hours. Clean your incision site with mild soap and water. Pat the area dry with a clean towel.  Diet  Resume your pre-procedure diet. There are no special food restrictions following this procedure. All patients with peripheral vascular disease should follow a low fat/low cholesterol diet. In order to heal from your surgery, it is CRITICAL to get adequate nutrition. Your body requires vitamins, minerals, and protein. Vegetables are the best source of vitamins and minerals. Vegetables also provide the perfect balance of protein. Processed food has little nutritional value, so try to avoid this.  Medications  Resume taking all of your medications unless your doctor tells you not to. If your incision is causing pain, you may take over-the-counter pain relievers such as acetaminophen (Tylenol)  Follow Up  Follow up will be arranged at the time of your procedure. You may have an office visit scheduled or may be scheduled for surgery. Ask your surgeon if you have any questions.  Please call us immediately for any of the following conditions: .Severe or worsening pain your legs or feet at rest or with walking. .Increased pain, redness, drainage at your groin puncture site. .Fever of 101 degrees or higher. .If you have any mild or slow bleeding from your puncture site: lie down, apply firm constant pressure over the area with a piece of gauze or a  clean wash cloth for 30 minutes- no peeking!, call 911 right away if you are still bleeding after 30 minutes, or if the bleeding is heavy and unmanageable.  Reduce your risk factors of vascular disease:  . Stop smoking. If you would like help call [facility] at 1-800-QUIT-NOW (1-800-784-8669) or [place] at 336-586-4000. . Manage your cholesterol . Maintain a desired weight . Control your diabetes . Keep your blood pressure down .  If you have any questions, please call the office at [phone] 

## 2020-01-25 NOTE — Progress Notes (Addendum)
  Progress Note    01/25/2020 8:01 AM 1 Day Post-Op  Subjective:  No complaints   Vitals:   01/25/20 0121 01/25/20 0459  BP: 127/77 124/86  Pulse:    Resp: 18 19  Temp: 98.6 F (37 C) 98 F (36.7 C)  SpO2: 100% 100%   Physical Exam: Cardiac:  regular Lungs:  Non labored Incisions: left common femoral access site with minimal drainage that is dried. No active bleeding. Soft without swelling or hematoma Extremities:  2+ femoral pulses bilaterally. Lower extremities well perfused and warm Neurologic: alert and oriented  CBC    Component Value Date/Time   WBC 12.2 (H) 01/24/2020 2117   RBC 4.52 01/24/2020 2117   HGB 13.8 01/24/2020 2117   HGB 17.0 12/09/2018 1606   HCT 42.3 01/24/2020 2117   HCT 47.8 12/09/2018 1606   PLT 223 01/24/2020 2117   PLT 243 12/09/2018 1606   MCV 93.6 01/24/2020 2117   MCV 94 12/09/2018 1606   MCH 30.5 01/24/2020 2117   MCHC 32.6 01/24/2020 2117   RDW 12.9 01/24/2020 2117   RDW 12.8 12/09/2018 1606   LYMPHSABS 4.1 (H) 12/09/2018 1606   MONOABS 1,161 (H) 08/07/2016 1403   EOSABS 0.1 12/09/2018 1606   BASOSABS 0.1 12/09/2018 1606    BMET    Component Value Date/Time   NA 135 01/24/2020 2117   NA 138 01/07/2020 1142   K 3.8 01/24/2020 2117   CL 102 01/24/2020 2117   CO2 22 01/24/2020 2117   GLUCOSE 138 (H) 01/24/2020 2117   BUN 10 01/24/2020 2117   BUN 11 01/07/2020 1142   CREATININE 1.27 (H) 01/24/2020 2117   CREATININE 1.42 (H) 12/20/2019 1421   CALCIUM 8.7 (L) 01/24/2020 2117   GFRNONAA >60 01/24/2020 2117   GFRNONAA 44 (L) 08/07/2016 1403   GFRAA 62 01/07/2020 1142   GFRAA 50 (L) 08/07/2016 1403    INR    Component Value Date/Time   INR 1.1 06/11/2019 1145     Intake/Output Summary (Last 24 hours) at 01/25/2020 0801 Last data filed at 01/25/2020 0130 Gross per 24 hour  Intake 1650 ml  Output 200 ml  Net 1450 ml     Assessment/Plan:  69 y.o. male is s/p Aortogram with bilateral lower extremity runoff, stenting  of the right external iliac artery 1 Day Post-Op. Was kept overnight in observation due to possible syncopal episode as well as hypotension. Pressures good overnight SBP >120. No recurrent syncopal like events. Hemodynamically stable. Stable for discharge home today. He will go home on Plavix and statin and Aspirin. He has follow up with Dr. Donzetta Matters on 02/18/20  DVT prophylaxis: sq Heparin  Karoline Caldwell, PA-C [facility] [phone] 01/25/2020 8:01 AM   I have independently interviewed and examined patient and agree with PA assessment and plan above.  Okay for discharge today.  He will follow-up in the office to discuss right lower extremity common femoral endarterectomy and bypass to the peroneal artery.  Vein mapping is ordered for the follow-up visit and he is to have cardiac clearance prior.  Ailine Hefferan C. Donzetta Matters, MD [facility] [place] Office: [phone] Pager: [phone]

## 2020-02-02 ENCOUNTER — Other Ambulatory Visit: Payer: Self-pay

## 2020-02-02 ENCOUNTER — Encounter: Payer: Self-pay | Admitting: Podiatry

## 2020-02-02 ENCOUNTER — Ambulatory Visit: Payer: PPO | Admitting: Podiatry

## 2020-02-02 DIAGNOSIS — B351 Tinea unguium: Secondary | ICD-10-CM

## 2020-02-02 DIAGNOSIS — L84 Corns and callosities: Secondary | ICD-10-CM

## 2020-02-02 DIAGNOSIS — M79675 Pain in left toe(s): Secondary | ICD-10-CM | POA: Diagnosis not present

## 2020-02-02 DIAGNOSIS — I739 Peripheral vascular disease, unspecified: Secondary | ICD-10-CM

## 2020-02-02 DIAGNOSIS — M79674 Pain in right toe(s): Secondary | ICD-10-CM | POA: Diagnosis not present

## 2020-02-02 DIAGNOSIS — M2041 Other hammer toe(s) (acquired), right foot: Secondary | ICD-10-CM

## 2020-02-02 DIAGNOSIS — M2042 Other hammer toe(s) (acquired), left foot: Secondary | ICD-10-CM

## 2020-02-06 NOTE — Progress Notes (Signed)
Subjective: Jeffrey Serrano is a 69 y.o. male patient seen today for at risk foot care. Patient has h/o PAD and corn(s) b/l 5th digits and callus(es) b/l feet and painful mycotic nails b/l.  Pain interferes with ambulation. Aggravating factors include wearing enclosed shoe gear.   He states he did see Vascular Surgery and will be having a procedure performed on his lower extremities. He voices no new pedal concerns on today's visit.  Per chart review, he had abdominal aortogram with LE intervention performed on 01/24/2020, by Dr. Servando Snare. A stent was placed in the right external iliac artery.  He also takes gabapentin for neuropathic pain.  Patient Active Problem List   Diagnosis Date Noted  . PAD (peripheral artery disease) ([place]) 01/24/2020  . Chronic hepatitis C without hepatic coma ([place]) 08/07/2016  . Seizure disorder ([place]) 05/22/2016  . DEPRESSION 09/10/2006  . Essential hypertension 09/10/2006  . PERIPHERAL VASCULAR DISEASE 09/10/2006  . Seizures ([place]) 09/10/2006    Current Outpatient Medications on File Prior to Visit  Medication Sig Dispense Refill  . aspirin EC 81 MG EC tablet Take 1 tablet (81 mg total) by mouth daily. Swallow whole. 30 tablet 11  . Cholecalciferol (VITAMIN D3) 1000 units CAPS Take 1,000 Units by mouth daily.     . clopidogrel (PLAVIX) 75 MG tablet Take 1 tablet (75 mg total) by mouth daily. 30 tablet 11  . gabapentin (NEURONTIN) 100 MG capsule TAKE 1 CAPSULE BY MOUTH ONCE DAILY IF NEEDED (Patient taking differently: Take 100 mg by mouth daily. ) 30 capsule 0  . hydrochlorothiazide (HYDRODIURIL) 12.5 MG tablet Take 1 tablet (12.5 mg total) by mouth daily. 90 tablet 2  . HYDROcodone-acetaminophen (NORCO/VICODIN) 5-325 MG tablet Take 1 tablet by mouth every 6 (six) hours as needed for moderate pain. 10 tablet 0  . Lacosamide (VIMPAT) 150 MG TABS Take 1 tablet (150 mg total) by mouth in the morning. 30 tablet 5  . lacosamide (VIMPAT) 200 MG TABS tablet Take 1  tablet (200 mg total) by mouth at bedtime. 30 tablet 5  . lisinopril (ZESTRIL) 10 MG tablet Take 1 tablet (10 mg total) by mouth daily. 90 tablet 2  . rosuvastatin (CRESTOR) 10 MG tablet Take 1 tablet (10 mg total) by mouth daily. 30 tablet 11   No current facility-administered medications on file prior to visit.    No Known Allergies  Objective: Physical Exam  General: Jeffrey Serrano is a pleasant 69 y.o. AA male, WD, WN in NAD. AAO x 3.   Vascular:  Capillary refill time to digits <4 seconds b/l lower extremities. Nonpalpable DP pulse(s) b/l lower extremities. Nonpalpable PT pulse(s) b/l lower extremities. Pedal hair absent. Lower extremity skin temperature gradient within normal limits. No edema noted b/l lower extremities. No ischemia or gangrene noted b/l lower extremities.  Dermatological:  Pedal skin is thin shiny, atrophic b/l lower extremities and R 2nd toe. No open wounds bilaterally. No interdigital macerations bilaterally. Toenails 1-5 left, R hallux, R 3rd toe, R 4th toe and R 5th toe elongated, discolored, dystrophic, thickened, and crumbly with subungual debris and tenderness to dorsal palpation. Anonychia noted R 2nd toe. Nailbed(s) epithelialized.  Hyperkeratotic lesion(s) L 5th toe, R 5th toe, submet head 5 left foot and submet head 5 right foot.  No erythema, no edema, no drainage, no fluctuance.   Musculoskeletal:  Normal muscle strength 5/5 to all lower extremity muscle groups bilaterally. No pain crepitus or joint limitation noted with ROM b/l. Adductovarus deformity  b/l 5th digits.  Neurological:  Protective sensation intact 5/5 intact bilaterally with 10g monofilament b/l. Vibratory sensation intact b/l.   BLE Dopplers/ABI study 01/21/2020: Summary: Right: Resting right ankle-brachial index indicates moderate right lower extremity arterial disease. Left: Resting left ankle-brachial index indicates moderate left lower extremity arterial disease. *See table(s)  above for measurements and observations. Electronically signed by Servando Snare MD on 01/21/2020 at 10:41:28 AM.   Assessment and Plan:  1. Pain due to onychomycosis of toenails of both feet   2. Corns   3. Acquired hammertoes of both feet   4. PAD (peripheral artery disease) (HCC)    -Examined patient. -Toenails 1-5 left, R hallux, R 3rd toe, R 4th toe and R 5th toe debrided in length and girth without iatrogenic bleeding with sterile nail nipper and dremel.   -Corn(s) L 5th toe and R 5th toe pared utilizing sterile scalpel blade without complication or incident. Total number debrided=2. -He is being followed closely by Dr. Servando Snare with [facility]. Had stent placement in right external iliac artery on 01/24/2020. -Patient to report any pedal injuries to medical professional immediately. -Patient to continue soft, supportive shoe gear daily. -Patient/POA to call should there be question/concern in the interim.  Return in about 3 months (around 05/04/2020) for toenail debridement w/corn(s)/callus(es).  Marzetta Board, DPM

## 2020-02-07 ENCOUNTER — Other Ambulatory Visit: Payer: Self-pay | Admitting: *Deleted

## 2020-02-07 DIAGNOSIS — I739 Peripheral vascular disease, unspecified: Secondary | ICD-10-CM

## 2020-02-14 ENCOUNTER — Encounter: Payer: Self-pay | Admitting: Internal Medicine

## 2020-02-14 ENCOUNTER — Ambulatory Visit: Payer: PPO | Admitting: Internal Medicine

## 2020-02-14 ENCOUNTER — Other Ambulatory Visit: Payer: Self-pay

## 2020-02-14 VITALS — BP 138/80 | HR 76 | Ht 68.0 in | Wt 159.0 lb

## 2020-02-14 DIAGNOSIS — I739 Peripheral vascular disease, unspecified: Secondary | ICD-10-CM | POA: Diagnosis not present

## 2020-02-14 DIAGNOSIS — Z01810 Encounter for preprocedural cardiovascular examination: Secondary | ICD-10-CM | POA: Insufficient documentation

## 2020-02-14 NOTE — Progress Notes (Signed)
Cardiology Office Note:    Date:  02/14/2020   ID:  Jeffrey Serrano, DOB December 19, 1950, MRN [id number]  PCP:  Wendie Agreste, MD  [place] Cardiologist:  No primary care provider on file.  [facility] Electrophysiologist:  None   Referring MD: Bobette Mo*   CC: Pre-op Consulted for the evaluation of preoperative cardiac risk stratification at the behest of Wendie Agreste, MD Planned for right common femoral endarterectomy and femoral to peroneal artery bypass- Dr. Servando Snare  History of Present Illness:    Jeffrey Serrano is a 69 y.o. male with a hx of HTN, PAD who prevents for cardiac risk stratification.  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  No chest pain, no chest pressure, no SOB, no DOE.  No PND or orthopnea.  No syncope (outside of seizures).  Notes leg pain with walking but able to do al ADLs and go to work.  No prior issues with surgery in the past, except seizure post procedure (has had seizures in the past).  Past Medical History:  Diagnosis Date  . Hepatitis C   . Hypertension   . PVD (peripheral vascular disease) ([place])   . Seizures ([place])    most recent Jan 2020    Past Surgical History:  Procedure Laterality Date  . ABDOMINAL AORTOGRAM W/LOWER EXTREMITY N/A 01/24/2020   Procedure: ABDOMINAL AORTOGRAM W/LOWER EXTREMITY;  Surgeon: Waynetta Sandy, MD;  Location: [place] [facility];  Service: Cardiovascular;  Laterality: N/A;  . PERIPHERAL VASCULAR INTERVENTION Right 01/24/2020   Procedure: PERIPHERAL VASCULAR INTERVENTION;  Surgeon: Waynetta Sandy, MD;  Location: [facility];  Service: Cardiovascular;  Laterality: Right;  external iliac    Current Medications: Current Meds  Medication Sig  . aspirin EC 81 MG EC tablet Take 1 tablet (81 mg total) by mouth daily. Swallow whole.  . Cholecalciferol (VITAMIN D3) 1000 units CAPS Take 1,000 Units by mouth daily.   . clopidogrel (PLAVIX) 75 MG  tablet Take 1 tablet (75 mg total) by mouth daily.  [name] gabapentin (NEURONTIN) 100 MG capsule Take 100 mg by mouth daily.  . hydrochlorothiazide (HYDRODIURIL) 12.5 MG tablet Take 1 tablet (12.5 mg total) by mouth daily.  [name] HYDROcodone-acetaminophen (NORCO/VICODIN) 5-325 MG tablet Take 1 tablet by mouth every 6 (six) hours as needed for moderate pain.  . Lacosamide (VIMPAT) 150 MG TABS Take 1 tablet (150 mg total) by mouth in the morning.  . lacosamide (VIMPAT) 200 MG TABS tablet Take 1 tablet (200 mg total) by mouth at bedtime.  [name] lisinopril (ZESTRIL) 10 MG tablet Take 1 tablet (10 mg total) by mouth daily.  . rosuvastatin (CRESTOR) 10 MG tablet Take 1 tablet (10 mg total) by mouth daily.     Allergies:   Patient has no known allergies.   Social History   Socioeconomic History  . Marital status: Legally Separated    Spouse name: Not on file  . Number of children: Not on file  . Years of education: Not on file  . 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 Smoking status: Former Smoker    Quit date: 03/26/1988    Years since quitting: 31.9  . Smokeless tobacco: Never Used  Vaping Use  . Vaping Use: Never used  Substance and Sexual Activity  . Alcohol use: No  . Drug use: No  . Sexual activity: Not on file  Other Topics Concern  . Not on file  Social History Narrative   04/13/18 living with brother, [name]   Social Determinants of Health   Financial Resource Strain:   . Difficulty of Paying Living Expenses: Not on file  Food Insecurity:   . Worried About Charity fundraiser in the Last Year: Not on file  . Ran Out of Food in the Last Year: Not on file  Transportation Needs:   . Lack of Transportation (Medical): Not on file  . Lack of Transportation (Non-Medical): Not on file  Physical Activity:   . Days of Exercise per Week: Not on file  . Minutes of Exercise per Session: Not on file  Stress:   . Feeling of  Stress : Not on file  Social Connections:   . Frequency of Communication with Friends and Family: Not on file  . Frequency of Social Gatherings with Friends and Family: Not on file  . Attends Religious Services: Not on file  . Active Member of Clubs or Organizations: Not on file  . Attends Archivist Meetings: Not on file  . Marital Status: Not on file     Family History: The patient's family history includes Seizures in his father. PAD in brother  ROS:   Please see the history of present illness.    All other systems reviewed and are negative.  EKGs/Labs/Other Studies Reviewed:    The following studies were reviewed today:  EKG:  EKG is ordered today.  The ekg ordered today demonstrates  02/14/20 SR 76 LVH 01/24/20 SR 68 LVH  Recent Labs: 01/24/2020: ALT 12; BUN 10; Creatinine, Ser 1.27; Hemoglobin 13.8; Platelets 223; Potassium 3.8; Sodium 135  Recent Lipid Panel    Component Value Date/Time   CHOL 152 06/26/2018 1125   TRIG 59 06/26/2018 1125   HDL 46 06/26/2018 1125   CHOLHDL 3.3 06/26/2018 1125   CHOLHDL 2.4 Ratio 07/21/2009 2014   VLDL 11 07/21/2009 2014   LDLCALC 94 06/26/2018 1125   Risk Assessment/Calculations:     RCRI- 1  Physical Exam:    VS:  BP 138/80   Pulse 76   Ht 5\' 8"  (1.727 m)   Wt 159 lb (72.1 kg)   SpO2 96%   BMI 24.18 kg/m     Wt Readings from Last 3 Encounters:  02/14/20 159 lb (72.1 kg)  01/25/20 160 lb 7.9 oz (72.8 kg)  01/21/20 159 lb (72.1 kg)     GEN: Well nourished, well developed in no acute distress HEENT: Normal NECK: No JVD; No carotid bruits LYMPHATICS: No lymphadenopathy CARDIAC: RRR, no murmurs, rubs, gallops RESPIRATORY:  Clear to auscultation without rales, wheezing or rhonchi  ABDOMEN: Soft, non-tender, non-distended MUSCULOSKELETAL:  No edema; No deformity  SKIN: Warm and dry, shiny skin bilaterally with no hair NEUROLOGIC:  Alert and oriented x 3 PSYCHIATRIC:  Normal affect   ASSESSMENT:    1.  Peripheral vascular disease, unspecified ([place])   2. Preoperative cardiovascular examination    PLAN:    In order of problems listed above:  Preoperative Risk Assessment - The Revised Cardiac Risk Index = 1 in the setting of his vascular surgery, which equates 0.9% estimated risk of perioperative myocardial infarction, pulmonary edema, ventricular fibrillation, cardiac arrest, or complete heart block.  - DASI score of 16.2 associated with 4.73 functional mets - No further cardiac testing is recommended prior to surgery.  - The patient may proceed to surgery at acceptable risk.   - Our service  is available as needed in the peri-operative period.    4-5 follow up unless new symptoms or abnormal test results warranting change in plan  Would be reasonable for APP Follow up    Shared Decision Making/Informed Consent     Patient amenable to go to surgery without further testing.   Medication Adjustments/Labs and Tests Ordered: Current medicines are reviewed at length with the patient today.  Concerns regarding medicines are outlined above.  Orders Placed This Encounter  Procedures  . EKG 12-Lead   No orders of the defined types were placed in this encounter.   Patient Instructions  Medication Instructions:  Your physician recommends that you continue on your current medications as directed. Please refer to the Current Medication list given to you today.  *If you need a refill on your cardiac medications before your next appointment, please call your pharmacy*   Lab Work: None   If you have labs (blood work) drawn today and your tests are completely normal, you will receive your results only by: [name] MyChart Message (if you have MyChart) OR . A paper copy in the mail If you have any lab test that is abnormal or we need to change your treatment, we will call you to review the results.   Testing/Procedures: None   Follow-Up: At [facility], you and your health needs are  our priority.  As part of our continuing mission to provide you with exceptional heart care, we have created designated Provider Care Teams.  These Care Teams include your primary Cardiologist (physician) and Advanced Practice Providers (APPs -  Physician Assistants and Nurse Practitioners) who all work together to provide you with the care you need, when you need it.  We recommend signing up for the patient portal called "MyChart".  Sign up information is provided on this After Visit Summary.  MyChart is used to connect with patients for Virtual Visits (Telemedicine).  Patients are able to view lab/test results, encounter notes, upcoming appointments, etc.  Non-urgent messages can be sent to your provider as well.   To learn more about what you can do with MyChart, go to [email or web address].    Your next appointment:   4-5 month(s)  The format for your next appointment:   In Person  Provider:   Rudean Haskell, MD   Other Instructions None      Signed, Werner Lean, MD  02/14/2020 4:17 PM    [place]

## 2020-02-14 NOTE — Patient Instructions (Signed)
Medication Instructions:  Your physician recommends that you continue on your current medications as directed. Please refer to the Current Medication list given to you today.  *If you need a refill on your cardiac medications before your next appointment, please call your pharmacy*   Lab Work: None   If you have labs (blood work) drawn today and your tests are completely normal, you will receive your results only by: [name] MyChart Message (if you have MyChart) OR . A paper copy in the mail If you have any lab test that is abnormal or we need to change your treatment, we will call you to review the results.   Testing/Procedures: None   Follow-Up: At [facility], you and your health needs are our priority.  As part of our continuing mission to provide you with exceptional heart care, we have created designated Provider Care Teams.  These Care Teams include your primary Cardiologist (physician) and Advanced Practice Providers (APPs -  Physician Assistants and Nurse Practitioners) who all work together to provide you with the care you need, when you need it.  We recommend signing up for the patient portal called "MyChart".  Sign up information is provided on this After Visit Summary.  MyChart is used to connect with patients for Virtual Visits (Telemedicine).  Patients are able to view lab/test results, encounter notes, upcoming appointments, etc.  Non-urgent messages can be sent to your provider as well.   To learn more about what you can do with MyChart, go to [email or web address].    Your next appointment:   4-5 month(s)  The format for your next appointment:   In Person  Provider:   Rudean Haskell, MD   Other Instructions None

## 2020-02-18 ENCOUNTER — Other Ambulatory Visit: Payer: Self-pay

## 2020-02-18 ENCOUNTER — Encounter: Payer: Self-pay | Admitting: Vascular Surgery

## 2020-02-18 ENCOUNTER — Ambulatory Visit (INDEPENDENT_AMBULATORY_CARE_PROVIDER_SITE_OTHER): Payer: PPO | Admitting: Vascular Surgery

## 2020-02-18 ENCOUNTER — Ambulatory Visit (HOSPITAL_COMMUNITY)
Admission: RE | Admit: 2020-02-18 | Discharge: 2020-02-18 | Disposition: A | Discharge status: Home / Self Care | Payer: PPO | Source: Ambulatory Visit | Attending: Vascular Surgery | Admitting: Vascular Surgery

## 2020-02-18 VITALS — BP 128/87 | HR 59 | Temp 97.6°F | Resp 20 | Ht 68.0 in | Wt 157.0 lb

## 2020-02-18 DIAGNOSIS — I739 Peripheral vascular disease, unspecified: Secondary | ICD-10-CM | POA: Diagnosis not present

## 2020-02-18 NOTE — H&P (View-Only) (Signed)
Patient ID: Jeffrey Serrano, male   DOB: 03-15-1951, 69 y.o.   MRN: [id number]  Reason for Consult: Post-op Follow-up   Referred by Wendie Agreste, MD  Subjective:     HPI:  Jeffrey Serrano is a 69 y.o. male with short distance life limiting claudication.  He has hypertension as a risk factor.  He is taking Plavix now status post stent of right external iliac artery.  He has not had any symptomatic relief.  He does have preischemic changes of his right toes.  He has had cardiac clearance.  Past Medical History:  Diagnosis Date  . Hepatitis C   . Hypertension   . PVD (peripheral vascular disease) ([place])   . Seizures ([place])    most recent Jan 2020   Family History  Problem Relation Age of Onset  . Seizures Father    Past Surgical History:  Procedure Laterality Date  . ABDOMINAL AORTOGRAM W/LOWER EXTREMITY N/A 01/24/2020   Procedure: ABDOMINAL AORTOGRAM W/LOWER EXTREMITY;  Surgeon: Waynetta Sandy, MD;  Location: [facility];  Service: Cardiovascular;  Laterality: N/A;  . PERIPHERAL VASCULAR INTERVENTION Right 01/24/2020   Procedure: PERIPHERAL VASCULAR INTERVENTION;  Surgeon: Waynetta Sandy, MD;  Location: [facility];  Service: Cardiovascular;  Laterality: Right;  external iliac    Short Social History:  Social History   Tobacco Use  . Smoking status: Former Smoker    Quit date: 03/26/1988    Years since quitting: 31.9  . Smokeless tobacco: Never Used  Substance Use Topics  . Alcohol use: No    No Known Allergies  Current Outpatient Medications  Medication Sig Dispense Refill  . aspirin EC 81 MG EC tablet Take 1 tablet (81 mg total) by mouth daily. Swallow whole. 30 tablet 11  . Cholecalciferol (VITAMIN D3) 1000 units CAPS Take 1,000 Units by mouth daily.     . clopidogrel (PLAVIX) 75 MG tablet Take 1 tablet (75 mg total) by mouth daily. 30 tablet 11  . gabapentin (NEURONTIN) 100 MG capsule Take 100 mg by mouth daily.    .  hydrochlorothiazide (HYDRODIURIL) 12.5 MG tablet Take 1 tablet (12.5 mg total) by mouth daily. 90 tablet 2  . HYDROcodone-acetaminophen (NORCO/VICODIN) 5-325 MG tablet Take 1 tablet by mouth every 6 (six) hours as needed for moderate pain. 10 tablet 0  . Lacosamide (VIMPAT) 150 MG TABS Take 1 tablet (150 mg total) by mouth in the morning. 30 tablet 5  . lacosamide (VIMPAT) 200 MG TABS tablet Take 1 tablet (200 mg total) by mouth at bedtime. 30 tablet 5  . lisinopril (ZESTRIL) 10 MG tablet Take 1 tablet (10 mg total) by mouth daily. 90 tablet 2  . rosuvastatin (CRESTOR) 10 MG tablet Take 1 tablet (10 mg total) by mouth daily. 30 tablet 11   No current facility-administered medications for this visit.    Review of Systems  Constitutional:  Constitutional negative. HENT: HENT negative.  Eyes: Eyes negative.  Respiratory: Respiratory negative.  Cardiovascular: Positive for claudication.  GI: Gastrointestinal negative.  Musculoskeletal: Positive for leg pain.  Skin: Skin negative.  Neurological: Neurological negative. Hematologic: Hematologic/lymphatic negative.  Psychiatric: Psychiatric negative.        Objective:  Objective   Vitals:   02/18/20 1447  BP: 128/87  Pulse: (!) 59  Resp: 20  Temp: 97.6 F (36.4 C)  SpO2: 98%  Weight: 157 lb (71.2 kg)  Height: 5\' 8"  (1.727 m)   Body mass index is 23.87  kg/m.  Physical Exam HENT:     Head: Normocephalic.     Nose:     Comments: Wearing a mask Eyes:     Extraocular Movements: Extraocular movements intact.     Pupils: Pupils are equal, round, and reactive to light.  Cardiovascular:     Rate and Rhythm: Normal rate.     Pulses:          Femoral pulses are 1+ on the right side and 2+ on the left side.      Popliteal pulses are 0 on the right side and 0 on the left side.  Pulmonary:     Effort: Pulmonary effort is normal.  Abdominal:     General: Abdomen is flat.     Palpations: Abdomen is soft. There is no mass.   Musculoskeletal:     Comments: Toes on the right are ruborous with cracking  Skin:    General: Skin is warm and dry.     Capillary Refill: Capillary refill takes more than 3 seconds.  Neurological:     General: No focal deficit present.     Mental Status: He is alert.  Psychiatric:        Mood and Affect: Mood normal.        Behavior: Behavior normal.        Thought Content: Thought content normal.        Judgment: Judgment normal.     Data: I reviewed his previous aortogram bilateral lower extremity runoff with he and his brother.  +---------------+-----------+----------------------+---------------+-------  ----+   RT Diameter RT Findings     GSV      LT Diameter LT  Findings     (cm)                       (cm)           +---------------+-----------+----------------------+---------------+-------  ----+     0.33           Saphenofemoral     0.38                             Junction                    +---------------+-----------+----------------------+---------------+-------  ----+     0.16           Proximal thigh     0.25           +---------------+-----------+----------------------+---------------+-------  ----+     0.14            Mid thigh       0.25           +---------------+-----------+----------------------+---------------+-------  ----+     0.16            Distal thigh      0.18           +---------------+-----------+----------------------+---------------+-------  ----+     0.19              Knee        0.17           +---------------+-----------+----------------------+---------------+-------  ----+     0.24            Prox calf       0.14            +---------------+-----------+----------------------+---------------+-------  ----+     0.23  Mid calf       0.23           +---------------+-----------+----------------------+---------------+-------  ----+     0.25            Distal calf      0.24           +---------------+-----------+----------------------+---------------+-------  ----+   +----------------+-----------+---------------+----------------+-----------+   RT diameter (cm)RT Findings   SSV   LT Diameter (cm)LT  Findings  +----------------+-----------+---------------+----------------+-----------+      0.16         Popliteal fossa   0.44            +----------------+-----------+---------------+----------------+-----------+      0.13          Proximal calf    0.25            +----------------+-----------+---------------+----------------+-----------+      0.21           Mid calf     0.28            +----------------+-----------+---------------+----------------+-----------+      0.21          Distal calf    0.26            +----------------+-----------+---------------+----------------+-----------+       Assessment/Plan:     69 year old male with the above-noted issues previously underwent angiography with stenting of his right external leg artery.  He continues Plavix did not have any symptomatic relief but does have a palpable right common femoral pulse now.  I recommended femoral to peroneal artery bypass does not appear to have suitable vein and will likely need graft.  He will continue Plavix.  He has been cleared by cardiology.  We will get him set up in the near future.     Waynetta Sandy MD [facility]

## 2020-02-18 NOTE — Progress Notes (Signed)
Patient ID: Jeffrey Serrano, male   DOB: 05-Mar-1951, 69 y.o.   MRN: [id number]  Reason for Consult: Post-op Follow-up   Referred by Wendie Agreste, MD  Subjective:     HPI:  Jeffrey Serrano is a 69 y.o. male with short distance life limiting claudication.  He has hypertension as a risk factor.  He is taking Plavix now status post stent of right external iliac artery.  He has not had any symptomatic relief.  He does have preischemic changes of his right toes.  He has had cardiac clearance.  Past Medical History:  Diagnosis Date  . Hepatitis C   . Hypertension   . PVD (peripheral vascular disease) ([place])   . Seizures ([place])    most recent Jan 2020   Family History  Problem Relation Age of Onset  . Seizures Father    Past Surgical History:  Procedure Laterality Date  . ABDOMINAL AORTOGRAM W/LOWER EXTREMITY N/A 01/24/2020   Procedure: ABDOMINAL AORTOGRAM W/LOWER EXTREMITY;  Surgeon: Waynetta Sandy, MD;  Location: [facility];  Service: Cardiovascular;  Laterality: N/A;  . PERIPHERAL VASCULAR INTERVENTION Right 01/24/2020   Procedure: PERIPHERAL VASCULAR INTERVENTION;  Surgeon: Waynetta Sandy, MD;  Location: [facility];  Service: Cardiovascular;  Laterality: Right;  external iliac    Short Social History:  Social History   Tobacco Use  . Smoking status: Former Smoker    Quit date: 03/26/1988    Years since quitting: 31.9  . Smokeless tobacco: Never Used  Substance Use Topics  . Alcohol use: No    No Known Allergies  Current Outpatient Medications  Medication Sig Dispense Refill  . aspirin EC 81 MG EC tablet Take 1 tablet (81 mg total) by mouth daily. Swallow whole. 30 tablet 11  . Cholecalciferol (VITAMIN D3) 1000 units CAPS Take 1,000 Units by mouth daily.     . clopidogrel (PLAVIX) 75 MG tablet Take 1 tablet (75 mg total) by mouth daily. 30 tablet 11  . gabapentin (NEURONTIN) 100 MG capsule Take 100 mg by mouth daily.    .  hydrochlorothiazide (HYDRODIURIL) 12.5 MG tablet Take 1 tablet (12.5 mg total) by mouth daily. 90 tablet 2  . HYDROcodone-acetaminophen (NORCO/VICODIN) 5-325 MG tablet Take 1 tablet by mouth every 6 (six) hours as needed for moderate pain. 10 tablet 0  . Lacosamide (VIMPAT) 150 MG TABS Take 1 tablet (150 mg total) by mouth in the morning. 30 tablet 5  . lacosamide (VIMPAT) 200 MG TABS tablet Take 1 tablet (200 mg total) by mouth at bedtime. 30 tablet 5  . lisinopril (ZESTRIL) 10 MG tablet Take 1 tablet (10 mg total) by mouth daily. 90 tablet 2  . rosuvastatin (CRESTOR) 10 MG tablet Take 1 tablet (10 mg total) by mouth daily. 30 tablet 11   No current facility-administered medications for this visit.    Review of Systems  Constitutional:  Constitutional negative. HENT: HENT negative.  Eyes: Eyes negative.  Respiratory: Respiratory negative.  Cardiovascular: Positive for claudication.  GI: Gastrointestinal negative.  Musculoskeletal: Positive for leg pain.  Skin: Skin negative.  Neurological: Neurological negative. Hematologic: Hematologic/lymphatic negative.  Psychiatric: Psychiatric negative.        Objective:  Objective   Vitals:   02/18/20 1447  BP: 128/87  Pulse: (!) 59  Resp: 20  Temp: 97.6 F (36.4 C)  SpO2: 98%  Weight: 157 lb (71.2 kg)  Height: 5\' 8"  (1.727 m)   Body mass index is 23.87  kg/m.  Physical Exam HENT:     Head: Normocephalic.     Nose:     Comments: Wearing a mask Eyes:     Extraocular Movements: Extraocular movements intact.     Pupils: Pupils are equal, round, and reactive to light.  Cardiovascular:     Rate and Rhythm: Normal rate.     Pulses:          Femoral pulses are 1+ on the right side and 2+ on the left side.      Popliteal pulses are 0 on the right side and 0 on the left side.  Pulmonary:     Effort: Pulmonary effort is normal.  Abdominal:     General: Abdomen is flat.     Palpations: Abdomen is soft. There is no mass.    Musculoskeletal:     Comments: Toes on the right are ruborous with cracking  Skin:    General: Skin is warm and dry.     Capillary Refill: Capillary refill takes more than 3 seconds.  Neurological:     General: No focal deficit present.     Mental Status: He is alert.  Psychiatric:        Mood and Affect: Mood normal.        Behavior: Behavior normal.        Thought Content: Thought content normal.        Judgment: Judgment normal.     Data: I reviewed his previous aortogram bilateral lower extremity runoff with he and his brother.  +---------------+-----------+----------------------+---------------+-------  ----+   RT Diameter RT Findings     GSV      LT Diameter LT  Findings     (cm)                       (cm)           +---------------+-----------+----------------------+---------------+-------  ----+     0.33           Saphenofemoral     0.38                             Junction                    +---------------+-----------+----------------------+---------------+-------  ----+     0.16           Proximal thigh     0.25           +---------------+-----------+----------------------+---------------+-------  ----+     0.14            Mid thigh       0.25           +---------------+-----------+----------------------+---------------+-------  ----+     0.16            Distal thigh      0.18           +---------------+-----------+----------------------+---------------+-------  ----+     0.19              Knee        0.17           +---------------+-----------+----------------------+---------------+-------  ----+     0.24            Prox calf       0.14            +---------------+-----------+----------------------+---------------+-------  ----+     0.23  Mid calf       0.23           +---------------+-----------+----------------------+---------------+-------  ----+     0.25            Distal calf      0.24           +---------------+-----------+----------------------+---------------+-------  ----+   +----------------+-----------+---------------+----------------+-----------+   RT diameter (cm)RT Findings   SSV   LT Diameter (cm)LT  Findings  +----------------+-----------+---------------+----------------+-----------+      0.16         Popliteal fossa   0.44            +----------------+-----------+---------------+----------------+-----------+      0.13          Proximal calf    0.25            +----------------+-----------+---------------+----------------+-----------+      0.21           Mid calf     0.28            +----------------+-----------+---------------+----------------+-----------+      0.21          Distal calf    0.26            +----------------+-----------+---------------+----------------+-----------+       Assessment/Plan:     69 year old male with the above-noted issues previously underwent angiography with stenting of his right external leg artery.  He continues Plavix did not have any symptomatic relief but does have a palpable right common femoral pulse now.  I recommended femoral to peroneal artery bypass does not appear to have suitable vein and will likely need graft.  He will continue Plavix.  He has been cleared by cardiology.  We will get him set up in the near future.     Waynetta Sandy MD [facility]

## 2020-03-07 ENCOUNTER — Encounter (HOSPITAL_COMMUNITY): Payer: Self-pay

## 2020-03-07 NOTE — Progress Notes (Signed)
[id number] [address], [place] [address] Phone: [phone] Fax: [phone]  [place], [place] [address] [place] [address] Phone: [phone] Fax: [place], [place] [address] Phone: [phone] Fax: [phone]  [place], [place] - [place] [address] Phone: [phone] Fax: [phone]      Your procedure is scheduled on Wednesday, December 29th.  Report to [facility] Main Entrance "A" at 5:30 A.M., and check in at the Admitting office.  Call this number if you have problems the morning of surgery:  [phone]  Call [phone] if you have any questions prior to your surgery date Monday-Friday 8am-4pm    Remember:  Do not eat or drink after midnight the night before your surgery     Take these medicines the morning of surgery with A SIP OF WATER   Gabapentin (Neurontin)  Hydrocodone-Acetaminophen (Norco) - if needed for pain  Lacosamide (Vimpat)  Rosuvastatin (Crestor)   Follow your surgeon's instructions on when to stop Plavix.  Stop (5) days prior to surgery.  Continue taking Aspirin 81 mg, as instructed by your surgeon.     As of today, STOP taking Aleve, Naproxen, Ibuprofen, Motrin, Advil, Goody's, BC's, all herbal medications, fish oil, and all vitamins.                      Do not wear jewelry            Do not wear lotions, powders, colognes, or deodorant.            Men may shave face and neck.            Do not bring valuables to the hospital.            [facility] is not responsible for any belongings or valuables.  Do NOT Smoke (Tobacco/Vaping) or drink  Alcohol 24 hours prior to your procedure If you use a CPAP at night, you may bring all equipment for your overnight stay.   Contacts, glasses, dentures or bridgework may not be worn into surgery.      For patients admitted to the hospital, discharge time will be determined by your treatment team.   Patients discharged the day of surgery will not be allowed to drive home, and someone needs to stay with them for 24 hours.    Special instructions:   [place]- Preparing For Surgery  Before surgery, you can play an important role. Because skin is not sterile, your skin needs to be as free of germs as possible. You can reduce the number of germs on your skin by washing with CHG (chlorahexidine gluconate) Soap before surgery.  CHG is an antiseptic cleaner which kills germs and bonds with the skin to continue killing germs even after washing.    Oral Hygiene is also important to reduce your risk of infection.  Remember - BRUSH YOUR TEETH THE MORNING OF SURGERY WITH YOUR REGULAR TOOTHPASTE  Please do not use if you have an allergy to CHG or antibacterial soaps. If your skin becomes reddened/irritated stop using the CHG.  Do not shave (including legs  and underarms) for at least 48 hours prior to first CHG shower. It is OK to shave your face.  Please follow these instructions carefully.   1. Shower the NIGHT BEFORE SURGERY and the MORNING OF SURGERY with CHG Soap.   2. If you chose to wash your hair, wash your hair first as usual with your normal shampoo.  3. After you shampoo, rinse your hair and body thoroughly to remove the shampoo.  4. Use CHG as you would any other liquid soap. You can apply CHG directly to the skin and wash gently with a scrungie or a clean washcloth.   5. Apply the CHG Soap to your body ONLY FROM THE NECK DOWN.  Do not use on open wounds or open sores. Avoid contact with your eyes, ears, mouth and genitals (private parts). Wash Face and genitals (private parts)  with  your normal soap.   6. Wash thoroughly, paying special attention to the area where your surgery will be performed.  7. Thoroughly rinse your body with warm water from the neck down.  8. DO NOT shower/wash with your normal soap after using and rinsing off the CHG Soap.  9. Pat yourself dry with a CLEAN TOWEL.  10. Wear CLEAN PAJAMAS to bed the night before surgery  11. Place CLEAN SHEETS on your bed the night of your first shower and DO NOT SLEEP WITH PETS.   Day of Surgery: Wear Clean/Comfortable clothing the morning of surgery Do not apply any deodorants/lotions.   Remember to brush your teeth WITH YOUR REGULAR TOOTHPASTE.   Please read over the following fact sheets that you were given.

## 2020-03-08 ENCOUNTER — Encounter (HOSPITAL_COMMUNITY)
Admission: RE | Admit: 2020-03-08 | Discharge: 2020-03-08 | Disposition: A | Discharge status: Home / Self Care | Payer: PPO | Source: Ambulatory Visit | Attending: Vascular Surgery | Admitting: Vascular Surgery

## 2020-03-08 ENCOUNTER — Other Ambulatory Visit: Payer: Self-pay

## 2020-03-08 ENCOUNTER — Other Ambulatory Visit (HOSPITAL_COMMUNITY): Payer: PPO

## 2020-03-08 ENCOUNTER — Encounter (HOSPITAL_COMMUNITY): Payer: Self-pay

## 2020-03-08 DIAGNOSIS — Z01812 Encounter for preprocedural laboratory examination: Secondary | ICD-10-CM | POA: Diagnosis not present

## 2020-03-08 HISTORY — DX: Unspecified osteoarthritis, unspecified site: M19.90

## 2020-03-08 HISTORY — DX: Pure hypercholesterolemia, unspecified: E78.00

## 2020-03-08 LAB — URINALYSIS, ROUTINE W REFLEX MICROSCOPIC
Bilirubin Urine: NEGATIVE
Glucose, UA: NEGATIVE mg/dL
Ketones, ur: NEGATIVE mg/dL
Leukocytes,Ua: NEGATIVE
Nitrite: NEGATIVE
Protein, ur: NEGATIVE mg/dL
Specific Gravity, Urine: 1.016 (ref 1.005–1.030)
pH: 5 (ref 5.0–8.0)

## 2020-03-08 LAB — APTT: aPTT: 29 seconds (ref 24–36)

## 2020-03-08 LAB — CBC
HCT: 45 % (ref 39.0–52.0)
Hemoglobin: 14.5 g/dL (ref 13.0–17.0)
MCH: 30.4 pg (ref 26.0–34.0)
MCHC: 32.2 g/dL (ref 30.0–36.0)
MCV: 94.3 fL (ref 80.0–100.0)
Platelets: 194 10*3/uL (ref 150–400)
RBC: 4.77 MIL/uL (ref 4.22–5.81)
RDW: 13.5 % (ref 11.5–15.5)
WBC: 7.1 10*3/uL (ref 4.0–10.5)
nRBC: 0 % (ref 0.0–0.2)

## 2020-03-08 LAB — COMPREHENSIVE METABOLIC PANEL
ALT: 12 U/L (ref 0–44)
AST: 19 U/L (ref 15–41)
Albumin: 3.9 g/dL (ref 3.5–5.0)
Alkaline Phosphatase: 60 U/L (ref 38–126)
Anion gap: 13 (ref 5–15)
BUN: 12 mg/dL (ref 8–23)
CO2: 22 mmol/L (ref 22–32)
Calcium: 9.2 mg/dL (ref 8.9–10.3)
Chloride: 100 mmol/L (ref 98–111)
Creatinine, Ser: 1.16 mg/dL (ref 0.61–1.24)
GFR, Estimated: >60 mL/min (ref >60)
Glucose, Bld: 96 mg/dL (ref 70–99)
Potassium: 3.8 mmol/L (ref 3.5–5.1)
Sodium: 135 mmol/L (ref 135–145)
Total Bilirubin: 1 mg/dL (ref 0.3–1.2)
Total Protein: 7.4 g/dL (ref 6.5–8.1)

## 2020-03-08 LAB — SURGICAL PCR SCREEN
MRSA, PCR: NEGATIVE
Staphylococcus aureus: NEGATIVE

## 2020-03-08 LAB — PROTIME-INR
INR: 1.1 (ref 0.8–1.2)
Prothrombin Time: 13.6 seconds (ref 11.4–15.2)

## 2020-03-08 NOTE — Progress Notes (Signed)
PCP - Dr. [name] Cardiologist - Patient denies  PPM/ICD - n/a Device Orders -  Rep Notified -   Chest x-ray - n/a EKG - 02/14/20 Stress Test - patient denies ECHO - patient denies Cardiac Cath - patient denies  Sleep Study - patient denies CPAP -   Fasting Blood Sugar - n/a Checks Blood Sugar _____ times a day  Blood Thinner Instructions:  Hold plavix for 5 days Aspirin Instructions: patient states he was taken off aspirin  ERAS Protcol - n/a PRE-SURGERY Ensure or G2-   COVID TEST- Tuesday 03/14/20   Anesthesia review: yes, cardiac clearance on 02/14/20; patient has history of seizures and patient states last seizure was 01/24/20  Patient denies shortness of breath, fever, cough and chest pain at PAT appointment   All instructions explained to the patient, with a verbal understanding of the material. Patient agrees to go over the instructions while at home for a better understanding. Patient also instructed to self quarantine after being tested for COVID-19. The opportunity to ask questions was provided.

## 2020-03-09 NOTE — Progress Notes (Addendum)
Anesthesia Chart Review:  Case: [id number] Date/Time: 03/15/20 0715   Procedure: BYPASS GRAFT RIGHT FEMORAL TO BELOW KNEE POPLITEAL ARTERY (Right )   Anesthesia type: General   Pre-op diagnosis: PAD   Location: [facility] OR ROOM 16 / [place] OR   Surgeons: Waynetta Sandy, MD      DISCUSSION: Patient is a 69 year old male scheduled for the above procedure.  History includes former smoker, HTN, PVD (right EIA stent 01/24/20), seizure disorder, Hepatitis C (s/p Mavyret treatment), hypercholesterolemia.   He had a cardiology preoperative evaluation by Daisy Floro, MD on 02/14/20. He wrote, "Preoperative Risk Assessment - The Revised Cardiac Risk Index = 1 in the setting of his vascular surgery, which equates 0.9% estimated risk of perioperative myocardial infarction, pulmonary edema, ventricular fibrillation, cardiac arrest, or complete heart block.  - DASI score of 16.2 associated with 4.73 functional mets - No further cardiac testing is recommended prior to surgery.  - The patient may proceed to surgery at acceptable risk.   - Our service is available as needed in the peri-operative period."  Neurologist is with [facility]. Last seizure 01/24/20. Vimpat increased in July.    He says he is no longer on ASA per his PCP. (He had been asked to avoid NSAIDS like Advil and Aleve at 01/07/20 visit due to mildly elevated Creatinine, but did not find specific instructions regarding ASA.) Patient reported instructions to hold Plavix for 5 days for surgery, but 02/18/20 note by Dr. Donzetta Matters suggests to continue. Staff message sent to Dr. Donzetta Matters to clarify. He in on Plavix due to EIA stent, so defer to vascular. (UPDATE 03/13/20 9:46 AM: Per Dr. Donzetta Matters, if patient is no longer on ASA, he should continue Plavix. Patient notified per [facility] staff.)  Presurgical COVID-19 test is scheduled for 03/14/2020.  Anesthesia team to evaluate on the day of surgery.   VS: BP (!) 145/89   Pulse 65    Temp 36.7 C   Resp 18   Ht 5\' 8"  (1.727 m)   Wt 71.8 kg   SpO2 100%   BMI 24.05 kg/m    PROVIDERS: Wendie Agreste, MD is PCP  - Daisy Floro, MD is cardiologist - [name], MD/[place], Janett Billow, NP is neurologist. Last visit 10/13/19.  Vimpat increased due to breakthrough seizure. Six month follow-up advised. Scharlene Gloss, MD/[place], Belenda Cruise, NP is ID. Last visit 12/20/19. Patient is s/p 8 weeks of Mavyret for genotype 1b chronic hepatitis C.Marland KitchenMarland KitchenEncouraged to establish with gastroenterology for initial endoscopy and recommend hepatocellular carcinoma screenings every 6 months with abdominal ultrasound with or without alpha-feroprotein.  Should he need screening in the future for hepatitis C a viral load/hepatitis C RNA level will be needed as the hepatitis C antibody test will always be positive." As needed follow-up.   LABS: Labs reviewed: Acceptable for surgery. (all labs ordered are listed, but only abnormal results are displayed)  Labs Reviewed  URINALYSIS, ROUTINE W REFLEX MICROSCOPIC - Abnormal; Notable for the following components:      Result Value   Hgb urine dipstick SMALL (*)    Bacteria, UA RARE (*)    All other components within normal limits  SURGICAL PCR SCREEN  CBC  COMPREHENSIVE METABOLIC PANEL  PROTIME-INR  APTT  TYPE AND SCREEN    EKG: 02/14/20: SR, LVH, LAD.   CV:  Aortogram with BLE runoff 01/24/20: S/p Stent of right external iliac artery with 6 x 60 mm Elluvia drug-eluting stent   Past Medical History:  Diagnosis  Date  . Arthritis   . Hepatitis C   . High cholesterol   . Hypertension   . PVD (peripheral vascular disease) ([place])   . Seizures ([place])    most recent Jan 2020    Past Surgical History:  Procedure Laterality Date  . ABDOMINAL AORTOGRAM W/LOWER EXTREMITY N/A 01/24/2020   Procedure: ABDOMINAL AORTOGRAM W/LOWER EXTREMITY;  Surgeon: Waynetta Sandy, MD;  Location: [facility];  Service: Cardiovascular;   Laterality: N/A;  . PERIPHERAL VASCULAR INTERVENTION Right 01/24/2020   Procedure: PERIPHERAL VASCULAR INTERVENTION;  Surgeon: Waynetta Sandy, MD;  Location: [facility];  Service: Cardiovascular;  Laterality: Right;  external iliac    MEDICATIONS: . aspirin EC 81 MG EC tablet  . Cholecalciferol (VITAMIN D3) 1000 units CAPS  . clopidogrel (PLAVIX) 75 MG tablet  . gabapentin (NEURONTIN) 100 MG capsule  . hydrochlorothiazide (HYDRODIURIL) 12.5 MG tablet  . HYDROcodone-acetaminophen (NORCO/VICODIN) 5-325 MG tablet  . ibuprofen (ADVIL) 200 MG tablet  . Lacosamide (VIMPAT) 150 MG TABS  . lacosamide (VIMPAT) 200 MG TABS tablet  . lisinopril (ZESTRIL) 10 MG tablet  . rosuvastatin (CRESTOR) 10 MG tablet   No current facility-administered medications for this encounter.    Myra Gianotti, PA-C Surgical Short Stay/Anesthesiology [facility] Phone [phone] [facility] Phone [phone] 03/09/2020 5:44 PM

## 2020-03-13 ENCOUNTER — Telehealth: Payer: Self-pay

## 2020-03-13 NOTE — Telephone Encounter (Signed)
Instructed patient to continue taking Plavix prior to surgery on 03/15/20. Patient verbalized understanding.

## 2020-03-13 NOTE — Anesthesia Preprocedure Evaluation (Addendum)
Anesthesia Evaluation  Patient identified by MRN, date of birth, ID band Patient awake    Reviewed: Allergy & Precautions, NPO status , Patient's Chart, lab work & pertinent test results  Airway Mallampati: II  TM Distance: >3 FB Neck ROM: Full    Dental  (+) Teeth Intact   Pulmonary neg pulmonary ROS, former smoker,    Pulmonary exam normal        Cardiovascular hypertension, Pt. on medications + Peripheral Vascular Disease   Rhythm:Regular Rate:Normal     Neuro/Psych Seizures - (most recent 01/20), Well Controlled,  Depression    GI/Hepatic negative GI ROS, (+) Hepatitis -, C  Endo/Other  negative endocrine ROS  Renal/GU negative Renal ROS  negative genitourinary   Musculoskeletal  (+) Arthritis ,   Abdominal (+)  Abdomen: soft. Bowel sounds: normal.  Peds  Hematology negative hematology ROS (+)   Anesthesia Other Findings   Reproductive/Obstetrics                           Anesthesia Physical Anesthesia Plan  ASA: III  Anesthesia Plan: General   Post-op Pain Management:    Induction: Intravenous  PONV Risk Score and Plan: 2 and Ondansetron, Dexamethasone and Treatment may vary due to age or medical condition  Airway Management Planned: Mask and Oral ETT  Additional Equipment: Arterial line  Intra-op Plan:   Post-operative Plan: Extubation in OR  Informed Consent: I have reviewed the patients History and Physical, chart, labs and discussed the procedure including the risks, benefits and alternatives for the proposed anesthesia with the patient or authorized representative who has indicated his/her understanding and acceptance.     Dental advisory given  Plan Discussed with: CRNA  Anesthesia Plan Comments: (PAT note written by Myra Gianotti, PA-C. Lab Results      Component                Value               Date                      WBC                      7.1                  03/08/2020                HGB                      14.5                03/08/2020                HCT                      45.0                03/08/2020                MCV                      94.3                03/08/2020                PLT  194                 03/08/2020           Lab Results      Component                Value               Date                      NA                       135                 03/08/2020                K                        3.8                 03/08/2020                CO2                      22                  03/08/2020                GLUCOSE                  96                  03/08/2020                BUN                      12                  03/08/2020                CREATININE               1.16                03/08/2020                CALCIUM                  9.2                 03/08/2020                GFRNONAA                 >60                 03/08/2020                GFRAA                    62                  01/07/2020          )       Anesthesia Quick Evaluation

## 2020-03-14 ENCOUNTER — Other Ambulatory Visit (HOSPITAL_COMMUNITY)
Admission: RE | Admit: 2020-03-14 | Discharge: 2020-03-14 | Disposition: A | Discharge status: Home / Self Care | Payer: PPO | Source: Ambulatory Visit | Attending: Vascular Surgery | Admitting: Vascular Surgery

## 2020-03-14 DIAGNOSIS — E860 Dehydration: Secondary | ICD-10-CM | POA: Diagnosis not present

## 2020-03-14 DIAGNOSIS — Z7982 Long term (current) use of aspirin: Secondary | ICD-10-CM | POA: Diagnosis not present

## 2020-03-14 DIAGNOSIS — Z9889 Other specified postprocedural states: Secondary | ICD-10-CM | POA: Diagnosis not present

## 2020-03-14 DIAGNOSIS — D72829 Elevated white blood cell count, unspecified: Secondary | ICD-10-CM | POA: Diagnosis not present

## 2020-03-14 DIAGNOSIS — I248 Other forms of acute ischemic heart disease: Secondary | ICD-10-CM | POA: Diagnosis not present

## 2020-03-14 DIAGNOSIS — Z20822 Contact with and (suspected) exposure to covid-19: Secondary | ICD-10-CM | POA: Insufficient documentation

## 2020-03-14 DIAGNOSIS — E7800 Pure hypercholesterolemia, unspecified: Secondary | ICD-10-CM | POA: Diagnosis present

## 2020-03-14 DIAGNOSIS — R531 Weakness: Secondary | ICD-10-CM | POA: Diagnosis not present

## 2020-03-14 DIAGNOSIS — Z452 Encounter for adjustment and management of vascular access device: Secondary | ICD-10-CM | POA: Diagnosis not present

## 2020-03-14 DIAGNOSIS — R571 Hypovolemic shock: Secondary | ICD-10-CM | POA: Diagnosis not present

## 2020-03-14 DIAGNOSIS — Z01812 Encounter for preprocedural laboratory examination: Secondary | ICD-10-CM | POA: Insufficient documentation

## 2020-03-14 DIAGNOSIS — D696 Thrombocytopenia, unspecified: Secondary | ICD-10-CM | POA: Diagnosis not present

## 2020-03-14 DIAGNOSIS — Z79899 Other long term (current) drug therapy: Secondary | ICD-10-CM | POA: Diagnosis not present

## 2020-03-14 DIAGNOSIS — J90 Pleural effusion, not elsewhere classified: Secondary | ICD-10-CM | POA: Diagnosis not present

## 2020-03-14 DIAGNOSIS — D62 Acute posthemorrhagic anemia: Secondary | ICD-10-CM | POA: Diagnosis not present

## 2020-03-14 DIAGNOSIS — S43081A Other subluxation of right shoulder joint, initial encounter: Secondary | ICD-10-CM | POA: Diagnosis not present

## 2020-03-14 DIAGNOSIS — Z87891 Personal history of nicotine dependence: Secondary | ICD-10-CM | POA: Diagnosis not present

## 2020-03-14 DIAGNOSIS — I70221 Atherosclerosis of native arteries of extremities with rest pain, right leg: Secondary | ICD-10-CM | POA: Diagnosis present

## 2020-03-14 DIAGNOSIS — I10 Essential (primary) hypertension: Secondary | ICD-10-CM | POA: Diagnosis present

## 2020-03-14 DIAGNOSIS — I82411 Acute embolism and thrombosis of right femoral vein: Secondary | ICD-10-CM | POA: Diagnosis not present

## 2020-03-14 DIAGNOSIS — I739 Peripheral vascular disease, unspecified: Secondary | ICD-10-CM | POA: Diagnosis present

## 2020-03-14 DIAGNOSIS — R3911 Hesitancy of micturition: Secondary | ICD-10-CM | POA: Diagnosis not present

## 2020-03-14 DIAGNOSIS — M1990 Unspecified osteoarthritis, unspecified site: Secondary | ICD-10-CM | POA: Diagnosis present

## 2020-03-14 DIAGNOSIS — G40909 Epilepsy, unspecified, not intractable, without status epilepticus: Secondary | ICD-10-CM | POA: Diagnosis present

## 2020-03-14 DIAGNOSIS — F32A Depression, unspecified: Secondary | ICD-10-CM | POA: Diagnosis not present

## 2020-03-14 DIAGNOSIS — B1920 Unspecified viral hepatitis C without hepatic coma: Secondary | ICD-10-CM | POA: Diagnosis not present

## 2020-03-14 DIAGNOSIS — Z95820 Peripheral vascular angioplasty status with implants and grafts: Secondary | ICD-10-CM | POA: Diagnosis not present

## 2020-03-14 DIAGNOSIS — I82441 Acute embolism and thrombosis of right tibial vein: Secondary | ICD-10-CM | POA: Diagnosis not present

## 2020-03-14 DIAGNOSIS — R109 Unspecified abdominal pain: Secondary | ICD-10-CM | POA: Diagnosis not present

## 2020-03-14 DIAGNOSIS — I9581 Postprocedural hypotension: Secondary | ICD-10-CM | POA: Diagnosis not present

## 2020-03-14 DIAGNOSIS — Z7902 Long term (current) use of antithrombotics/antiplatelets: Secondary | ICD-10-CM | POA: Diagnosis not present

## 2020-03-14 LAB — SARS CORONAVIRUS 2 (TAT 6-24 HRS): SARS Coronavirus 2: NEGATIVE

## 2020-03-15 ENCOUNTER — Inpatient Hospital Stay (HOSPITAL_COMMUNITY): Payer: PPO

## 2020-03-15 ENCOUNTER — Encounter (HOSPITAL_COMMUNITY)
Admission: RE | Disposition: A | Discharge status: Home / Self Care | Payer: Self-pay | Source: Home / Self Care | Attending: Vascular Surgery

## 2020-03-15 ENCOUNTER — Inpatient Hospital Stay (HOSPITAL_COMMUNITY): Payer: PPO | Admitting: Certified Registered"

## 2020-03-15 ENCOUNTER — Encounter (HOSPITAL_COMMUNITY): Payer: Self-pay | Admitting: Vascular Surgery

## 2020-03-15 ENCOUNTER — Other Ambulatory Visit: Payer: Self-pay

## 2020-03-15 ENCOUNTER — Other Ambulatory Visit (HOSPITAL_COMMUNITY): Payer: PPO

## 2020-03-15 ENCOUNTER — Inpatient Hospital Stay (HOSPITAL_COMMUNITY): Payer: PPO | Admitting: Vascular Surgery

## 2020-03-15 ENCOUNTER — Inpatient Hospital Stay (HOSPITAL_COMMUNITY)
Admission: RE | Admit: 2020-03-15 | Discharge: 2020-03-27 | DRG: 252 | Disposition: A | Discharge status: Home Health | Payer: PPO | Attending: Vascular Surgery | Admitting: Vascular Surgery

## 2020-03-15 DIAGNOSIS — I248 Other forms of acute ischemic heart disease: Secondary | ICD-10-CM | POA: Diagnosis not present

## 2020-03-15 DIAGNOSIS — I82411 Acute embolism and thrombosis of right femoral vein: Secondary | ICD-10-CM | POA: Diagnosis not present

## 2020-03-15 DIAGNOSIS — Z7982 Long term (current) use of aspirin: Secondary | ICD-10-CM | POA: Diagnosis not present

## 2020-03-15 DIAGNOSIS — D62 Acute posthemorrhagic anemia: Secondary | ICD-10-CM | POA: Diagnosis not present

## 2020-03-15 DIAGNOSIS — Z79899 Other long term (current) drug therapy: Secondary | ICD-10-CM

## 2020-03-15 DIAGNOSIS — Z87891 Personal history of nicotine dependence: Secondary | ICD-10-CM

## 2020-03-15 DIAGNOSIS — Z20822 Contact with and (suspected) exposure to covid-19: Secondary | ICD-10-CM | POA: Diagnosis present

## 2020-03-15 DIAGNOSIS — D696 Thrombocytopenia, unspecified: Secondary | ICD-10-CM | POA: Diagnosis not present

## 2020-03-15 DIAGNOSIS — M1990 Unspecified osteoarthritis, unspecified site: Secondary | ICD-10-CM | POA: Diagnosis present

## 2020-03-15 DIAGNOSIS — I10 Essential (primary) hypertension: Secondary | ICD-10-CM | POA: Diagnosis present

## 2020-03-15 DIAGNOSIS — R3911 Hesitancy of micturition: Secondary | ICD-10-CM | POA: Diagnosis not present

## 2020-03-15 DIAGNOSIS — Z95820 Peripheral vascular angioplasty status with implants and grafts: Secondary | ICD-10-CM

## 2020-03-15 DIAGNOSIS — R571 Hypovolemic shock: Secondary | ICD-10-CM | POA: Diagnosis not present

## 2020-03-15 DIAGNOSIS — G40909 Epilepsy, unspecified, not intractable, without status epilepticus: Secondary | ICD-10-CM | POA: Diagnosis present

## 2020-03-15 DIAGNOSIS — D72829 Elevated white blood cell count, unspecified: Secondary | ICD-10-CM

## 2020-03-15 DIAGNOSIS — I70221 Atherosclerosis of native arteries of extremities with rest pain, right leg: Secondary | ICD-10-CM | POA: Diagnosis present

## 2020-03-15 DIAGNOSIS — E7800 Pure hypercholesterolemia, unspecified: Secondary | ICD-10-CM | POA: Diagnosis present

## 2020-03-15 DIAGNOSIS — B1920 Unspecified viral hepatitis C without hepatic coma: Secondary | ICD-10-CM | POA: Diagnosis not present

## 2020-03-15 DIAGNOSIS — F32A Depression, unspecified: Secondary | ICD-10-CM | POA: Diagnosis not present

## 2020-03-15 DIAGNOSIS — Z7902 Long term (current) use of antithrombotics/antiplatelets: Secondary | ICD-10-CM | POA: Diagnosis not present

## 2020-03-15 DIAGNOSIS — M549 Dorsalgia, unspecified: Secondary | ICD-10-CM

## 2020-03-15 DIAGNOSIS — I739 Peripheral vascular disease, unspecified: Secondary | ICD-10-CM | POA: Diagnosis present

## 2020-03-15 DIAGNOSIS — Z452 Encounter for adjustment and management of vascular access device: Secondary | ICD-10-CM | POA: Diagnosis not present

## 2020-03-15 DIAGNOSIS — S43081A Other subluxation of right shoulder joint, initial encounter: Secondary | ICD-10-CM | POA: Diagnosis not present

## 2020-03-15 DIAGNOSIS — I9581 Postprocedural hypotension: Secondary | ICD-10-CM | POA: Diagnosis not present

## 2020-03-15 DIAGNOSIS — R109 Unspecified abdominal pain: Secondary | ICD-10-CM | POA: Diagnosis not present

## 2020-03-15 DIAGNOSIS — Z95828 Presence of other vascular implants and grafts: Secondary | ICD-10-CM

## 2020-03-15 DIAGNOSIS — Z789 Other specified health status: Secondary | ICD-10-CM

## 2020-03-15 DIAGNOSIS — E860 Dehydration: Secondary | ICD-10-CM | POA: Diagnosis not present

## 2020-03-15 DIAGNOSIS — K567 Ileus, unspecified: Secondary | ICD-10-CM

## 2020-03-15 DIAGNOSIS — Z9889 Other specified postprocedural states: Secondary | ICD-10-CM | POA: Diagnosis not present

## 2020-03-15 DIAGNOSIS — I82441 Acute embolism and thrombosis of right tibial vein: Secondary | ICD-10-CM | POA: Diagnosis not present

## 2020-03-15 HISTORY — PX: FEMORAL-POPLITEAL BYPASS GRAFT: SHX937

## 2020-03-15 LAB — MRSA PCR SCREENING: MRSA by PCR: NEGATIVE

## 2020-03-15 LAB — CBC
HCT: 26.3 % — ABNORMAL LOW (ref 39.0–52.0)
HCT: 24.9 % — ABNORMAL LOW (ref 39.0–52.0)
Hemoglobin: 8.5 g/dL — ABNORMAL LOW (ref 13.0–17.0)
Hemoglobin: 8.2 g/dL — ABNORMAL LOW (ref 13.0–17.0)
MCH: 31 pg (ref 26.0–34.0)
MCH: 30.9 pg (ref 26.0–34.0)
MCHC: 32.3 g/dL (ref 30.0–36.0)
MCHC: 32.9 g/dL (ref 30.0–36.0)
MCV: 96 fL (ref 80.0–100.0)
MCV: 94 fL (ref 80.0–100.0)
Platelets: 172 10*3/uL (ref 150–400)
Platelets: 179 10*3/uL (ref 150–400)
RBC: 2.74 MIL/uL — ABNORMAL LOW (ref 4.22–5.81)
RBC: 2.65 MIL/uL — ABNORMAL LOW (ref 4.22–5.81)
RDW: 13.6 % (ref 11.5–15.5)
RDW: 13.5 % (ref 11.5–15.5)
WBC: 19.7 10*3/uL — ABNORMAL HIGH (ref 4.0–10.5)
WBC: 23.7 10*3/uL — ABNORMAL HIGH (ref 4.0–10.5)
nRBC: 0 % (ref 0.0–0.2)
nRBC: 0 % (ref 0.0–0.2)

## 2020-03-15 LAB — POCT I-STAT 7, (LYTES, BLD GAS, ICA,H+H)
Acid-base deficit: 8 mmol/L — ABNORMAL HIGH (ref 0.0–2.0)
Bicarbonate: 17 mmol/L — ABNORMAL LOW (ref 20.0–28.0)
Calcium, Ion: 1.11 mmol/L — ABNORMAL LOW (ref 1.15–1.40)
HCT: 21 % — ABNORMAL LOW (ref 39.0–52.0)
Hemoglobin: 7.1 g/dL — ABNORMAL LOW (ref 13.0–17.0)
O2 Saturation: 90 %
Patient temperature: 97.6
Potassium: 3.9 mmol/L (ref 3.5–5.1)
Sodium: 137 mmol/L (ref 135–145)
TCO2: 18 mmol/L — ABNORMAL LOW (ref 22–32)
pCO2 arterial: 29.6 mmHg — ABNORMAL LOW (ref 32.0–48.0)
pH, Arterial: 7.364 (ref 7.350–7.450)
pO2, Arterial: 59 mmHg — ABNORMAL LOW (ref 83.0–108.0)

## 2020-03-15 LAB — CREATININE, SERUM
Creatinine, Ser: 1.24 mg/dL (ref 0.61–1.24)
GFR, Estimated: >60 mL/min (ref >60)

## 2020-03-15 LAB — TROPONIN I (HIGH SENSITIVITY): Troponin I (High Sensitivity): 83 ng/L — ABNORMAL HIGH (ref <18)

## 2020-03-15 LAB — PREPARE RBC (CROSSMATCH)

## 2020-03-15 LAB — LACTIC ACID, PLASMA: Lactic Acid, Venous: 5.9 mmol/L (ref 0.5–1.9)

## 2020-03-15 LAB — ABO/RH: ABO/RH(D): A POS

## 2020-03-15 LAB — GLUCOSE, CAPILLARY: Glucose-Capillary: 211 mg/dL — ABNORMAL HIGH (ref 70–99)

## 2020-03-15 LAB — POCT ACTIVATED CLOTTING TIME: Activated Clotting Time: 303 seconds

## 2020-03-15 SURGERY — BYPASS GRAFT FEMORAL-POPLITEAL ARTERY
Anesthesia: General | Site: Leg Upper | Laterality: Right

## 2020-03-15 MED ORDER — MIDAZOLAM HCL 2 MG/2ML IJ SOLN
INTRAMUSCULAR | Status: AC
  Filled 2020-03-15: qty 2

## 2020-03-15 MED ORDER — CLOPIDOGREL BISULFATE 75 MG PO TABS
75.0000 mg | ORAL_TABLET | Freq: Every day | ORAL | Status: DC
  Administered 2020-03-16 – 2020-03-27 (×12): 75 mg via ORAL
  Filled 2020-03-15 (×12): qty 1

## 2020-03-15 MED ORDER — HYDROCODONE-ACETAMINOPHEN 5-325 MG PO TABS
1.0000 | ORAL_TABLET | Freq: Four times a day (QID) | ORAL | Status: DC | PRN
  Administered 2020-03-15 – 2020-03-24 (×9): 1 via ORAL
  Filled 2020-03-15 (×9): qty 1

## 2020-03-15 MED ORDER — HEPARIN SODIUM (PORCINE) 1000 UNIT/ML IJ SOLN
INTRAMUSCULAR | Status: DC | PRN
  Administered 2020-03-15: 5000 [IU] via INTRAVENOUS
  Administered 2020-03-15: 7000 [IU] via INTRAVENOUS

## 2020-03-15 MED ORDER — PHENYLEPHRINE HCL (PRESSORS) 10 MG/ML IV SOLN
INTRAVENOUS | Status: AC
  Filled 2020-03-15: qty 1

## 2020-03-15 MED ORDER — POLYETHYLENE GLYCOL 3350 17 G PO PACK: 17.0000 g | PACK | Freq: Every day | ORAL | Status: DC | PRN

## 2020-03-15 MED ORDER — HYDROCHLOROTHIAZIDE 25 MG PO TABS
12.5000 mg | ORAL_TABLET | Freq: Every day | ORAL | Status: DC
  Administered 2020-03-16: 12.5 mg via ORAL
  Filled 2020-03-15: qty 1

## 2020-03-15 MED ORDER — BISACODYL 10 MG RE SUPP
10.0000 mg | Freq: Every day | RECTAL | Status: DC | PRN
Start: 2020-03-15 — End: 2020-03-27

## 2020-03-15 MED ORDER — HYDROMORPHONE HCL 1 MG/ML IJ SOLN: 0.2500 mg | INTRAMUSCULAR | Status: DC | PRN

## 2020-03-15 MED ORDER — CHLORHEXIDINE GLUCONATE CLOTH 2 % EX PADS
6.0000 | MEDICATED_PAD | Freq: Every day | CUTANEOUS | Status: DC
  Administered 2020-03-16 – 2020-03-20 (×4): 6 via TOPICAL

## 2020-03-15 MED ORDER — ACETAMINOPHEN 325 MG PO TABS
325.0000 mg | ORAL_TABLET | ORAL | Status: DC | PRN
  Administered 2020-03-15 – 2020-03-24 (×3): 650 mg via ORAL
  Filled 2020-03-15 (×3): qty 2

## 2020-03-15 MED ORDER — HEPARIN SODIUM (PORCINE) 1000 UNIT/ML IJ SOLN
INTRAMUSCULAR | Status: AC
  Filled 2020-03-15: qty 1

## 2020-03-15 MED ORDER — GUAIFENESIN-DM 100-10 MG/5ML PO SYRP: 15.0000 mL | ORAL_SOLUTION | ORAL | Status: DC | PRN

## 2020-03-15 MED ORDER — SODIUM CHLORIDE 0.9 % IV SOLN: INTRAVENOUS | Status: DC

## 2020-03-15 MED ORDER — PROPOFOL 10 MG/ML IV BOLUS
INTRAVENOUS | Status: DC | PRN
  Administered 2020-03-15: 20 mg via INTRAVENOUS
  Administered 2020-03-15: 150 mg via INTRAVENOUS

## 2020-03-15 MED ORDER — ACETAMINOPHEN 10 MG/ML IV SOLN: 1000.0000 mg | Freq: Once | INTRAVENOUS | Status: DC | PRN

## 2020-03-15 MED ORDER — CEFAZOLIN SODIUM-DEXTROSE 2-4 GM/100ML-% IV SOLN
2.0000 g | INTRAVENOUS | Status: AC
  Administered 2020-03-15: 2 g via INTRAVENOUS
  Filled 2020-03-15: qty 100

## 2020-03-15 MED ORDER — LIDOCAINE 2% (20 MG/ML) 5 ML SYRINGE
INTRAMUSCULAR | Status: DC | PRN
  Administered 2020-03-15: 60 mg via INTRAVENOUS

## 2020-03-15 MED ORDER — VITAMIN D 25 MCG (1000 UNIT) PO TABS
2000.0000 [IU] | ORAL_TABLET | Freq: Every day | ORAL | Status: DC
  Administered 2020-03-15 – 2020-03-27 (×13): 2000 [IU] via ORAL
  Filled 2020-03-15 (×13): qty 2

## 2020-03-15 MED ORDER — DOPAMINE-DEXTROSE 3.2-5 MG/ML-% IV SOLN
4.0000 ug/kg/min | INTRAVENOUS | Status: DC
  Administered 2020-03-15: 4 ug/kg/min via INTRAVENOUS

## 2020-03-15 MED ORDER — FENTANYL CITRATE (PF) 250 MCG/5ML IJ SOLN
INTRAMUSCULAR | Status: AC
  Filled 2020-03-15: qty 5

## 2020-03-15 MED ORDER — LIDOCAINE 2% (20 MG/ML) 5 ML SYRINGE
INTRAMUSCULAR | Status: AC
  Filled 2020-03-15: qty 5

## 2020-03-15 MED ORDER — ACETAMINOPHEN 325 MG RE SUPP
325.0000 mg | RECTAL | Status: DC | PRN
  Filled 2020-03-15: qty 2

## 2020-03-15 MED ORDER — PHENOL 1.4 % MT LIQD: 1.0000 | OROMUCOSAL | Status: DC | PRN

## 2020-03-15 MED ORDER — PROTAMINE SULFATE 10 MG/ML IV SOLN
INTRAVENOUS | Status: DC | PRN
  Administered 2020-03-15: 50 mg via INTRAVENOUS

## 2020-03-15 MED ORDER — GABAPENTIN 100 MG PO CAPS
100.0000 mg | ORAL_CAPSULE | Freq: Every day | ORAL | Status: DC
  Administered 2020-03-15 – 2020-03-27 (×13): 100 mg via ORAL
  Filled 2020-03-15 (×13): qty 1

## 2020-03-15 MED ORDER — LACTATED RINGERS IV SOLN: INTRAVENOUS | Status: DC

## 2020-03-15 MED ORDER — DOPAMINE-DEXTROSE 3.2-5 MG/ML-% IV SOLN
INTRAVENOUS | Status: AC
  Filled 2020-03-15: qty 250

## 2020-03-15 MED ORDER — SODIUM CHLORIDE 0.9% IV SOLUTION: Freq: Once | INTRAVENOUS | Status: AC

## 2020-03-15 MED ORDER — LACOSAMIDE 50 MG PO TABS
200.0000 mg | ORAL_TABLET | Freq: Every day | ORAL | Status: DC
  Administered 2020-03-15 – 2020-03-26 (×12): 200 mg via ORAL
  Filled 2020-03-15 (×13): qty 4

## 2020-03-15 MED ORDER — HEPARIN SODIUM (PORCINE) 5000 UNIT/ML IJ SOLN
5000.0000 [IU] | Freq: Three times a day (TID) | INTRAMUSCULAR | Status: DC
  Administered 2020-03-16 – 2020-03-20 (×12): 5000 [IU] via SUBCUTANEOUS
  Filled 2020-03-15 (×12): qty 1

## 2020-03-15 MED ORDER — PHENYLEPHRINE HCL-NACL 10-0.9 MG/250ML-% IV SOLN
INTRAVENOUS | Status: DC | PRN
  Administered 2020-03-15: 50 ug/min via INTRAVENOUS
  Administered 2020-03-15: 75 ug/min via INTRAVENOUS

## 2020-03-15 MED ORDER — ORAL CARE MOUTH RINSE: 15.0000 mL | Freq: Once | OROMUCOSAL | Status: AC

## 2020-03-15 MED ORDER — SODIUM CHLORIDE 0.9 % IV SOLN
INTRAVENOUS | Status: DC | PRN
  Administered 2020-03-15: 250 mL via INTRAVENOUS

## 2020-03-15 MED ORDER — CHLORHEXIDINE GLUCONATE 0.12 % MT SOLN
15.0000 mL | Freq: Once | OROMUCOSAL | Status: AC
  Administered 2020-03-15: 15 mL via OROMUCOSAL
  Filled 2020-03-15: qty 15

## 2020-03-15 MED ORDER — SODIUM CHLORIDE 0.9 % IV SOLN: 500.0000 mL | Freq: Once | INTRAVENOUS | Status: DC | PRN

## 2020-03-15 MED ORDER — ALUM & MAG HYDROXIDE-SIMETH 200-200-20 MG/5ML PO SUSP: 15.0000 mL | ORAL | Status: DC | PRN

## 2020-03-15 MED ORDER — PHENYLEPHRINE 40 MCG/ML (10ML) SYRINGE FOR IV PUSH (FOR BLOOD PRESSURE SUPPORT)
PREFILLED_SYRINGE | INTRAVENOUS | Status: AC
  Filled 2020-03-15: qty 10

## 2020-03-15 MED ORDER — MIDAZOLAM HCL 5 MG/5ML IJ SOLN
INTRAMUSCULAR | Status: DC | PRN
  Administered 2020-03-15 (×2): 1 mg via INTRAVENOUS

## 2020-03-15 MED ORDER — PANTOPRAZOLE SODIUM 40 MG PO TBEC
40.0000 mg | DELAYED_RELEASE_TABLET | Freq: Every day | ORAL | Status: DC
  Administered 2020-03-15 – 2020-03-27 (×13): 40 mg via ORAL
  Filled 2020-03-15 (×13): qty 1

## 2020-03-15 MED ORDER — MAGNESIUM SULFATE 2 GM/50ML IV SOLN: 2.0000 g | Freq: Every day | INTRAVENOUS | Status: DC | PRN

## 2020-03-15 MED ORDER — ONDANSETRON HCL 4 MG/2ML IJ SOLN
INTRAMUSCULAR | Status: AC
  Filled 2020-03-15: qty 2

## 2020-03-15 MED ORDER — ASPIRIN EC 81 MG PO TBEC
81.0000 mg | DELAYED_RELEASE_TABLET | Freq: Every day | ORAL | Status: DC
  Administered 2020-03-16 – 2020-03-27 (×12): 81 mg via ORAL
  Filled 2020-03-15 (×12): qty 1

## 2020-03-15 MED ORDER — PHENYLEPHRINE HCL-NACL 10-0.9 MG/250ML-% IV SOLN
INTRAVENOUS | Status: AC
  Administered 2020-03-15: 10 mg
  Filled 2020-03-15: qty 250

## 2020-03-15 MED ORDER — PHENYLEPHRINE 40 MCG/ML (10ML) SYRINGE FOR IV PUSH (FOR BLOOD PRESSURE SUPPORT)
PREFILLED_SYRINGE | INTRAVENOUS | Status: DC | PRN
  Administered 2020-03-15 (×2): 80 ug via INTRAVENOUS
  Administered 2020-03-15: 40 ug via INTRAVENOUS
  Administered 2020-03-15: 80 ug via INTRAVENOUS
  Administered 2020-03-15: 40 ug via INTRAVENOUS
  Administered 2020-03-15 (×2): 80 ug via INTRAVENOUS
  Administered 2020-03-15: 40 ug via INTRAVENOUS
  Administered 2020-03-15: 80 ug via INTRAVENOUS
  Administered 2020-03-15: 40 ug via INTRAVENOUS
  Administered 2020-03-15: 120 ug via INTRAVENOUS
  Administered 2020-03-15: 80 ug via INTRAVENOUS
  Administered 2020-03-15: 120 ug via INTRAVENOUS
  Administered 2020-03-15: 80 ug via INTRAVENOUS

## 2020-03-15 MED ORDER — FENTANYL CITRATE (PF) 100 MCG/2ML IJ SOLN
50.0000 ug | INTRAMUSCULAR | Status: DC | PRN
Start: 2020-03-15 — End: 2020-03-27
  Administered 2020-03-15: 50 ug via INTRAVENOUS
  Filled 2020-03-15: qty 2

## 2020-03-15 MED ORDER — PROPOFOL 10 MG/ML IV BOLUS
INTRAVENOUS | Status: AC
  Filled 2020-03-15: qty 20

## 2020-03-15 MED ORDER — SODIUM CHLORIDE 0.9 % IV SOLN
INTRAVENOUS | Status: AC
  Filled 2020-03-15: qty 1.2

## 2020-03-15 MED ORDER — PROTAMINE SULFATE 10 MG/ML IV SOLN
INTRAVENOUS | Status: AC
  Filled 2020-03-15: qty 5

## 2020-03-15 MED ORDER — LISINOPRIL 10 MG PO TABS
10.0000 mg | ORAL_TABLET | Freq: Every day | ORAL | Status: DC
  Administered 2020-03-16: 10 mg via ORAL
  Filled 2020-03-15: qty 1

## 2020-03-15 MED ORDER — CHLORHEXIDINE GLUCONATE CLOTH 2 % EX PADS: 6.0000 | MEDICATED_PAD | Freq: Once | CUTANEOUS | Status: DC

## 2020-03-15 MED ORDER — POTASSIUM CHLORIDE CRYS ER 20 MEQ PO TBCR
20.0000 meq | EXTENDED_RELEASE_TABLET | Freq: Every day | ORAL | Status: AC | PRN
  Administered 2020-03-17: 40 meq via ORAL
  Filled 2020-03-15: qty 2

## 2020-03-15 MED ORDER — CEFAZOLIN SODIUM-DEXTROSE 2-4 GM/100ML-% IV SOLN
2.0000 g | Freq: Three times a day (TID) | INTRAVENOUS | Status: AC
  Administered 2020-03-15 – 2020-03-16 (×2): 2 g via INTRAVENOUS
  Filled 2020-03-15 (×2): qty 100

## 2020-03-15 MED ORDER — METOPROLOL TARTRATE 5 MG/5ML IV SOLN: 2.0000 mg | INTRAVENOUS | Status: DC | PRN

## 2020-03-15 MED ORDER — HEMOSTATIC AGENTS (NO CHARGE) OPTIME
TOPICAL | Status: DC | PRN
  Administered 2020-03-15 (×2): 1 via TOPICAL

## 2020-03-15 MED ORDER — SODIUM CHLORIDE 0.9 % IV SOLN
INTRAVENOUS | Status: DC | PRN
  Administered 2020-03-15: 08:00:00 500 mL

## 2020-03-15 MED ORDER — LABETALOL HCL 5 MG/ML IV SOLN: 10.0000 mg | INTRAVENOUS | Status: DC | PRN

## 2020-03-15 MED ORDER — ROCURONIUM BROMIDE 10 MG/ML (PF) SYRINGE
PREFILLED_SYRINGE | INTRAVENOUS | Status: AC
  Filled 2020-03-15: qty 10

## 2020-03-15 MED ORDER — HYDRALAZINE HCL 20 MG/ML IJ SOLN: 5.0000 mg | INTRAMUSCULAR | Status: DC | PRN

## 2020-03-15 MED ORDER — FENTANYL CITRATE (PF) 100 MCG/2ML IJ SOLN
INTRAMUSCULAR | Status: DC | PRN
  Administered 2020-03-15: 100 ug via INTRAVENOUS
  Administered 2020-03-15 (×3): 50 ug via INTRAVENOUS

## 2020-03-15 MED ORDER — ROCURONIUM BROMIDE 10 MG/ML (PF) SYRINGE
PREFILLED_SYRINGE | INTRAVENOUS | Status: DC | PRN
  Administered 2020-03-15: 70 mg via INTRAVENOUS

## 2020-03-15 MED ORDER — 0.9 % SODIUM CHLORIDE (POUR BTL) OPTIME
TOPICAL | Status: DC | PRN
  Administered 2020-03-15: 1000 mL

## 2020-03-15 MED ORDER — NOREPINEPHRINE 4 MG/250ML-% IV SOLN
2.0000 ug/min | INTRAVENOUS | Status: DC
  Administered 2020-03-15: 2 ug/min via INTRAVENOUS
  Filled 2020-03-15: qty 250

## 2020-03-15 MED ORDER — INSULIN ASPART 100 UNIT/ML ~~LOC~~ SOLN
2.0000 [IU] | SUBCUTANEOUS | Status: DC
  Administered 2020-03-15: 6 [IU] via SUBCUTANEOUS
  Administered 2020-03-16 – 2020-03-17 (×3): 2 [IU] via SUBCUTANEOUS

## 2020-03-15 MED ORDER — DEXAMETHASONE SODIUM PHOSPHATE 10 MG/ML IJ SOLN
INTRAMUSCULAR | Status: AC
  Filled 2020-03-15: qty 1

## 2020-03-15 MED ORDER — ONDANSETRON HCL 4 MG/2ML IJ SOLN: 4.0000 mg | Freq: Once | INTRAMUSCULAR | Status: DC | PRN

## 2020-03-15 MED ORDER — LACOSAMIDE 50 MG PO TABS
150.0000 mg | ORAL_TABLET | Freq: Every morning | ORAL | Status: DC
  Administered 2020-03-16 – 2020-03-27 (×12): 150 mg via ORAL
  Filled 2020-03-15 (×11): qty 3

## 2020-03-15 MED ORDER — DOCUSATE SODIUM 100 MG PO CAPS
100.0000 mg | ORAL_CAPSULE | Freq: Every day | ORAL | Status: DC
  Administered 2020-03-16 – 2020-03-27 (×12): 100 mg via ORAL
  Filled 2020-03-15 (×12): qty 1

## 2020-03-15 MED ORDER — ONDANSETRON HCL 4 MG/2ML IJ SOLN
4.0000 mg | Freq: Four times a day (QID) | INTRAMUSCULAR | Status: DC | PRN
  Administered 2020-03-15: 4 mg via INTRAVENOUS
  Filled 2020-03-15: qty 2

## 2020-03-15 MED ORDER — ONDANSETRON HCL 4 MG/2ML IJ SOLN
INTRAMUSCULAR | Status: DC | PRN
  Administered 2020-03-15: 4 mg via INTRAVENOUS

## 2020-03-15 MED ORDER — DEXAMETHASONE SODIUM PHOSPHATE 10 MG/ML IJ SOLN
INTRAMUSCULAR | Status: DC | PRN
  Administered 2020-03-15: 5 mg via INTRAVENOUS

## 2020-03-15 MED ORDER — MORPHINE SULFATE (PF) 2 MG/ML IV SOLN: 2.0000 mg | INTRAVENOUS | Status: DC | PRN

## 2020-03-15 MED ORDER — ROSUVASTATIN CALCIUM 5 MG PO TABS
10.0000 mg | ORAL_TABLET | Freq: Every day | ORAL | Status: DC
Start: 2020-03-15 — End: 2020-03-16
  Administered 2020-03-15 – 2020-03-16 (×2): 10 mg via ORAL
  Filled 2020-03-15 (×2): qty 2

## 2020-03-15 MED ORDER — ALBUMIN HUMAN 5 % IV SOLN: INTRAVENOUS | Status: DC | PRN

## 2020-03-15 MED ORDER — HYDROCODONE-ACETAMINOPHEN 5-325 MG PO TABS
ORAL_TABLET | ORAL | Status: AC
  Filled 2020-03-15: qty 1

## 2020-03-15 SURGICAL SUPPLY — 64 items
BANDAGE ESMARK 6X9 LF (GAUZE/BANDAGES/DRESSINGS) IMPLANT
BNDG ESMARK 6X9 LF (GAUZE/BANDAGES/DRESSINGS)
CANISTER SUCT 3000ML PPV (MISCELLANEOUS) ×2 IMPLANT
CANNULA VESSEL 3MM 2 BLNT TIP (CANNULA) IMPLANT
CATH EMB 3FR 80CM (CATHETERS) ×2 IMPLANT
CLIP VESOCCLUDE MED 24/CT (CLIP) ×2 IMPLANT
CLIP VESOCCLUDE MED 6/CT (CLIP) ×2 IMPLANT
CLIP VESOCCLUDE SM WIDE 24/CT (CLIP) ×2 IMPLANT
CLIP VESOCCLUDE SM WIDE 6/CT (CLIP) ×6 IMPLANT
COVER SURGICAL LIGHT HANDLE (MISCELLANEOUS) ×2 IMPLANT
CUFF TOURN SGL QUICK 24 (TOURNIQUET CUFF)
CUFF TOURN SGL QUICK 34 (TOURNIQUET CUFF)
CUFF TOURN SGL QUICK 42 (TOURNIQUET CUFF) IMPLANT
CUFF TRNQT CYL 24X4X16.5-23 (TOURNIQUET CUFF) IMPLANT
CUFF TRNQT CYL 34X4.125X (TOURNIQUET CUFF) IMPLANT
DERMABOND ADVANCED (GAUZE/BANDAGES/DRESSINGS) ×1
DERMABOND ADVANCED .7 DNX12 (GAUZE/BANDAGES/DRESSINGS) ×1 IMPLANT
DRAIN CHANNEL 15F RND FF W/TCR (WOUND CARE) IMPLANT
DRAPE C-ARM 42X72 X-RAY (DRAPES) IMPLANT
DRAPE HALF SHEET 40X57 (DRAPES) IMPLANT
DRAPE X-RAY CASS 24X20 (DRAPES) IMPLANT
ELECT REM PT RETURN 9FT ADLT (ELECTROSURGICAL) ×2
ELECTRODE REM PT RTRN 9FT ADLT (ELECTROSURGICAL) ×1 IMPLANT
EVACUATOR SILICONE 100CC (DRAIN) IMPLANT
GAUZE 4X4 16PLY RFD (DISPOSABLE) ×4 IMPLANT
GLOVE BIO SURGEON STRL SZ7.5 (GLOVE) ×2 IMPLANT
GLOVE ECLIPSE 6.5 STRL STRAW (GLOVE) ×2 IMPLANT
GLOVE SURG UNDER POLY LF SZ6.5 (GLOVE) ×2 IMPLANT
GOWN STRL REUS W/ TWL LRG LVL3 (GOWN DISPOSABLE) ×2 IMPLANT
GOWN STRL REUS W/ TWL XL LVL3 (GOWN DISPOSABLE) ×1 IMPLANT
GOWN STRL REUS W/TWL LRG LVL3 (GOWN DISPOSABLE) ×4
GOWN STRL REUS W/TWL XL LVL3 (GOWN DISPOSABLE) ×2
GRAFT PROPATEN W/RING 6X80X60 (Vascular Products) ×2 IMPLANT
HEMOSTAT SNOW SURGICEL 2X4 (HEMOSTASIS) ×4 IMPLANT
INSERT FOGARTY SM (MISCELLANEOUS) ×4 IMPLANT
KIT BASIN OR (CUSTOM PROCEDURE TRAY) ×2 IMPLANT
KIT TURNOVER KIT B (KITS) ×2 IMPLANT
MARKER GRAFT CORONARY BYPASS (MISCELLANEOUS) IMPLANT
NS IRRIG 1000ML POUR BTL (IV SOLUTION) ×4 IMPLANT
PACK PERIPHERAL VASCULAR (CUSTOM PROCEDURE TRAY) ×2 IMPLANT
PAD ARMBOARD 7.5X6 YLW CONV (MISCELLANEOUS) ×4 IMPLANT
SET COLLECT BLD 21X3/4 12 (NEEDLE) IMPLANT
SPONGE LAP 18X18 RF (DISPOSABLE) ×4 IMPLANT
STOPCOCK 4 WAY LG BORE MALE ST (IV SETS) IMPLANT
SUT ETHILON 3 0 PS 1 (SUTURE) IMPLANT
SUT MNCRL AB 4-0 PS2 18 (SUTURE) ×6 IMPLANT
SUT PROLENE 5 0 C 1 24 (SUTURE) ×8 IMPLANT
SUT PROLENE 6 0 BV (SUTURE) ×20 IMPLANT
SUT PROLENE 7 0 BV 1 (SUTURE) IMPLANT
SUT SILK 2 0 SH (SUTURE) ×2 IMPLANT
SUT SILK 3 0 (SUTURE)
SUT SILK 3-0 18XBRD TIE 12 (SUTURE) IMPLANT
SUT SILK 4 0 (SUTURE) ×2
SUT SILK 4-0 18XBRD TIE 12 (SUTURE) ×1 IMPLANT
SUT VIC AB 2-0 CT1 27 (SUTURE) ×6
SUT VIC AB 2-0 CT1 TAPERPNT 27 (SUTURE) ×3 IMPLANT
SUT VIC AB 3-0 SH 27 (SUTURE) ×4
SUT VIC AB 3-0 SH 27X BRD (SUTURE) ×2 IMPLANT
SUT VIC AB 4-0 PS2 18 (SUTURE) IMPLANT
SYR 3ML LL SCALE MARK (SYRINGE) ×2 IMPLANT
TOWEL GREEN STERILE (TOWEL DISPOSABLE) ×2 IMPLANT
TRAY FOLEY MTR SLVR 16FR STAT (SET/KITS/TRAYS/PACK) ×2 IMPLANT
UNDERPAD 30X36 HEAVY ABSORB (UNDERPADS AND DIAPERS) ×2 IMPLANT
WATER STERILE IRR 1000ML POUR (IV SOLUTION) ×2 IMPLANT

## 2020-03-15 NOTE — Procedures (Signed)
Central Venous Catheter Insertion Procedure Note  DANISH RUFFINS  [id number]  10-24-1950  Date:03/15/20  Time:5:28 PM   Provider Performing:Reniah Cottingham   Procedure: Insertion of Non-tunneled Central Venous [phone]) with US guidance (52778)   Indication(s) Medication administration  Consent Risks of the procedure as well as the alternatives and risks of each were explained to the patient and/or caregiver.  Consent for the procedure was obtained and is signed in the bedside chart  Anesthesia Topical only with 1% lidocaine   Timeout Verified patient identification, verified procedure, site/side was marked, verified correct patient position, special equipment/implants available, medications/allergies/relevant history reviewed, required imaging and test results available.  Sterile Technique Maximal sterile technique including full sterile barrier drape, hand hygiene, sterile gown, sterile gloves, mask, hair covering, sterile ultrasound probe cover (if used).  Procedure Description Area of catheter insertion was cleaned with chlorhexidine and draped in sterile fashion.  With real-time ultrasound guidance a central venous catheter was placed into the left subclavian vein. Nonpulsatile blood flow and easy flushing noted in all ports.  The catheter was sutured in place and sterile dressing applied.    Single pass 95F triple lumen inserted to 20cm.       Complications/Tolerance None; patient tolerated the procedure well. Chest X-ray is ordered to verify placement for internal jugular or subclavian cannulation.   Chest x-ray is not ordered for femoral cannulation.  EBL Minimal  Lynnell Catalan, MD [facility] ICU Physician [facility]  Pager: [phone] Or Epic Secure Chat After hours: [phone].  03/15/2020, 5:30 PM

## 2020-03-15 NOTE — Progress Notes (Signed)
NAME:  Jeffrey Serrano, MRN:  [id number], DOB:  [birth year], LOS: 0 ADMISSION DATE:  03/15/2020, CONSULTATION DATE: 03/15/2020 REFERRING MD: Stanford Breed -vascular surgery, CHIEF COMPLAINT: Shock  HPI/course in hospital  69 year old man who underwent right common femoral and profunda endarterectomies with vein patch and right common femoral to peroneal bypass for critical limb ischemia.  He had presented with right lower extremity rest pain and mono phasic ABIs.  Surgery was complicated given highly calcified vessels.  In recovery patient began to complain of right flank pain.  On arrival in the intensive care unit was found to be hypotensive with systolic blood pressures in the 70s.  He complained of dizziness and right flank pain.   Past Medical History   Past Medical History:  Diagnosis Date  . Arthritis   . Hepatitis C   . High cholesterol   . Hypertension   . PVD (peripheral vascular disease) ([place])   . Seizures ([place])    most recent Jan 2020     Past Surgical History:  Procedure Laterality Date  . ABDOMINAL AORTOGRAM W/LOWER EXTREMITY N/A 01/24/2020   Procedure: ABDOMINAL AORTOGRAM W/LOWER EXTREMITY;  Surgeon: Waynetta Sandy, MD;  Location: [facility];  Service: Cardiovascular;  Laterality: N/A;  . PERIPHERAL VASCULAR INTERVENTION Right 01/24/2020   Procedure: PERIPHERAL VASCULAR INTERVENTION;  Surgeon: Waynetta Sandy, MD;  Location: [facility];  Service: Cardiovascular;  Laterality: Right;  external iliac     Review of Systems:   Review of Systems  Constitutional: Negative.   HENT: Negative.   Eyes: Negative.   Respiratory: Negative for shortness of breath.   Cardiovascular: Negative for chest pain.  Gastrointestinal: Negative.   Genitourinary: Positive for flank pain.  Musculoskeletal: Positive for back pain.  Skin: Negative.   Neurological: Positive for dizziness.  Endo/Heme/Allergies: Negative.   Psychiatric/Behavioral: Negative.      Social History   reports that he quit smoking about 31 years ago. He has never used smokeless tobacco. He reports that he does not drink alcohol and does not use drugs.   Family History   His family history includes Seizures in his father.   Allergies No Known Allergies   Home Medications  Prior to Admission medications   Medication Sig Start Date End Date Taking? Authorizing Provider  aspirin EC 81 MG EC tablet Take 1 tablet (81 mg total) by mouth daily. Swallow whole. 01/25/20  Yes Baglia, Corrina, PA-C  Cholecalciferol (VITAMIN D3) 1000 units CAPS Take 2,000 Units by mouth daily.   Yes [provider]  gabapentin (NEURONTIN) 100 MG capsule Take 100 mg by mouth daily.   Yes [provider]  hydrochlorothiazide (HYDRODIURIL) 12.5 MG tablet Take 1 tablet (12.5 mg total) by mouth daily. 01/07/20  Yes Wendie Agreste, MD  HYDROcodone-acetaminophen (NORCO/VICODIN) 5-325 MG tablet Take 1 tablet by mouth every 6 (six) hours as needed for moderate pain. 06/29/18  Yes Wendie Agreste, MD  ibuprofen (ADVIL) 200 MG tablet Take 200 mg by mouth every 6 (six) hours as needed for moderate pain.   Yes [provider]  Lacosamide (VIMPAT) 150 MG TABS Take 1 tablet (150 mg total) by mouth in the morning. 10/25/19  Yes McCue, Janett Billow, NP  lacosamide (VIMPAT) 200 MG TABS tablet Take 1 tablet (200 mg total) by mouth at bedtime. 10/25/19  Yes McCue, Janett Billow, NP  lisinopril (ZESTRIL) 10 MG tablet Take 1 tablet (10 mg total) by mouth daily. 01/07/20  Yes Wendie Agreste, MD  rosuvastatin (CRESTOR) 10 MG tablet Take 1 tablet (10 mg total) by mouth daily. 01/24/20 01/23/21 Yes Maeola Harman, MD  clopidogrel (PLAVIX) 75 MG tablet Take 1 tablet (75 mg total) by mouth daily. 01/24/20 01/23/21  Maeola Harman, MD    Interim history/subjective:  Patient feels better lying flat.  Objective   Blood pressure 104/62, pulse (!) 102, temperature (!) 97.5 F (36.4 C),  resp. rate 11, height 5\' 8"  (1.727 m), weight 71.8 kg, SpO2 99 %.        Intake/Output Summary (Last 24 hours) at 03/15/2020 1736 Last data filed at 03/15/2020 1232 Gross per 24 hour  Intake 1900 ml  Output 625 ml  Net 1275 ml   Filed Weights   03/15/20 0550  Weight: 71.8 kg    Examination: Physical Exam Constitutional:      Appearance: He is well-developed.  HENT:     Head:     Comments: Moist mucous membranes Eyes:     Comments: Pale conjunctivae  Neck:     Vascular: No JVD.     Comments: JVP is flat Pulmonary:     Effort: Pulmonary effort is normal.     Breath sounds: Normal breath sounds.  Abdominal:     General: Abdomen is protuberant. There is distension.     Tenderness: There is no abdominal tenderness.     Comments: No flank hematoma.  Groin incision clean with no ecchymoses or swelling.  No tenderness to palpation.  Genitourinary:    Comments: Foley catheter in place, marginal urine output.  Concentrated urine. Neurological:     General: No focal deficit present.     Mental Status: He is alert.  Psychiatric:        Behavior: Behavior is cooperative.     Point-of-care echocardiogram, focused exam shows normal LV and RV function.  IVC is collapsed.  Ancillary tests (personally reviewed)  CBC: Recent Labs  Lab 03/15/20 1357 03/15/20 1619  WBC 19.7* 23.7*  HGB 8.5* 8.2*  HCT 26.3* 24.9*  MCV 96.0 94.0  PLT 172 179    Basic Metabolic Panel: Recent Labs  Lab 03/15/20 1619  CREATININE 1.24   GFR: Estimated Creatinine Clearance: 53.6 mL/min (by C-G formula based on SCr of 1.24 mg/dL). Recent Labs  Lab 03/15/20 1357 03/15/20 1619  WBC 19.7* 23.7*    Liver Function Tests: No results for input(s): AST, ALT, ALKPHOS, BILITOT, PROT, ALBUMIN in the last 168 hours. No results for input(s): LIPASE, AMYLASE in the last 168 hours. No results for input(s): AMMONIA in the last 168 hours.  ABG    Component Value Date/Time   TCO2 25 01/24/2020 0806      Coagulation Profile: No results for input(s): INR, PROTIME in the last 168 hours.  Cardiac Enzymes: No results for input(s): CKTOTAL, CKMB, CKMBINDEX, TROPONINI in the last 168 hours.  HbA1C: Hgb A1c MFr Bld  Date/Time Value Ref Range Status  01/07/2020 11:42 AM 6.6 (H) 4.8 - 5.6 % Final    Comment:             Prediabetes: 5.7 - 6.4          Diabetes: >6.4          Glycemic control for adults with diabetes: <7.0   06/26/2018 11:25 AM 6.0 (H) 4.8 - 5.6 % Final    Comment:             Prediabetes: 5.7 - 6.4  Diabetes: >6.4          Glycemic control for adults with diabetes: <7.0     CBG: No results for input(s): GLUCAP in the last 168 hours.  EKG 12/29 (personal interpretation): Sinus tachycardia with lateral ST depressions which may be rate related represent demand related ischemia.  No evidence of ST elevation acute coronary syndrome  Assessment & Plan:  Critically ill due to mixed hypovolemic and distributive shock due to combination of preoperative dehydration, intraoperative insensitive losses and possible perioperative vasodilatation due to limb reperfusion. At risk for but no evidence at present of perioperative bleeding. No evidence to suggest major perioperative cardiac event. Peripheral vascular disease status post right femoral-popliteal bypass Central hypertension Well-controlled seizure disorder  Plan:  -Switch vasopressors to norepinephrine titrate to keep MAP greater than 65 -Continue fluid resuscitation with lactated Ringer's by bolus and infusion. -Transfuse 1-2 unit PRBC -CT abdomen, pelvis and right upper thigh if remains hypotensive or inadequate response to transfusion -Continue anticonvulsants.  Daily Goals Checklist  Pain/Anxiety/Delirium protocol (if indicated): Switch to fentanyl for pain control as more hemodynamically stable VAP protocol (if indicated): Not intubated Respiratory support goals: Incentive spirometry Blood pressure  target: MAP greater than 65 DVT prophylaxis: Heparin subcu Nutritional status and feeding goals: N.p.o. for now GI prophylaxis: Pantoprazole Fluid status goals: Continue fluid resuscitation, follow CVP Urinary catheter: Assessment of intravascular volume Central lines: Right radial art line, will insert subclavian triple-lumen Glucose control: Phase 1 glycemic protocol Mobility/therapy needs: Bedrest until hemodynamically stable Antibiotic de-escalation: Perioperative antibiotic Home medication reconciliation: On hold Daily labs: Serial CBC, daily CBC BMP Code Status: Full Family Communication: Per vascular surgery Disposition: ICU  Critical care time: 40 minutes    Kipp Brood, MD [facility] ICU Physician [place]  Pager: [phone] Or Epic Secure Chat After hours: [phone].  03/15/2020, 5:36 PM

## 2020-03-15 NOTE — Interval H&P Note (Signed)
History and Physical Interval Note:  03/15/2020 7:20 AM  Jeffrey Serrano  has presented today for surgery, with the diagnosis of Peripheral Artery Disease.  The various methods of treatment have been discussed with the patient and family. After consideration of risks, benefits and other options for treatment, the patient has consented to  Procedure(s): BYPASS GRAFT RIGHT FEMORAL TO BELOW KNEE POPLITEAL ARTERY (Right) as a surgical intervention.  The patient's history has been reviewed, patient examined, no change in status, stable for surgery.  I have reviewed the patient's chart and labs.  Questions were answered to the patient's satisfaction.     Lemar Livings

## 2020-03-15 NOTE — Anesthesia Procedure Notes (Signed)
Arterial Line Insertion Start/End12/29/2021 7:10 AM, 03/15/2020 7:20 AM Performed by: Shireen Quan, CRNA, CRNA  Patient location: Pre-op. Preanesthetic checklist: patient identified, IV checked, site marked, risks and benefits discussed, surgical consent, monitors and equipment checked, pre-op evaluation, timeout performed and anesthesia consent Right, radial was placed Catheter size: 20 G Hand hygiene performed , maximum sterile barriers used  and Seldinger technique used Allen's test indicative of satisfactory collateral circulation Attempts: 1 Procedure performed without using ultrasound guided technique. Following insertion, dressing applied and Biopatch. Post procedure assessment: normal  Patient tolerated the procedure well with no immediate complications.

## 2020-03-15 NOTE — Progress Notes (Signed)
Doing better on serial check. Still reporting right flank / back pain. Hemodynamics greatly improved.  Now weaning from levophed with PRBC administration. Will trend lactate and continue resuscitation overnight. If he is only a transient responder to PRBCs will consider imaging pelvis.  Rande Brunt. Lenell Antu, MD [facility] Phone Number: [phone] 03/15/2020 7:57 PM

## 2020-03-15 NOTE — Progress Notes (Signed)
Patient arrived to 2H19 at 1600. On arrival patient severely hypotensive. Patient was on of Neosynephrine and of Dopamine. The on call vascular surgeon was paged and updated on current status. MD came to the bedside. A central line was placed by CCM and 2 units of blood were ordered. Labs were collected. Critical Lactic Acid 5.9 was reported to Dr Lenell Antu; MD stated to complete blood products and repeat labs.

## 2020-03-15 NOTE — Op Note (Signed)
Patient name: Jeffrey Serrano MRN: [id number] DOB: [birth year] Sex: male  03/15/2020 Pre-operative Diagnosis: critical right lower extremity ischemia with rest pain Post-operative diagnosis:  Same Surgeon:  Luanna Salk. Randie Heinz, MD Assistant: Doreatha Massed, PA Procedure Performed: 1.  Harvest right greater saphenous vein 2.  Right common femoral and profunda femoral endartectomy with vein patch angioplasty 3.  Right common femoral to peroneal artery bypass with 6 mm ringed PTFE with adjunct of distal vein patch angioplasty.  Indications: 69 year old male with a history of right lower extremity rest pain with monophasic ABIs and toe pressure of 0.  He has peroneal artery runoff on the right heavily diseased common femoral artery by angiography.  He is indicated for common femoral endarterectomy with bypass with either vein or PTFE to his peroneal artery.  An assistant was necessary for exposure, retraction, suction, assistance with harvest of the vein, assistance with endarterectomy and all of the anastomoses.  Findings: Common femoral artery was heavily calcified.  There was no pulse except under the inguinal ligament.  There was one soft area for clamping just below the stent and above the calcification.  The greater saphenous vein was harvested from the groin all the way down to the mid calf.  It was easily dilated distally but the portion from the upper thigh unfortunately was quite sclerotic.  We attempted to perform bypass with this vein however ended up using just the proximal anastomosis as a patch angioplasty and hooked our graft to the proximal anastomosis.  Distally the profunda was actually quite large and amenable for clamping.  We placed a vein patch angioplasty and then our PTFE bypass to the vein patch.  At completion there was very strong peroneal artery signal that was graft dependent.  All wounds were primarily closed.   Procedure:  The patient was identified in the holding area and  taken to the operating room the operating room where is placed supine operative when general anesthesia was induced.  He was sterilely prepped and draped in the right lower extremity in the usual fashion antibiotics were ministered a timeout was called.  Ultrasound was used to identify what appeared to be marginal saphenous vein in the thigh and below the knee.  I also identify the common femoral artery which was heavily diseased down to the profunda.  I made a vertical incision given the amount of disease in the common femoral artery.  I dissected down to the common femoral artery this was heavily calcified.  We dissected onto the inguinal ligament to the external leg artery I could palpate the stent I was able to place a vessel loop distal to the stent and proximal to the heavy calcification where I thought I could clamped.  There were 2 large branches and temporary clips were placed on these.  We dissected out the profunda and the SFA and Vesseloops were placed around this.  We turned our attention distally where a longitudinal incision was made.  We dissected down first to the popliteal artery through the deep fascia.  I then took down the soleus bridge from the tibia.  We dissected out the posterior tibial artery this was heavily calcified and quite diminutive.  There were a few veins that needed to be repaired with 5-0 Prolene suture to control bleeding.  I dissected out the peroneal artery.  I divided one of the peroneal veins between ties for better exposure.  The artery was relatively soft in the mid calf I thought this was amenable  for clamping.  Through the same incision I then dissected out the saphenous vein.  Initially I had planned for in situ bypass.  Some branches below the knee were divided between clips and ties.  Above the knee we made 2 separate skip incisions back to our groin.  Distally we made a 2 cm incision to transect the vein and clipped it.  We dissected back to the saphenofemoral  junction we placed a Cooley clamp divided the vein.  I oversewed the saphenofemoral junction with 5-0 Prolene suture in a running mattress fashion.  I still plan for in situ bypass and so the vein was not completely harvested from the 2 thigh incisions.  The patient was fully heparinized at this time I did not tunnel.  I then clamped the profunda followed by the external leg arteries.  I opened the vessel longitudinally performed significant endarterectomy down to the profunda to establish good backbleeding this was reclamped.  I then performed endarterectomy of the entire common femoral artery there was significant calcification.  I then performed endarterectomy extending up into the external leg artery unfortunately the anterior wall of the artery was injured.  I did have to dissected higher onto the external iliac artery I was able to place a clamp just distal to the stent.  I then had to open the common femoral artery for a segment of approximately 5 cm.  Given this was not able to perform in situ bypass.  After I completed endarterectomy I turned my attention back to the vein I fully harvested the vein dividing branches between clips and ties.  This was removed.  I flushed retrograde repaired all of the branches.  We then spatulated the end of the vein remove the first valve and sewed it end-to-side to the common femoral artery.  Upon completion I then flushed through the vein graft itself.  I did have a good signal in the profunda femoris artery and the common femoral artery was very soft there is a very strong pulse there.  It up in an hour and 15 minutes into the initial heparin at this time I did tunnel from the distal incision to the groin in the subfascial plane.  I attempted to perform valvular lysis of the vein bypass I could not get valvulotome to pass.  I then placed a long 3 Fogarty I was able to get this to pass but the vein was severely diseased.  I then attempted with valvulotome again but due to  the sclerotic nature of the vein I could not pass it ultimately the vein was torn.  With this I then clamped the common femoral artery again as well as the profunda.  This time I have reheparinized the patient after the tunnel had been placed.  I brought a 6 mm ringed PTFE to the field and tunneled this maintaining orientation.  I transected the vein bypass back near the anastomosis and spatulated the vein from the anastomosis.  I then trimmed the graft to size in the groin spatulating it and sewed end to side to the previous vein patch.  At completion I had very strong pulsatile flow through our graft.  It was flushed with heparinized saline and clamped with hydrogrip clamp.  Below the knee I then evaluated the profunda femoris artery.  This was clamped distally and proximally opened longitudinally for approximately 3 cm.  I opened the piece of the saphenous vein longitudinally and sewed this is a patch in place with 6-0 Prolene suture.  Upon completion I released my clamps I had good bleeding.  I then opened the patch longitudinally.  I straighten the leg trimmed the graft to size spatulated the graft and sewed it into side to the previous vein patch.  This was done with 6-0 Prolene suture.  Prior completion of flushing all directions.  Upon completion there was a very strong pulse in the peroneal artery distally confirmed with Doppler.  There is a peroneal artery signal at the ankle.  Satisfied with this we administered 50 mg of protamine.  We obtain hemostasis irrigated all her wounds and closed in layers of Vicryl and Monocryl.  He was then awakened from anesthesia having tolerated procedure well any complication all counts were correct at completion.  EBL: 250 cc     Cristopher Ciccarelli C. Donzetta Matters, MD [facility] [place] Office: [phone] Pager: [phone]

## 2020-03-15 NOTE — Anesthesia Procedure Notes (Signed)
Procedure Name: Intubation Date/Time: 03/15/2020 7:44 AM Performed by: Moshe Salisbury, CRNA Pre-anesthesia Checklist: Patient identified, Emergency Drugs available, Suction available and Patient being monitored Patient Re-evaluated:Patient Re-evaluated prior to induction Oxygen Delivery Method: Circle System Utilized Preoxygenation: Pre-oxygenation with 100% oxygen Induction Type: IV induction Ventilation: Mask ventilation without difficulty Laryngoscope Size: Mac and 4 Grade View: Grade II Tube type: Oral Tube size: 8.0 mm Number of attempts: 1 Airway Equipment and Method: Stylet Placement Confirmation: ETT inserted through vocal cords under direct vision,  positive ETCO2 and breath sounds checked- equal and bilateral Secured at: 22 cm Tube secured with: Tape Dental Injury: Teeth and Oropharynx as per pre-operative assessment

## 2020-03-15 NOTE — Anesthesia Postprocedure Evaluation (Signed)
Anesthesia Post Note  Patient: OLAN KUREK  Procedure(s) Performed: BYPASS GRAFT RIGHT FEMORAL TO BELOW KNEE POPLITEAL ARTERY USING 70mm REMOVABLE RING PROPATEN GORE GRAFT (Right Leg Upper)     Patient location during evaluation: PACU Anesthesia Type: General Level of consciousness: awake and alert Pain management: pain level controlled Vital Signs Assessment: post-procedure vital signs reviewed and stable Respiratory status: spontaneous breathing, nonlabored ventilation, respiratory function stable and patient connected to nasal cannula oxygen Cardiovascular status: stable Postop Assessment: no apparent nausea or vomiting Anesthetic complications: yes Comments: Patient with persistent hypotension despite fluid boluses and phenylephrine in addition to dopamine vasopressor titration. Mild ST depression noted on monitor, 12 lead EKG confirmed inferiolateral ischemia likely demand in nature. Patient's admission upgraded to ICU care.    No complications documented.  Last Vitals:  Vitals:   03/15/20 1525 03/15/20 1530  BP: (!) 79/64 104/62  Pulse: (!) 107 (!) 102  Resp: 10 11  Temp:  (!) 36.4 C  SpO2: 97% 99%    Last Pain:  Vitals:   03/15/20 1525  TempSrc:   PainSc: 3                  Addisen Chappelle P Finlee Milo

## 2020-03-15 NOTE — Progress Notes (Signed)
Seen in ICU postoperatively. Hypotension throughout perioperative and immediate postoperative course. Several liters of fluid administered.  Patient now tachycardic to 120s. Hypotension continues requiring two vasopressors. Dr. Drucie Ip and I evaluated the patient at the bedside. No evidence of hematoma on exam. Bedside transthoracic echocardiogram by Dr. Drucie Ip shows normal squeeze of LV. ? Apical hypokinesis, but this is subtle. No evidence of wall motion abnormality to suggest cardiac event. IVC visualized with ultrasound and found to be underfilled. Jugular veins flat. Hgb 8.5 from 14.5 Suspect he will continue to drift down and needs blood / volume resuscitation. Will transfuse 2u PRBC Dr. Drucie Ip placing central venous catheter to administer vasopressors. Will trend labs and watch carefully.  Jeffrey Serrano. Jeffrey Antu, MD [facility] Phone Number: [phone] 03/15/2020 5:09 PM

## 2020-03-15 NOTE — Progress Notes (Signed)
  Day of Surgery Note    Subjective:  Resting comfortably   Vitals:   03/15/20 1321 03/15/20 1336  BP: 117/67 (!) 77/59  Pulse: 76 83  Resp: 10 17  Temp:    SpO2: 100% 100%    Incisions:   All incisions look fine Extremities:  right peroneal doppler signal.  DP doppler signal also present.  Very faint right PT doppler signal Cardiac:  regular Lungs:  Non labored   Assessment/Plan:  This is a 69 y.o. male who is s/p  1.  Harvest right greater saphenous vein 2.  Right common femoral and profunda femoral endartectomy with vein patch angioplasty 3.  Right common femoral to peroneal artery bypass with 6 mm ringed PTFE with adjunct of distal vein patch angioplasty.   -pt requiring Neo for blood pressure support.  Check cbc  -bypass is patent with good right peroneal doppler signal.  DP doppler signal also present.  Very faint right PT doppler signal -continue plavix/asa -to 4 east later this afternoon   Doreatha Massed, PA-C 03/15/2020 1:52 PM [phone]

## 2020-03-15 NOTE — Transfer of Care (Signed)
Immediate Anesthesia Transfer of Care Note  Patient: Jeffrey Serrano  Procedure(s) Performed: BYPASS GRAFT RIGHT FEMORAL TO BELOW KNEE POPLITEAL ARTERY USING 63mm REMOVABLE RING PROPATEN GORE GRAFT (Right Leg Upper)  Patient Location: PACU  Anesthesia Type:General  Level of Consciousness: drowsy and patient cooperative  Airway & Oxygen Therapy: Patient Spontanous Breathing and Patient connected to nasal cannula oxygen  Post-op Assessment: Report given to RN, Post -op Vital signs reviewed and stable and Patient moving all extremities  Post vital signs: Reviewed and stable  Last Vitals:  Vitals Value Taken Time  BP 101/66 03/15/20 1254  Temp    Pulse 78 03/15/20 1254  Resp 16 03/15/20 1254  SpO2 100 % 03/15/20 1254  Vitals shown include unvalidated device data.  Last Pain:  Vitals:   03/15/20 0552  TempSrc:   PainSc: 3       Patients Stated Pain Goal: 3 (03/15/20 0552)  Complications: No complications documented.

## 2020-03-16 ENCOUNTER — Inpatient Hospital Stay (HOSPITAL_COMMUNITY): Payer: PPO

## 2020-03-16 ENCOUNTER — Other Ambulatory Visit (HOSPITAL_COMMUNITY): Payer: PPO

## 2020-03-16 ENCOUNTER — Encounter (HOSPITAL_COMMUNITY): Payer: Self-pay | Admitting: Vascular Surgery

## 2020-03-16 DIAGNOSIS — R571 Hypovolemic shock: Secondary | ICD-10-CM | POA: Diagnosis not present

## 2020-03-16 DIAGNOSIS — I248 Other forms of acute ischemic heart disease: Secondary | ICD-10-CM | POA: Diagnosis not present

## 2020-03-16 LAB — HEPATIC FUNCTION PANEL
ALT: 17 U/L (ref 0–44)
AST: 80 U/L — ABNORMAL HIGH (ref 15–41)
Albumin: 2.9 g/dL — ABNORMAL LOW (ref 3.5–5.0)
Alkaline Phosphatase: 32 U/L — ABNORMAL LOW (ref 38–126)
Bilirubin, Direct: 0.3 mg/dL — ABNORMAL HIGH (ref 0.0–0.2)
Indirect Bilirubin: 0.7 mg/dL (ref 0.3–0.9)
Total Bilirubin: 1 mg/dL (ref 0.3–1.2)
Total Protein: 4.9 g/dL — ABNORMAL LOW (ref 6.5–8.1)

## 2020-03-16 LAB — CBC
HCT: 31.4 % — ABNORMAL LOW (ref 39.0–52.0)
HCT: 31.5 % — ABNORMAL LOW (ref 39.0–52.0)
HCT: 32.7 % — ABNORMAL LOW (ref 39.0–52.0)
Hemoglobin: 10.7 g/dL — ABNORMAL LOW (ref 13.0–17.0)
Hemoglobin: 11.3 g/dL — ABNORMAL LOW (ref 13.0–17.0)
Hemoglobin: 11.3 g/dL — ABNORMAL LOW (ref 13.0–17.0)
MCH: 30.1 pg (ref 26.0–34.0)
MCH: 30.5 pg (ref 26.0–34.0)
MCH: 31.3 pg (ref 26.0–34.0)
MCHC: 34.1 g/dL (ref 30.0–36.0)
MCHC: 34.6 g/dL (ref 30.0–36.0)
MCHC: 35.9 g/dL (ref 30.0–36.0)
MCV: 87.3 fL (ref 80.0–100.0)
MCV: 88.4 fL (ref 80.0–100.0)
MCV: 88.5 fL (ref 80.0–100.0)
Platelets: 111 10*3/uL — ABNORMAL LOW (ref 150–400)
Platelets: 127 10*3/uL — ABNORMAL LOW (ref 150–400)
Platelets: 130 10*3/uL — ABNORMAL LOW (ref 150–400)
RBC: 3.55 MIL/uL — ABNORMAL LOW (ref 4.22–5.81)
RBC: 3.61 MIL/uL — ABNORMAL LOW (ref 4.22–5.81)
RBC: 3.7 MIL/uL — ABNORMAL LOW (ref 4.22–5.81)
RDW: 14.2 % (ref 11.5–15.5)
RDW: 14.3 % (ref 11.5–15.5)
RDW: 14.3 % (ref 11.5–15.5)
WBC: 11.8 10*3/uL — ABNORMAL HIGH (ref 4.0–10.5)
WBC: 11.9 10*3/uL — ABNORMAL HIGH (ref 4.0–10.5)
WBC: 15.4 10*3/uL — ABNORMAL HIGH (ref 4.0–10.5)
nRBC: 0 % (ref 0.0–0.2)
nRBC: 0 % (ref 0.0–0.2)
nRBC: 0 % (ref 0.0–0.2)

## 2020-03-16 LAB — LIPID PANEL
Cholesterol: 60 mg/dL (ref 0–200)
HDL: 27 mg/dL — ABNORMAL LOW (ref >40)
LDL Cholesterol: 24 mg/dL (ref 0–99)
Total CHOL/HDL Ratio: 2.2 RATIO
Triglycerides: 44 mg/dL (ref <150)
VLDL: 9 mg/dL (ref 0–40)

## 2020-03-16 LAB — TYPE AND SCREEN
ABO/RH(D): A POS
Antibody Screen: NEGATIVE
Unit division: 0
Unit division: 0

## 2020-03-16 LAB — BASIC METABOLIC PANEL
Anion gap: 7 (ref 5–15)
Anion gap: 8 (ref 5–15)
BUN: 11 mg/dL (ref 8–23)
BUN: 11 mg/dL (ref 8–23)
CO2: 20 mmol/L — ABNORMAL LOW (ref 22–32)
CO2: 21 mmol/L — ABNORMAL LOW (ref 22–32)
Calcium: 8 mg/dL — ABNORMAL LOW (ref 8.9–10.3)
Calcium: 8 mg/dL — ABNORMAL LOW (ref 8.9–10.3)
Chloride: 106 mmol/L (ref 98–111)
Chloride: 106 mmol/L (ref 98–111)
Creatinine, Ser: 1.24 mg/dL (ref 0.61–1.24)
Creatinine, Ser: 1.1 mg/dL (ref 0.61–1.24)
GFR, Estimated: >60 mL/min (ref >60)
GFR, Estimated: >60 mL/min (ref >60)
Glucose, Bld: 128 mg/dL — ABNORMAL HIGH (ref 70–99)
Glucose, Bld: 100 mg/dL — ABNORMAL HIGH (ref 70–99)
Potassium: 4 mmol/L (ref 3.5–5.1)
Potassium: 3.8 mmol/L (ref 3.5–5.1)
Sodium: 133 mmol/L — ABNORMAL LOW (ref 135–145)
Sodium: 135 mmol/L (ref 135–145)

## 2020-03-16 LAB — GLUCOSE, CAPILLARY
Glucose-Capillary: 105 mg/dL — ABNORMAL HIGH (ref 70–99)
Glucose-Capillary: 110 mg/dL — ABNORMAL HIGH (ref 70–99)
Glucose-Capillary: 114 mg/dL — ABNORMAL HIGH (ref 70–99)
Glucose-Capillary: 122 mg/dL — ABNORMAL HIGH (ref 70–99)
Glucose-Capillary: 131 mg/dL — ABNORMAL HIGH (ref 70–99)
Glucose-Capillary: 93 mg/dL (ref 70–99)
Glucose-Capillary: 96 mg/dL (ref 70–99)

## 2020-03-16 LAB — ECHOCARDIOGRAM COMPLETE
Area-P 1/2: 2.99 cm2
Height: 68 in
S' Lateral: 2.55 cm
Weight: 2532.64 oz

## 2020-03-16 LAB — BPAM RBC
Blood Product Expiration Date: 202201242359
Blood Product Expiration Date: 202201242359
ISSUE DATE / TIME: 202112291711
ISSUE DATE / TIME: 202112291836
Unit Type and Rh: 6200
Unit Type and Rh: 6200

## 2020-03-16 LAB — LACTIC ACID, PLASMA: Lactic Acid, Venous: 2.1 mmol/L (ref 0.5–1.9)

## 2020-03-16 LAB — TROPONIN I (HIGH SENSITIVITY)
Troponin I (High Sensitivity): 17646 ng/L (ref <18)
Troponin I (High Sensitivity): 18341 ng/L (ref <18)
Troponin I (High Sensitivity): 17726 ng/L (ref <18)

## 2020-03-16 MED ORDER — SODIUM CHLORIDE 0.9 % IV SOLN: INTRAVENOUS | Status: DC | PRN

## 2020-03-16 MED ORDER — ROSUVASTATIN CALCIUM 20 MG PO TABS
20.0000 mg | ORAL_TABLET | Freq: Every day | ORAL | Status: DC
  Administered 2020-03-17 – 2020-03-27 (×11): 20 mg via ORAL
  Filled 2020-03-16 (×11): qty 1

## 2020-03-16 NOTE — Progress Notes (Signed)
NAME:  Jeffrey Serrano, MRN:  [id number], DOB:  1951-01-30, LOS: 1 ADMISSION DATE:  03/15/2020, CONSULTATION DATE: 03/15/2020 REFERRING MD: Stanford Breed -vascular surgery, CHIEF COMPLAINT: Shock  HPI/course in hospital  69 year old man who underwent right common femoral and profunda endarterectomies with vein patch and right common femoral to peroneal bypass for critical limb ischemia.  He had presented with right lower extremity rest pain and mono phasic ABIs.  Surgery was complicated given highly calcified vessels.  In recovery patient began to complain of right flank pain.  On arrival in the intensive care unit was found to be hypotensive with systolic blood pressures in the 70s.  He complained of dizziness and right flank pain.   Past Medical History   Past Medical History:  Diagnosis Date  . Arthritis   . Hepatitis C   . High cholesterol   . Hypertension   . PVD (peripheral vascular disease) ([place])   . Seizures ([place])    most recent Jan 2020     Past Surgical History:  Procedure Laterality Date  . ABDOMINAL AORTOGRAM W/LOWER EXTREMITY N/A 01/24/2020   Procedure: ABDOMINAL AORTOGRAM W/LOWER EXTREMITY;  Surgeon: Waynetta Sandy, MD;  Location: [facility];  Service: Cardiovascular;  Laterality: N/A;  . PERIPHERAL VASCULAR INTERVENTION Right 01/24/2020   Procedure: PERIPHERAL VASCULAR INTERVENTION;  Surgeon: Waynetta Sandy, MD;  Location: [facility];  Service: Cardiovascular;  Laterality: Right;  external iliac   Interim history/subjective:  Feels better today. Flank pain has resolved and pressors weaned off.   Objective   Blood pressure 119/73, pulse 83, temperature 98.3 F (36.8 C), temperature source Oral, resp. rate 19, height 5\' 8"  (1.727 m), weight 71.8 kg, SpO2 96 %. CVP:  [1 mmHg-8 mmHg] 6 mmHg      Intake/Output Summary (Last 24 hours) at 03/16/2020 0852 Last data filed at 03/16/2020 0800 Gross per 24 hour  Intake 4492.02 ml   Output 1195 ml  Net 3297.02 ml   Filed Weights   03/15/20 0550  Weight: 71.8 kg   Examination:  Physical Exam Constitutional:      Appearance: He is well-developed.  HENT:     Head:     Comments: Moist mucous membranes Eyes:     Comments: Pale conjunctivae  Neck:     Vascular: No JVD.     Comments: JVP is flat Cardiac:      HS normal, tachycardia has resolved.      Generalized edema Pulmonary:     Effort: Pulmonary effort is normal.     Breath sounds: Normal breath sounds.  Abdominal:     General: Abdomen is protuberant. There is distension.     Tenderness: There is no abdominal tenderness.     Comments: No flank hematoma.  Groin incision clean with no ecchymoses or swelling.  No tenderness to palpation.  Genitourinary:    Comments: Foley catheter in place, improved urine output Neurological:     General: No focal deficit present.     Mental Status: He is alert.  Psychiatric:        Behavior: Behavior is cooperative.   Ancillary tests (personally reviewed)  CBC: Recent Labs  Lab 03/15/20 1357 03/15/20 1619 03/15/20 1740 03/16/20 0013 03/16/20 0403  WBC 19.7* 23.7*  --  15.4* 11.8*  HGB 8.5* 8.2* 7.1* 11.3* 10.7*  HCT 26.3* 24.9* 21.0* 31.5* 31.4*  MCV 96.0 94.0  --  87.3 88.5  PLT 172 179  --  127* 111*  Basic Metabolic Panel: Recent Labs  Lab 03/15/20 1619 03/15/20 1740 03/16/20 0013 03/16/20 0403  NA  --  137 133* 135  K  --  3.9 4.0 3.8  CL  --   --  106 106  CO2  --   --  20* 21*  GLUCOSE  --   --  128* 100*  BUN  --   --  11 11  CREATININE 1.24  --  1.24 1.10  CALCIUM  --   --  8.0* 8.0*   GFR: Estimated Creatinine Clearance: 60.5 mL/min (by C-G formula based on SCr of 1.1 mg/dL). Recent Labs  Lab 03/15/20 1357 03/15/20 1619 03/15/20 1712 03/16/20 0013 03/16/20 0358 03/16/20 0403  WBC 19.7* 23.7*  --  15.4*  --  11.8*  LATICACIDVEN  --   --  5.9*  --  2.1*  --     Liver Function Tests: Recent Labs  Lab 03/16/20 0013  AST  80*  ALT 17  ALKPHOS 32*  BILITOT 1.0  PROT 4.9*  ALBUMIN 2.9*   No results for input(s): LIPASE, AMYLASE in the last 168 hours. No results for input(s): AMMONIA in the last 168 hours.  ABG    Component Value Date/Time   PHART 7.364 03/15/2020 1740   PCO2ART 29.6 (L) 03/15/2020 1740   PO2ART 59 (L) 03/15/2020 1740   HCO3 17.0 (L) 03/15/2020 1740   TCO2 18 (L) 03/15/2020 1740   ACIDBASEDEF 8.0 (H) 03/15/2020 1740   O2SAT 90.0 03/15/2020 1740    Coagulation Profile: No results for input(s): INR, PROTIME in the last 168 hours.  Cardiac Enzymes: No results for input(s): CKTOTAL, CKMB, CKMBINDEX, TROPONINI in the last 168 hours.  HbA1C: Hgb A1c MFr Bld  Date/Time Value Ref Range Status  01/07/2020 11:42 AM 6.6 (H) 4.8 - 5.6 % Final    Comment:             Prediabetes: 5.7 - 6.4          Diabetes: >6.4          Glycemic control for adults with diabetes: <7.0   06/26/2018 11:25 AM 6.0 (H) 4.8 - 5.6 % Final    Comment:             Prediabetes: 5.7 - 6.4          Diabetes: >6.4          Glycemic control for adults with diabetes: <7.0    CBG: Recent Labs  Lab 03/15/20 2024 03/16/20 0011 03/16/20 0337 03/16/20 0651  GLUCAP 211* 131* 105* 93   EKG 12/29 (personal interpretation): Sinus tachycardia with lateral ST depressions which may be rate related represent demand related ischemia.  No evidence of ST elevation acute coronary syndrome  Assessment & Plan:   Was critically ill due to mixed hypovolemic and distributive shock due to combination of preoperative dehydration, intraoperative insensitive losses and possible perioperative vasodilatation due to limb reperfusion. At risk for but no evidence at present of perioperative bleeding. Demand related troponin rise, but no evidence to suggest major perioperative cardiac event. Peripheral vascular disease status post right femoral-popliteal bypass Central hypertension Well-controlled seizure disorder  Plan:  -Remove  arterial line -Stop IV fluids as increasing edema.  - Repeat EKG - Repeat echocardiogram  Daily Goals Checklist  Pain/Anxiety/Delirium protocol (if indicated): Switch to fentanyl for pain control as more hemodynamically stable VAP protocol (if indicated): Not intubated Respiratory support goals: Incentive spirometry Blood pressure target: MAP greater than 65 DVT  prophylaxis: Heparin subcu Nutritional status and feeding goals: N.p.o. for now GI prophylaxis: Pantoprazole Fluid status goals: allow autoregulation. Consider diuresis tomorrow.  Urinary catheter: Assessment of intravascular volume Central lines: Right radial art line, will insert subclavian triple-lumen Glucose control: Phase 1 glycemic protocol Mobility/therapy needs: progressive ambulation.  Antibiotic de-escalation: Perioperative antibiotic Home medication reconciliation: On hold Daily labs: Serial CBC, daily CBC BMP Code Status: Full Family Communication: Per vascular surgery Disposition: ICU  Kipp Brood, MD [facility] ICU Physician [place]  Pager: [phone] Or Epic Secure Chat After hours: [phone].  03/16/2020, 8:52 AM

## 2020-03-16 NOTE — Progress Notes (Signed)
  Echocardiogram 2D Echocardiogram with 3D has been performed.  Leta Jungling M 03/16/2020, 10:34 AM

## 2020-03-16 NOTE — Progress Notes (Signed)
PHARMACIST LIPID MONITORING   Jeffrey Serrano is a 69 y.o. male admitted on 03/15/2020 with PVD.  Pharmacy has been consulted to optimize lipid-lowering therapy with the indication of secondary prevention for clinical ASCVD.  Recent Labs:  Lipid Panel (last 6 months):   Lab Results  Component Value Date   CHOL 60 03/16/2020   TRIG 44 03/16/2020   HDL 27 (L) 03/16/2020   CHOLHDL 2.2 03/16/2020   VLDL 9 03/16/2020   LDLCALC 24 03/16/2020    Hepatic function panel (last 6 months):   Lab Results  Component Value Date   AST 80 (H) 03/16/2020   ALT 17 03/16/2020   ALKPHOS 32 (L) 03/16/2020   BILITOT 1.0 03/16/2020   BILIDIR 0.3 (H) 03/16/2020   IBILI 0.7 03/16/2020    SCr (since admission):   Serum creatinine: 1.1 mg/dL 01/77/93 9030 Estimated creatinine clearance: 60.5 mL/min  Current lipid-lowering therapy: crestor 10mg /d Previous lipid-lowering therapies (if applicable): none  Documented or reported allergies or intolerances to lipid-lowering therapies (if applicable): none  Assessment:  Patient agrees with changes to lipid-lowering therapy  Recommendation per protocol:  Increase intensity or dose of current statin.  Follow-up with:  Primary care provider - , MD  Follow-up labs after discharge:    Liver function panel and lipid panel in 8-12 weeks then annually  Plan: -Increase crestor to 20mg  po daily  10-12, PharmD Clinical Pharmacist **Pharmacist phone directory can now be found on [email or web address] ([email or web address]).  Listed under [facility].

## 2020-03-16 NOTE — Progress Notes (Signed)
CRITICAL VALUE ALERT  Critical Value:  Trop [id number]  Date & Time Notied:  03/16/2020 0500  Provider Notified: Lenell Antu. MD  Orders Received/Actions taken: EKG and continue to trend [place]

## 2020-03-16 NOTE — Progress Notes (Addendum)
[facility]  Subjective  - No complaints of CP, right flank/abdominal pain, or SOB.  A & O    Objective 100/72 85 98.5 F (36.9 C) (Oral) (!) 25 95%  Intake/Output Summary (Last 24 hours) at 03/16/2020 0718 Last data filed at 03/16/2020 0630 Gross per 24 hour  Intake 4406.22 ml  Output 1195 ml  Net 3211.22 ml    Doppler signals brisk Peroneal/PT/DP right LE Motor intact B LE Incisions healing well, groin soft and right LQ NTTP Heart RRR Lungs non labored   Assessment/Planning: POD # 1  Procedure Performed: 1.  Harvest right greater saphenous vein 2.  Right common femoral and profunda femoral endartectomy with vein patch angioplasty 3.  Right common femoral to peroneal artery bypass with 6 mm ringed PTFE with adjunct of distal vein patch angioplasty.  EKG  Normal sinus rhythm Left axis deviation Minimal voltage criteria for LVH, may be normal variant ( R in aVL ) Inferior-posterior infarct , age undetermined ST & T wave abnormality, consider lateral ischemia Abnormal ECG  Trop 1619 03/15/20 83 ng/L, second reading 17,646 without CP CVP 6 will continue to hydrate  NS @ 100, repeat EKG at 12 noon  Post op anemia with EBL 250 ml lost was  7.1 transfused 2 units PRBC stable 11.3 0013 and 10.7 0403 03/16/20  Off pressors A & O x 3  CBC, BMET this afternoon and in the am     Mosetta Pigeon 03/16/2020 7:18 AM --  Laboratory Lab Results: Recent Labs    03/16/20 0013 03/16/20 0403  WBC 15.4* 11.8*  HGB 11.3* 10.7*  HCT 31.5* 31.4*  PLT 127* 111*   BMET Recent Labs    03/16/20 0013 03/16/20 0403  NA 133* 135  K 4.0 3.8  CL 106 106  CO2 20* 21*  GLUCOSE 128* 100*  BUN 11 11  CREATININE 1.24 1.10  CALCIUM 8.0* 8.0*    COAG Lab Results  Component Value Date   INR 1.1 03/08/2020   INR 1.1 06/11/2019   INR 1.0 08/07/2016   No results found for: PTT  I have independently interviewed and exaqmined  patient and agree with PA assessment and plan above.  Right foot is well-perfused.  Troponins have peaked does not have any chest pain today.  He did have some dizziness with standing today.  H&H is stable.  We will continue ICU care this evening consider transfer to the floor tomorrow if he remains stable.  Jeffrey Rohr C. Randie Heinz, MD [facility] Office: [phone] Pager: [phone]

## 2020-03-16 NOTE — Plan of Care (Signed)

## 2020-03-16 NOTE — Evaluation (Signed)
Occupational Therapy Evaluation Patient Details Name: Jeffrey Serrano MRN: [id number] DOB: [birth year] Today's Date: 03/16/2020    History of Present Illness 69 y.o. male with lower extremity claudication.  He has hypertension as a risk factor.  Patient takes Plavix now status post stent of right external iliac artery.  Underwent a BYPASS GRAFT RIGHT FEMORAL TO BELOW KNEE POPLITEAL ARTERY (Right) 12/29.  Was transferred to the ICU for R flank pain and hypotension after surgery.   Clinical Impression   Patient admitted for the above diagnosis and procedure.  Overall evaluation limited by dizziness with movement this date.  He was able to stand pivot to the recliner.  Deficits are listed below which are impacting independence.  PTA he lives with his brother, please see prior level for details.  He is also limited by ICU lines and leads.  OT will continue to follow in the acute setting.  [facility] OT is recommended for now, but may be changed depending on progress and patient preferences.      Follow Up Recommendations  Home health OT    Equipment Recommendations  Tub/shower seat    Recommendations for Other Services       Precautions / Restrictions Precautions Precautions: Fall Restrictions Weight Bearing Restrictions: No      Mobility Bed Mobility Overal bed mobility: Needs Assistance Bed Mobility: Rolling;Supine to Sit Rolling: Supervision   Supine to sit: Min assist          Transfers Overall transfer level: Needs assistance Equipment used: Rolling walker (2 wheeled) Transfers: Sit to/from [facility] Sit to Stand: Min assist Stand pivot transfers: Min assist            Balance Overall balance assessment: Needs assistance Sitting-balance support: No upper extremity supported Sitting balance-Leahy Scale: Fair     Standing balance support: Bilateral upper extremity supported Standing balance-Leahy Scale: Poor Standing balance comment: relies on RW for  external support                           ADL either performed or assessed with clinical judgement   ADL Overall ADL's : Needs assistance/impaired     Grooming: Wash/dry hands;Wash/dry face;Set up;Sitting   Upper Body Bathing: Moderate assistance;Sitting   Lower Body Bathing: Maximal assistance;Sit to/from stand   Upper Body Dressing : Moderate assistance;Sitting   Lower Body Dressing: Maximal assistance;Sit to/from stand               Functional mobility during ADLs: Minimal assistance;Rolling walker General ADL Comments: transfer only due to dizziness:  BP recorded supine, sit and attempted stand.     Vision Baseline Vision/History: Wears glasses Wears Glasses: Reading only Patient Visual Report: No change from baseline       Perception     Praxis      Pertinent Vitals/Pain Pain Assessment: Faces Faces Pain Scale: Hurts a little bit Pain Location: R leg.  Patient is very stoic, and denies pain as any thing that will bother him. Pain Descriptors / Indicators: Tightness Pain Intervention(s): Monitored during session     Hand Dominance Right   Extremity/Trunk Assessment Upper Extremity Assessment Upper Extremity Assessment: Overall WFL for tasks assessed   Lower Extremity Assessment Lower Extremity Assessment: Defer to PT evaluation   Cervical / Trunk Assessment Cervical / Trunk Assessment: Normal   Communication Communication Communication: Other (comment);No difficulties (patient gives short answers and defers to his brother for certain answers.)   Cognition Arousal/Alertness:  Awake/alert Behavior During Therapy: Flat affect Overall Cognitive Status: Difficult to assess                                 General Comments: Patient followed commands, and appears appropriate, but deferred to his brother for certain questions.   General Comments   PT eval details BP's taken.  His SBP did rise slightly with movement from supine to  sit to stand, but dizziness was persistent.      Exercises     Shoulder Instructions      Home Living Family/patient expects to be discharged to:: Private residence Living Arrangements: Other relatives;Other (Comment) (Brother) Available Help at Discharge: Family;Available 24 hours/day Type of Home: House Home Access: Stairs to enter [employer] of Steps: 4   Home Layout: One level     Bathroom Shower/Tub: Chief Strategy Officer: Standard     Home Equipment: None          Prior Functioning/Environment Level of Independence: Independent        Comments: Patient stating he is retired, but works part time at nights for a [employer].  Patient states he had difficulty walking long distances, but did not use any DME.  He was able to care for himself.  His brother assists with community mobility.        OT Problem List: Decreased strength;Decreased activity tolerance;Impaired balance (sitting and/or standing);Decreased safety awareness;Increased edema;Pain      OT Treatment/Interventions: Self-care/ADL training;Therapeutic exercise;Energy conservation;DME and/or AE instruction;Balance training;Therapeutic activities    OT Goals(Current goals can be found in the care plan section) Acute Rehab OT Goals OT Goal Formulation: With patient Time For Goal Achievement: 03/30/20 Potential to Achieve Goals: Good ADL Goals Pt Will Perform Grooming: with set-up;sitting;standing Pt Will Perform Lower Body Bathing: with set-up;sit to/from stand Pt Will Perform Lower Body Dressing: with set-up;sit to/from stand Pt Will Transfer to Toilet: with modified independence;regular height toilet;ambulating  OT Frequency: Min 2X/week   Barriers to D/C:    none noted       Co-evaluation PT/OT/SLP Co-Evaluation/Treatment: Yes Reason for Co-Treatment: Complexity of the patient's impairments (multi-system involvement)   OT goals addressed during session: ADL's and  self-care      AM-PAC OT "6 Clicks" Daily Activity     Outcome Measure Help from another person eating meals?: None Help from another person taking care of personal grooming?: A Little Help from another person toileting, which includes using toliet, bedpan, or urinal?: A Little Help from another person bathing (including washing, rinsing, drying)?: A Lot Help from another person to put on and taking off regular upper body clothing?: A Little Help from another person to put on and taking off regular lower body clothing?: A Lot 6 Click Score: 17   End of Session Equipment Utilized During Treatment: Engineer, water Communication: Mobility status  Activity Tolerance: Other (comment) (Limited by dizziness) Patient left: in chair;with call bell/phone within reach;with chair alarm set;with family/visitor present  OT Visit Diagnosis: Unsteadiness on feet (R26.81);Muscle weakness (generalized) (M62.81);Pain Pain - part of body: Ankle and joints of foot                Time: 1043-1056 OT Time Calculation (min): 13 min Charges:  OT General Charges $OT Visit: 1 Visit OT Evaluation $OT Eval Moderate Complexity: 1 Mod  03/16/2020  Rich, OTR/L  Acute Rehabilitation Services  Office:  [phone]  Adrien Dietzman D Frank Pilger 03/16/2020, 12:25 PM

## 2020-03-16 NOTE — Evaluation (Signed)
Physical Therapy Evaluation Patient Details Name: Jeffrey Serrano MRN: [id number] DOB: 1951-02-28 Today's Date: 03/16/2020   History of Present Illness  69 y.o. male with lower extremity claudication.  He has hypertension as a risk factor.  Patient takes Plavix now status post stent of right external iliac artery.  Underwent a BYPASS GRAFT RIGHT FEMORAL TO BELOW KNEE POPLITEAL ARTERY (Right) 12/29.  Was transferred to the ICU for R flank pain and hypotension after surgery.  Clinical Impression  Pt admitted with above diagnosis. Pt was able to transfer to chair but limited by dizziness. BP slightly soft but pt was not orthostatic and BP incr with activity. Pt was able to sit up in chair but did not feel up to walking this am.   Pt currently with functional limitations due to the deficits listed below (see PT Problem List). Pt will benefit from skilled PT to increase their independence and safety with mobility to allow discharge to the venue listed below.      Follow Up Recommendations Home health PT;Supervision/Assistance - 24 hour    Equipment Recommendations  Rolling walker with 5" wheels;3in1 (PT)    Recommendations for Other Services       Precautions / Restrictions Precautions Precautions: Fall Restrictions Weight Bearing Restrictions: No      Mobility  Bed Mobility Overal bed mobility: Needs Assistance Bed Mobility: Rolling;Supine to Sit Rolling: Supervision   Supine to sit: Min assist          Transfers Overall transfer level: Needs assistance Equipment used: Rolling walker (2 wheeled) Transfers: Sit to/from [facility] Sit to Stand: Min assist Stand pivot transfers: Min assist       General transfer comment: Pt required min assist for power up and for steadying for stand pivot transfer. Pt c/o dizziness throughout.  BP fluctuating but not orthostatic.  Ambulation/Gait             General Gait Details: unable today due to  dizziness  Stairs            Wheelchair Mobility    Modified Rankin (Stroke Patients Only)       Balance Overall balance assessment: Needs assistance Sitting-balance support: No upper extremity supported Sitting balance-Leahy Scale: Fair     Standing balance support: Bilateral upper extremity supported Standing balance-Leahy Scale: Poor Standing balance comment: relies on RW for external support                             Pertinent Vitals/Pain Pain Assessment: Faces Faces Pain Scale: Hurts a little bit Pain Location: R leg.  Patient is very stoic, and denies pain as any thing that will bother him. Pain Descriptors / Indicators: Tightness Pain Intervention(s): Limited activity within patient's tolerance;Monitored during session;Repositioned    Home Living Family/patient expects to be discharged to:: Private residence Living Arrangements: Other relatives;Other (Comment) (Brother) Available Help at Discharge: Family;Available 24 hours/day Type of Home: House Home Access: Stairs to enter Entrance Stairs-Rails: Psychiatric nurse of Steps: 2 Home Layout: One level Home Equipment: None      Prior Function Level of Independence: Independent         Comments: Patient stating he is retired, but works part time at nights for a [employer].  Patient states he had difficulty walking long distances, but did not use any DME.  He was able to care for himself.  His brother assists with community mobility.  Hand Dominance   Dominant Hand: Right    Extremity/Trunk Assessment   Upper Extremity Assessment Upper Extremity Assessment: Defer to OT evaluation    Lower Extremity Assessment Lower Extremity Assessment: Generalized weakness    Cervical / Trunk Assessment Cervical / Trunk Assessment: Normal  Communication   Communication: Other (comment);No difficulties (patient gives short answers and defers to his brother for certain  answers.)  Cognition Arousal/Alertness: Awake/alert Behavior During Therapy: Flat affect Overall Cognitive Status: Difficult to assess                                 General Comments: Patient followed commands, and appears appropriate, but deferred to his brother for certain questions.      General Comments General comments (skin integrity, edema, etc.): HR 90-91 bpm, supine BP 96/66,  sitting 105/76,  110/64 after transfer to chair.    Exercises     Assessment/Plan    PT Assessment Patient needs continued PT services  PT Problem List Decreased knowledge of precautions;Decreased safety awareness;Decreased knowledge of use of DME;Decreased mobility;Decreased balance;Decreased activity tolerance       PT Treatment Interventions DME instruction;Gait training;Functional mobility training;Therapeutic activities;Stair training;Therapeutic exercise;Balance training;Patient/family education    PT Goals (Current goals can be found in the Care Plan section)  Acute Rehab PT Goals Patient Stated Goal: to go home PT Goal Formulation: With patient Time For Goal Achievement: 03/30/20 Potential to Achieve Goals: Good    Frequency Min 3X/week   Barriers to discharge        Co-evaluation PT/OT/SLP Co-Evaluation/Treatment: Yes Reason for Co-Treatment: Complexity of the patient's impairments (multi-system involvement);For patient/therapist safety PT goals addressed during session: Mobility/safety with mobility OT goals addressed during session: ADL's and self-care       AM-PAC PT "6 Clicks" Mobility  Outcome Measure Help needed turning from your back to your side while in a flat bed without using bedrails?: None Help needed moving from lying on your back to sitting on the side of a flat bed without using bedrails?: A Little Help needed moving to and from a bed to a chair (including a wheelchair)?: A Little Help needed standing up from a chair using your arms (e.g.,  wheelchair or bedside chair)?: A Little Help needed to walk in hospital room?: A Lot Help needed climbing 3-5 steps with a railing? : Total 6 Click Score: 16    End of Session Equipment Utilized During Treatment: Gait belt Activity Tolerance: Patient limited by fatigue (limited by dizziness) Patient left: in chair;with call bell/phone within reach;with chair alarm set;with family/visitor present Nurse Communication: Mobility status PT Visit Diagnosis: Unsteadiness on feet (R26.81);Muscle weakness (generalized) (M62.81);Dizziness and giddiness (R42)    Time: 9622-2979 PT Time Calculation (min) (ACUTE ONLY): 22 min   Charges:   PT Evaluation $PT Eval Moderate Complexity: 1 Mod          Leon Goodnow W,PT Acute Rehabilitation Services Pager:  [phone]  Office:  [phone]    Berline Lopes 03/16/2020, 12:53 PM

## 2020-03-17 DIAGNOSIS — R571 Hypovolemic shock: Secondary | ICD-10-CM | POA: Diagnosis not present

## 2020-03-17 LAB — CBC
HCT: 27.7 % — ABNORMAL LOW (ref 39.0–52.0)
Hemoglobin: 9.7 g/dL — ABNORMAL LOW (ref 13.0–17.0)
MCH: 31.4 pg (ref 26.0–34.0)
MCHC: 35 g/dL (ref 30.0–36.0)
MCV: 89.6 fL (ref 80.0–100.0)
Platelets: 102 10*3/uL — ABNORMAL LOW (ref 150–400)
RBC: 3.09 MIL/uL — ABNORMAL LOW (ref 4.22–5.81)
RDW: 14.1 % (ref 11.5–15.5)
WBC: 13.2 10*3/uL — ABNORMAL HIGH (ref 4.0–10.5)
nRBC: 0 % (ref 0.0–0.2)

## 2020-03-17 LAB — BASIC METABOLIC PANEL
Anion gap: 9 (ref 5–15)
Anion gap: 10 (ref 5–15)
BUN: 13 mg/dL (ref 8–23)
BUN: 12 mg/dL (ref 8–23)
CO2: 23 mmol/L (ref 22–32)
CO2: 21 mmol/L — ABNORMAL LOW (ref 22–32)
Calcium: 8 mg/dL — ABNORMAL LOW (ref 8.9–10.3)
Calcium: 7.9 mg/dL — ABNORMAL LOW (ref 8.9–10.3)
Chloride: 103 mmol/L (ref 98–111)
Chloride: 101 mmol/L (ref 98–111)
Creatinine, Ser: 1.14 mg/dL (ref 0.61–1.24)
Creatinine, Ser: 1.12 mg/dL (ref 0.61–1.24)
GFR, Estimated: >60 mL/min (ref >60)
GFR, Estimated: >60 mL/min (ref >60)
Glucose, Bld: 130 mg/dL — ABNORMAL HIGH (ref 70–99)
Glucose, Bld: 123 mg/dL — ABNORMAL HIGH (ref 70–99)
Potassium: 3.4 mmol/L — ABNORMAL LOW (ref 3.5–5.1)
Potassium: 3.7 mmol/L (ref 3.5–5.1)
Sodium: 135 mmol/L (ref 135–145)
Sodium: 132 mmol/L — ABNORMAL LOW (ref 135–145)

## 2020-03-17 LAB — GLUCOSE, CAPILLARY
Glucose-Capillary: 108 mg/dL — ABNORMAL HIGH (ref 70–99)
Glucose-Capillary: 109 mg/dL — ABNORMAL HIGH (ref 70–99)
Glucose-Capillary: 114 mg/dL — ABNORMAL HIGH (ref 70–99)
Glucose-Capillary: 118 mg/dL — ABNORMAL HIGH (ref 70–99)
Glucose-Capillary: 137 mg/dL — ABNORMAL HIGH (ref 70–99)
Glucose-Capillary: 93 mg/dL (ref 70–99)
Glucose-Capillary: 98 mg/dL (ref 70–99)

## 2020-03-17 MED ORDER — POTASSIUM CHLORIDE 10 MEQ/50ML IV SOLN
10.0000 meq | INTRAVENOUS | Status: DC
  Filled 2020-03-17 (×3): qty 50

## 2020-03-17 NOTE — Plan of Care (Signed)
  Problem: Education: Goal: Knowledge of General Education information will improve Description: Including pain rating scale, medication(s)/side effects and non-pharmacologic comfort measures Outcome: Progressing   Problem: Health Behavior/Discharge Planning: Goal: Ability to manage health-related needs will improve Outcome: Progressing   Problem: Clinical Measurements: Goal: Ability to maintain clinical measurements within normal limits will improve Outcome: Progressing Goal: Will remain free from infection Outcome: Progressing Goal: Cardiovascular complication will be avoided Outcome: Progressing   Problem: Pain Managment: Goal: General experience of comfort will improve Outcome: Progressing   Problem: Skin Integrity: Goal: Risk for impaired skin integrity will decrease Outcome: Progressing

## 2020-03-17 NOTE — Progress Notes (Signed)
Mobility Specialist: Progress Note   03/17/20 1734  Mobility  Activity Ambulated in hall  Level of Assistance +2 (takes two people)  Assistive Device Front wheel walker  Distance Ambulated (ft) 120 ft  Mobility Response Tolerated well  Mobility performed by Mobility specialist  $Mobility charge 1 Mobility   Pre-Mobility:            Sitting: 99 HR, 120/78 BP, 98% SpO2           Standing: 122/72 BP Post-Mobility: 105 HR, 128/72 BP  Pt ambulated in hallway with chair follow. Pt is +2 assist to stand. Pt c/o 10/10 pain in RLE during ambulation, RN present.   [facility] Danika Kluender Mobility Specialist

## 2020-03-17 NOTE — Progress Notes (Signed)
Patient arrived to 68E08 from [facility]. VSS and patient denies any pain. CHG bath performed and patient placed on telemetry monitor. CCMD notified. Upon assessment, strong pulses dopplered in right dorsalis pedis and posterior tibial, and extremity is warm and dry. Dressing c/d/i. This RN and another RN attempted to doppler dorsalis pedis and posterior tibial pulses in left foot, however were unable to locate pulses. A third RN was able to hear faint, inconsistent signal for the left dorsalis pedis. Posterior tibial or popliteal pulses on the left side remain absent to doppler attempts. Leg is cold and dry. Patient denies pain or change in sensation the left leg. Vascular PA and MD made aware. Will continue to monitor closely.  Allegra Grana RN

## 2020-03-17 NOTE — Progress Notes (Signed)
Report given to Florentina Addison, RN 4 E receiving nurse.  PT in room working w/Pt.  MAP WNL.   Patient transported to 4708 via Chair by SWOT RN. VSS. NAD.

## 2020-03-17 NOTE — Progress Notes (Signed)
NAME:  Jeffrey Serrano, MRN:  [id number], DOB:  Jul 19, 1950, LOS: 2 ADMISSION DATE:  03/15/2020, CONSULTATION DATE: 03/15/2020 REFERRING MD: Lenell Antu -vascular surgery, CHIEF COMPLAINT: Shock  HPI/course in hospital  69 year old man who underwent right common femoral and profunda endarterectomies with vein patch and right common femoral to peroneal bypass for critical limb ischemia.  He had presented with right lower extremity rest pain and mono phasic ABIs.  Surgery was complicated given highly calcified vessels.  In recovery patient began to complain of right flank pain.  On arrival in the intensive care unit was found to be hypotensive with systolic blood pressures in the 70s.  He complained of dizziness and right flank pain.   Past Medical History   Past Medical History:  Diagnosis Date  . Arthritis   . Hepatitis C   . High cholesterol   . Hypertension   . PVD (peripheral vascular disease) (HCC)   . Seizures (HCC)    most recent Jan 2020     Past Surgical History:  Procedure Laterality Date  . ABDOMINAL AORTOGRAM W/LOWER EXTREMITY N/A 01/24/2020   Procedure: ABDOMINAL AORTOGRAM W/LOWER EXTREMITY;  Surgeon: Maeola Harman, MD;  Location: [facility] INVASIVE CV LAB;  Service: Cardiovascular;  Laterality: N/A;  . FEMORAL-POPLITEAL BYPASS GRAFT Right 03/15/2020   Procedure: BYPASS GRAFT RIGHT FEMORAL TO BELOW KNEE POPLITEAL ARTERY USING 67mm REMOVABLE RING PROPATEN GORE GRAFT;  Surgeon: Maeola Harman, MD;  Location: [facility] OR;  Service: Vascular;  Laterality: Right;  . PERIPHERAL VASCULAR INTERVENTION Right 01/24/2020   Procedure: PERIPHERAL VASCULAR INTERVENTION;  Surgeon: Maeola Harman, MD;  Location: [facility] INVASIVE CV LAB;  Service: Cardiovascular;  Laterality: Right;  external iliac   Interim history/subjective:  Feels better today. Flank pain has resolved and pressors weaned off.  Appetite has improved  Objective   Blood pressure 112/70, pulse 96,  temperature 98.6 F (37 C), temperature source Oral, resp. rate (!) 23, height 5\' 8"  (1.727 m), weight 75.8 kg, SpO2 99 %.        Intake/Output Summary (Last 24 hours) at 03/17/2020 1033 Last data filed at 03/17/2020 1000 Gross per 24 hour  Intake 607 ml  Output 1521 ml  Net -914 ml   Filed Weights   03/15/20 0550 03/17/20 0500  Weight: 71.8 kg 75.8 kg   Examination:  Physical Exam Constitutional:      Appearance: He is well-developed.  HENT:     Head:     Comments: Moist mucous membranes Eyes:     Comments: Pale conjunctivae  Neck:     Vascular: JVD P at 2 cm above sternal angle Cardiac:      HS normal, tachycardia has resolved.      Mild generalized edema Pulmonary:     Effort: Pulmonary effort is normal.     Breath sounds: Normal breath sounds.  Abdominal:     General: Abdomen is protuberant. There is distension.     Tenderness: There is no abdominal tenderness.     Comments: No flank hematoma.  Groin incision clean with no ecchymoses or swelling.  No tenderness to palpation.  Genitourinary:    Comments: Foley catheter in place, improved urine output Neurological:     General: No focal deficit present.     Mental Status: He is alert.  Psychiatric:        Behavior: Behavior is cooperative.   Ancillary tests (personally reviewed)  CBC: Recent Labs  Lab 03/15/20 1619 03/15/20 1740 03/16/20 0013 03/16/20 0403 03/16/20 1137  03/17/20 0315  WBC 23.7*  --  15.4* 11.8* 11.9* 13.2*  HGB 8.2* 7.1* 11.3* 10.7* 11.3* 9.7*  HCT 24.9* 21.0* 31.5* 31.4* 32.7* 27.7*  MCV 94.0  --  87.3 88.5 88.4 89.6  PLT 179  --  127* 111* 130* 102*    Basic Metabolic Panel: Recent Labs  Lab 03/15/20 1619 03/15/20 1740 03/16/20 0013 03/16/20 0403 03/17/20 0315  NA  --  137 133* 135 135  K  --  3.9 4.0 3.8 3.4*  CL  --   --  106 106 103  CO2  --   --  20* 21* 23  GLUCOSE  --   --  128* 100* 130*  BUN  --   --  11 11 13   CREATININE 1.24  --  1.24 1.10 1.14  CALCIUM  --    --  8.0* 8.0* 8.0*   GFR: Estimated Creatinine Clearance: 58.3 mL/min (by C-G formula based on SCr of 1.14 mg/dL). Recent Labs  Lab 03/15/20 1712 03/16/20 0013 03/16/20 0358 03/16/20 0403 03/16/20 1137 03/17/20 0315  WBC  --  15.4*  --  11.8* 11.9* 13.2*  LATICACIDVEN 5.9*  --  2.1*  --   --   --     Liver Function Tests: Recent Labs  Lab 03/16/20 0013  AST 80*  ALT 17  ALKPHOS 32*  BILITOT 1.0  PROT 4.9*  ALBUMIN 2.9*   No results for input(s): LIPASE, AMYLASE in the last 168 hours. No results for input(s): AMMONIA in the last 168 hours.  ABG    Component Value Date/Time   PHART 7.364 03/15/2020 1740   PCO2ART 29.6 (L) 03/15/2020 1740   PO2ART 59 (L) 03/15/2020 1740   HCO3 17.0 (L) 03/15/2020 1740   TCO2 18 (L) 03/15/2020 1740   ACIDBASEDEF 8.0 (H) 03/15/2020 1740   O2SAT 90.0 03/15/2020 1740    Coagulation Profile: No results for input(s): INR, PROTIME in the last 168 hours.  Cardiac Enzymes: No results for input(s): CKTOTAL, CKMB, CKMBINDEX, TROPONINI in the last 168 hours.  HbA1C: Hgb A1c MFr Bld  Date/Time Value Ref Range Status  01/07/2020 11:42 AM 6.6 (H) 4.8 - 5.6 % Final    Comment:             Prediabetes: 5.7 - 6.4          Diabetes: >6.4          Glycemic control for adults with diabetes: <7.0   06/26/2018 11:25 AM 6.0 (H) 4.8 - 5.6 % Final    Comment:             Prediabetes: 5.7 - 6.4          Diabetes: >6.4          Glycemic control for adults with diabetes: <7.0    CBG: Recent Labs  Lab 03/16/20 1545 03/16/20 2013 03/17/20 0031 03/17/20 0343 03/17/20 0649  GLUCAP 110* 96 114* 137* 118*   EKG 12/29 (personal interpretation): Sinus tachycardia with lateral ST depressions which may be rate related represent demand related ischemia.  No evidence of ST elevation acute coronary syndrome  Assessment & Plan:   Was critically ill due to mixed hypovolemic and distributive shock due to combination of preoperative dehydration,  intraoperative insensitive losses and possible perioperative vasodilatation due to limb reperfusion. At risk for but no evidence at present of perioperative bleeding. Demand related troponin rise, but no evidence to suggest major perioperative cardiac event. Peripheral vascular disease status post right  femoral-popliteal bypass Central hypertension Well-controlled seizure disorder  Plan:  -Remove central line, remove Foley catheter. -Hold antihypertensive agents for now -No fluids or diuresis today, allow autoregulation given normal renal function. -Ready for transfer.  Discussed with vascular surgery.  -PCCM will sign off.    Kipp Brood, MD [facility] ICU Physician [place]  Pager: [phone] Or Epic Secure Chat After hours: [phone].  03/17/2020, 10:33 AM

## 2020-03-17 NOTE — Progress Notes (Signed)
Physical Therapy Treatment Patient Details Name: Jeffrey Serrano MRN: [id number] DOB: Jan 16, 1951 Today's Date: 03/17/2020    History of Present Illness 69 y.o. male with lower extremity claudication.  He has hypertension as a risk factor.  Patient takes Plavix now status post stent of right external iliac artery.  Underwent a BYPASS GRAFT RIGHT FEMORAL TO BELOW KNEE POPLITEAL ARTERY (Right) 12/29.  Was transferred to the ICU for R flank pain and hypotension after surgery.    PT Comments    Pt admitted with above diagnosis. Pt able to ambulate today with RW with min guard to min assist as he was intially needing steadying assist. Pt needed chair follow and sat after 90 feet.  Pt's blood pressure stable today and pt did not c/o dizziness.  Progressing well.   Pt currently with functional limitations due to balance and endurance deficits. Pt will benefit from skilled PT to increase their independence and safety with mobility to allow discharge to the venue listed below.     Follow Up Recommendations  Home health PT;Supervision/Assistance - 24 hour     Equipment Recommendations  Rolling walker with 5" wheels;3in1 (PT)    Recommendations for Other Services       Precautions / Restrictions Precautions Precautions: Fall Restrictions Weight Bearing Restrictions: No    Mobility  Bed Mobility               General bed mobility comments: in chair on arrival  Transfers Overall transfer level: Needs assistance Equipment used: Rolling walker (2 wheeled) Transfers: Sit to/from [employer] Transfers Sit to Stand: Min assist         General transfer comment: Pt required min assist for power up and for steadying. Pt c/o dizziness initially but it resolved during his walk.  Ambulation/Gait Ambulation/Gait assistance: Min guard;Min assist;+2 safety/equipment Gait Distance (Feet): 90 Feet Assistive device: Rolling walker (2 wheeled) Gait Pattern/deviations: Step-to  pattern;Decreased stance time - right;Decreased weight shift to right;Decreased step length - left;Drifts right/left;Wide base of support;Antalgic   Gait velocity interpretation: <1.31 ft/sec, indicative of household ambulator General Gait Details: Pt progressed distance with ambulation and was able to get into hallway with RW and min guard assist most of walk once he initiated movement.  Did follow with chair and pt did need to sit after 90 feet.  Needed cues to stand tall as well as cues to slow down as pt tends to rush at times. Nursing allowed PT to walk pt prior to them moving him to new floor. Left him in chair at door as they were transporting him after this walk.   Stairs             Wheelchair Mobility    Modified Rankin (Stroke Patients Only)       Balance           Standing balance support: Bilateral upper extremity supported Standing balance-Leahy Scale: Poor Standing balance comment: relies on RW for external support                            Cognition Arousal/Alertness: Awake/alert Behavior During Therapy: Flat affect Overall Cognitive Status: Difficult to assess                                 General Comments: Pt at times appears slightly confused. Was oriented to place, time and situation but stated he  had walked earlier and nurse states he did not.      Exercises General Exercises - Lower Extremity Ankle Circles/Pumps: Both;AROM;Seated;10 reps Long Arc Quad: AROM;Both;10 reps;Seated    General Comments General comments (skin integrity, edema, etc.): 120/72 initial BP, 118/85 sitting, 123/72 at end of session.      Pertinent Vitals/Pain Pain Assessment: Faces Faces Pain Scale: Hurts a little bit Pain Location: R leg.  Patient is very stoic, and denies pain as any thing that will bother him. Pain Descriptors / Indicators: Tightness Pain Intervention(s): Limited activity within patient's tolerance;Monitored during  session;Repositioned;Premedicated before session    Home Living                      Prior Function            PT Goals (current goals can now be found in the care plan section) Acute Rehab PT Goals Patient Stated Goal: to go home Progress towards PT goals: Progressing toward goals    Frequency    Min 3X/week      PT Plan Current plan remains appropriate    Co-evaluation              AM-PAC PT "6 Clicks" Mobility   Outcome Measure  Help needed turning from your back to your side while in a flat bed without using bedrails?: None Help needed moving from lying on your back to sitting on the side of a flat bed without using bedrails?: A Little Help needed moving to and from a bed to a chair (including a wheelchair)?: A Little Help needed standing up from a chair using your arms (e.g., wheelchair or bedside chair)?: A Little Help needed to walk in hospital room?: A Little Help needed climbing 3-5 steps with a railing? : Total 6 Click Score: 17    End of Session Equipment Utilized During Treatment: Gait belt Activity Tolerance: Patient limited by fatigue Patient left: in chair;with call bell/phone within reach (at door as nursing moving him to new unit) Nurse Communication: Mobility status PT Visit Diagnosis: Unsteadiness on feet (R26.81);Muscle weakness (generalized) (M62.81);Dizziness and giddiness (R42)     Time: [id number] PT Time Calculation (min) (ACUTE ONLY): 14 min  Charges:  $Gait Training: 8-22 mins                     Jeffrey Serrano W,PT [place] Pager:  [phone]  Office:  [place] 03/17/2020, 2:04 PM

## 2020-03-17 NOTE — Progress Notes (Signed)
[facility] [place]  Subjective  - right leg swollen, still dizzy when standing  Objective 105/73 99 98.6 F (37 C) (Oral) (!) 28 96%  Intake/Output Summary (Last 24 hours) at 03/17/2020 0859 Last data filed at 03/17/2020 0700 Gross per 24 hour  Intake 360 ml  Output 1411 ml  Net -1051 ml   Right leg foot warm mild edema Incisions clean no obvious hematoma  Assessment/Planning: POD 2 Fem peroneal with PTFE Still with low BP 100s today and dizzy Holding all BP meds D/c Foley Transfer to 4E hopefully home next few days as BP improves Leukocytosis thrombocytopenia trend for now  Fabienne Bruns 03/17/2020 8:59 AM --  Laboratory Lab Results: Recent Labs    03/16/20 1137 03/17/20 0315  WBC 11.9* 13.2*  HGB 11.3* 9.7*  HCT 32.7* 27.7*  PLT 130* 102*   BMET Recent Labs    03/16/20 0403 03/17/20 0315  NA 135 135  K 3.8 3.4*  CL 106 103  CO2 21* 23  GLUCOSE 100* 130*  BUN 11 13  CREATININE 1.10 1.14  CALCIUM 8.0* 8.0*    COAG Lab Results  Component Value Date   INR 1.1 03/08/2020   INR 1.1 06/11/2019   INR 1.0 08/07/2016   No results found for: PTT

## 2020-03-18 LAB — CBC
HCT: 27.7 % — ABNORMAL LOW (ref 39.0–52.0)
Hemoglobin: 9.1 g/dL — ABNORMAL LOW (ref 13.0–17.0)
MCH: 30.1 pg (ref 26.0–34.0)
MCHC: 32.9 g/dL (ref 30.0–36.0)
MCV: 91.7 fL (ref 80.0–100.0)
Platelets: 121 10*3/uL — ABNORMAL LOW (ref 150–400)
RBC: 3.02 MIL/uL — ABNORMAL LOW (ref 4.22–5.81)
RDW: 13.9 % (ref 11.5–15.5)
WBC: 12.6 10*3/uL — ABNORMAL HIGH (ref 4.0–10.5)
nRBC: 0 % (ref 0.0–0.2)

## 2020-03-18 LAB — BASIC METABOLIC PANEL
Anion gap: 9 (ref 5–15)
BUN: 12 mg/dL (ref 8–23)
CO2: 23 mmol/L (ref 22–32)
Calcium: 8.1 mg/dL — ABNORMAL LOW (ref 8.9–10.3)
Chloride: 102 mmol/L (ref 98–111)
Creatinine, Ser: 1.12 mg/dL (ref 0.61–1.24)
GFR, Estimated: >60 mL/min (ref >60)
Glucose, Bld: 112 mg/dL — ABNORMAL HIGH (ref 70–99)
Potassium: 3.8 mmol/L (ref 3.5–5.1)
Sodium: 134 mmol/L — ABNORMAL LOW (ref 135–145)

## 2020-03-18 LAB — GLUCOSE, CAPILLARY
Glucose-Capillary: 100 mg/dL — ABNORMAL HIGH (ref 70–99)
Glucose-Capillary: 103 mg/dL — ABNORMAL HIGH (ref 70–99)
Glucose-Capillary: 114 mg/dL — ABNORMAL HIGH (ref 70–99)
Glucose-Capillary: 81 mg/dL (ref 70–99)
Glucose-Capillary: 90 mg/dL (ref 70–99)
Glucose-Capillary: 92 mg/dL (ref 70–99)

## 2020-03-18 NOTE — Progress Notes (Addendum)
Progress Note    03/18/2020 8:38 AM 3 Days Post-Op  Subjective:  Pain contorolled.  He states he was OOB with PT yesterday without dizziness, near-syncope or SOB. Voiding spontaneously.  Vitals:   03/18/20 0341 03/18/20 0700  BP: 115/72 100/71  Pulse: 88 74  Resp: (!) 22 20  Temp: 98.7 F (37.1 C) 98.4 F (36.9 C)  SpO2: 94% 96%   tele NSR last 24 hours sys BP 90s - low 100s approx 2L UOP  Physical Exam: Cardiac:  RRR Lungs:  nonlabored Incisions:  All well approximated. Some serousang drainage from right lower leg incision. Extremities: Motor and sensation intactbilaterally. Mild right LE edema. Brisk AT, PT and peroneal signals on right and dampened signals on left times 3. No open wounds.   CBC    Component Value Date/Time   WBC 12.6 (H) 03/18/2020 0420   RBC 3.02 (L) 03/18/2020 0420   HGB 9.1 (L) 03/18/2020 0420   HGB 17.0 12/09/2018 1606   HCT 27.7 (L) 03/18/2020 0420   HCT 47.8 12/09/2018 1606   PLT 121 (L) 03/18/2020 0420   PLT 243 12/09/2018 1606   MCV 91.7 03/18/2020 0420   MCV 94 12/09/2018 1606   MCH 30.1 03/18/2020 0420   MCHC 32.9 03/18/2020 0420   RDW 13.9 03/18/2020 0420   RDW 12.8 12/09/2018 1606   LYMPHSABS 4.1 (H) 12/09/2018 1606   MONOABS 1,161 (H) 08/07/2016 1403   EOSABS 0.1 12/09/2018 1606   BASOSABS 0.1 12/09/2018 1606    BMET    Component Value Date/Time   NA 134 (L) 03/18/2020 0420   NA 138 01/07/2020 1142   K 3.8 03/18/2020 0420   CL 102 03/18/2020 0420   CO2 23 03/18/2020 0420   GLUCOSE 112 (H) 03/18/2020 0420   BUN 12 03/18/2020 0420   BUN 11 01/07/2020 1142   CREATININE 1.12 03/18/2020 0420   CREATININE 1.42 (H) 12/20/2019 1421   CALCIUM 8.1 (L) 03/18/2020 0420   GFRNONAA >60 03/18/2020 0420   GFRNONAA 44 (L) 08/07/2016 1403   GFRAA 62 01/07/2020 1142   GFRAA 50 (L) 08/07/2016 1403     Intake/Output Summary (Last 24 hours) at 03/18/2020 0838 Last data filed at 03/18/2020 [id number] Gross per 24 hour  Intake 447 ml   Output 1780 ml  Net -1333 ml    HOSPITAL MEDICATIONS Scheduled Meds: . aspirin EC  81 mg Oral Daily  . Chlorhexidine Gluconate Cloth  6 each Topical Daily  . cholecalciferol  2,000 Units Oral Daily  . clopidogrel  75 mg Oral Daily  . docusate sodium  100 mg Oral Daily  . gabapentin  100 mg Oral Daily  . heparin  5,000 Units Subcutaneous Q8H  . insulin aspart  2-6 Units Subcutaneous Q4H  . lacosamide  150 mg Oral q AM  . lacosamide  200 mg Oral QHS  . pantoprazole  40 mg Oral Daily  . rosuvastatin  20 mg Oral Daily   Continuous Infusions: . magnesium sulfate bolus IVPB     PRN Meds:.acetaminophen **OR** acetaminophen, alum & mag hydroxide-simeth, bisacodyl, fentaNYL (SUBLIMAZE) injection, guaiFENesin-dextromethorphan, hydrALAZINE, HYDROcodone-acetaminophen, labetalol, magnesium sulfate bolus IVPB, metoprolol tartrate, ondansetron, phenol, polyethylene glycol  Assessment: POD 3 right fem to BK pop bypass with PTFE for RLE ischemia with rest pain. RLE  well perfused with mild edema. Afebrile.  Post-op symptomatic hypotension: symptoms resolved currently  Acute blood loss anemia: relatively stable. No indication for transfusion  Plan: -Continue hold on BP meds. Continue to monitor BP -Continue  asa, statin, Plavix -CBC in AM -Dispo: HHPT -DVT prophylaxis:  heparin [place]   Wendi Maya, PA-C [facility] [phone] 03/18/2020  8:38 AM   Agree with above Good doppler signals in right foot Has baseline chronic ischemia left foot no real symptoms Incisions healing some serous drainge from edema Ambulated some yesterday according to pt D/c home when BP stable on meds  Fabienne Bruns, MD [facility] [place] Office: [phone]

## 2020-03-18 NOTE — Progress Notes (Signed)
Mobility Specialist: Progress Note   03/18/20 1118  Mobility  Activity Ambulated in hall  Level of Assistance Moderate assist, patient does 50-74%  Assistive Device Front wheel walker  Distance Ambulated (ft) 364 ft  Mobility Response Tolerated well  Mobility performed by Mobility specialist  Bed Position Chair  $Mobility charge 1 Mobility   Pre-Mobility:            Supine: 94 HR, 103/69 BP, 100% SpO2           Sitting: 96/68 BP           Standing: 117/77 BP Post-Mobility: 104 HR, 109/76 BP, 100% SpO2  Pt c/o 10/10 pain in RLE during ambulation. Pt said he feels much better today than he did yesterday during ambulation. Encouraged pt to continue walks with staff. Pt to chair with call bell and phone.   [facility] Shepherd Finnan Mobility Specialist

## 2020-03-19 LAB — GLUCOSE, CAPILLARY
Glucose-Capillary: 91 mg/dL (ref 70–99)
Glucose-Capillary: 104 mg/dL — ABNORMAL HIGH (ref 70–99)
Glucose-Capillary: 80 mg/dL (ref 70–99)

## 2020-03-19 LAB — CBC
HCT: 25.8 % — ABNORMAL LOW (ref 39.0–52.0)
Hemoglobin: 8.6 g/dL — ABNORMAL LOW (ref 13.0–17.0)
MCH: 30.7 pg (ref 26.0–34.0)
MCHC: 33.3 g/dL (ref 30.0–36.0)
MCV: 92.1 fL (ref 80.0–100.0)
Platelets: 142 10*3/uL — ABNORMAL LOW (ref 150–400)
RBC: 2.8 MIL/uL — ABNORMAL LOW (ref 4.22–5.81)
RDW: 13.7 % (ref 11.5–15.5)
WBC: 11.3 10*3/uL — ABNORMAL HIGH (ref 4.0–10.5)
nRBC: 0 % (ref 0.0–0.2)

## 2020-03-19 NOTE — Progress Notes (Signed)
Mobility Specialist: Progress Note   03/19/20 1211  Mobility  Activity Ambulated in hall  Level of Assistance Minimal assist, patient does 75% or more  Assistive Device Front wheel walker  Distance Ambulated (ft) 364 ft  Mobility Response Tolerated well  Mobility performed by Mobility specialist  Bed Position Chair  $Mobility charge 1 Mobility   Pre-Mobility: 78 HR, 114/72 BP Post-Mobility: 97 HR, 107/60 BP, 100% SpO2  Pt c/o pain in his RLE, no rating given. Pt said he feels better after walking. Encouraged pt to walk at least 1 more time today with staff.   [facility] Pearley Baranek Mobility Specialist

## 2020-03-19 NOTE — Progress Notes (Addendum)
Progress Note    03/19/2020 7:45 AM 4 Days Post-Op  Subjective:  Complaining of urinary hesitancy. Ambulated in hallway without presyncope, but lots of pain.   Vitals:   03/19/20 0340 03/19/20 0743  BP: 112/68 105/73  Pulse: 85 92  Resp: 18 20  Temp: 99 F (37.2 C) 98 F (36.7 C)  SpO2: 95% 96%    Physical Exam: Cardiac:  RRR Lungs:  nonlabored Incisions:  Dressings in place are dry>lifted and replaced. All well approximated.  Extremities: Motor and sensation intactbilaterally. Moderate right thigh edema. Brisk AT, PT and peroneal signals on right. Dampened signals on left.  CBC    Component Value Date/Time   WBC 11.3 (H) 03/19/2020 0250   RBC 2.80 (L) 03/19/2020 0250   HGB 8.6 (L) 03/19/2020 0250   HGB 17.0 12/09/2018 1606   HCT 25.8 (L) 03/19/2020 0250   HCT 47.8 12/09/2018 1606   PLT 142 (L) 03/19/2020 0250   PLT 243 12/09/2018 1606   MCV 92.1 03/19/2020 0250   MCV 94 12/09/2018 1606   MCH 30.7 03/19/2020 0250   MCHC 33.3 03/19/2020 0250   RDW 13.7 03/19/2020 0250   RDW 12.8 12/09/2018 1606   LYMPHSABS 4.1 (H) 12/09/2018 1606   MONOABS 1,161 (H) 08/07/2016 1403   EOSABS 0.1 12/09/2018 1606   BASOSABS 0.1 12/09/2018 1606    BMET    Component Value Date/Time   NA 134 (L) 03/18/2020 0420   NA 138 01/07/2020 1142   K 3.8 03/18/2020 0420   CL 102 03/18/2020 0420   CO2 23 03/18/2020 0420   GLUCOSE 112 (H) 03/18/2020 0420   BUN 12 03/18/2020 0420   BUN 11 01/07/2020 1142   CREATININE 1.12 03/18/2020 0420   CREATININE 1.42 (H) 12/20/2019 1421   CALCIUM 8.1 (L) 03/18/2020 0420   GFRNONAA >60 03/18/2020 0420   GFRNONAA 44 (L) 08/07/2016 1403   GFRAA 62 01/07/2020 1142   GFRAA 50 (L) 08/07/2016 1403     Intake/Output Summary (Last 24 hours) at 03/19/2020 0745 Last data filed at 03/19/2020 0514 Gross per 24 hour  Intake 480 ml  Output 380 ml  Net 100 ml    HOSPITAL MEDICATIONS Scheduled Meds: . aspirin EC  81 mg Oral Daily  . Chlorhexidine  Gluconate Cloth  6 each Topical Daily  . cholecalciferol  2,000 Units Oral Daily  . clopidogrel  75 mg Oral Daily  . docusate sodium  100 mg Oral Daily  . gabapentin  100 mg Oral Daily  . heparin  5,000 Units Subcutaneous Q8H  . insulin aspart  2-6 Units Subcutaneous Q4H  . lacosamide  150 mg Oral q AM  . lacosamide  200 mg Oral QHS  . pantoprazole  40 mg Oral Daily  . rosuvastatin  20 mg Oral Daily   Continuous Infusions: . magnesium sulfate bolus IVPB     PRN Meds:.acetaminophen **OR** acetaminophen, alum & mag hydroxide-simeth, bisacodyl, fentaNYL (SUBLIMAZE) injection, guaiFENesin-dextromethorphan, hydrALAZINE, HYDROcodone-acetaminophen, labetalol, magnesium sulfate bolus IVPB, metoprolol tartrate, ondansetron, phenol, polyethylene glycol  Assessment: POD 4 right fem to BK pop bypass with PTFE for RLE ischemia with rest pain. RLE  well perfused with moderate thigh edema.  Post-op symptomatic hypotension: symptoms resolved currently. sys BP in 100-110 range  Acute blood loss anemia: 11.3>9.7>9.1>8.6. No indication for transfusion   Plan: -Continue hold on BP meds. Continue to monitor BP -Might benefit form Flomax for urinary hesitancy. Would like to see BP a bit better first. -Continue asa, statin, Plavix -Dispo: HHPT -  DVT prophylaxis:  heparin [place]   SANDRA SETZER, PA-C [facility] [phone] 03/19/2020  7:45 AM   Swelling of penis but able to urinate Left leg still swollen most likely reactive or hematoma but will duplex to rule out DVT BP still relatively low off all BP meds Home next 1-2 days BP and swelling are main obstacles  Ruta Hinds, MD [facility] [place] Office: [phone]

## 2020-03-20 ENCOUNTER — Inpatient Hospital Stay (HOSPITAL_COMMUNITY): Payer: PPO

## 2020-03-20 DIAGNOSIS — Z9889 Other specified postprocedural states: Secondary | ICD-10-CM | POA: Diagnosis not present

## 2020-03-20 MED ORDER — HEPARIN (PORCINE) 25000 UT/250ML-% IV SOLN
1100.0000 [IU]/h | INTRAVENOUS | Status: DC
  Administered 2020-03-20: 1100 [IU]/h via INTRAVENOUS
  Filled 2020-03-20: qty 250

## 2020-03-20 MED ORDER — HEPARIN BOLUS VIA INFUSION
2000.0000 [IU] | Freq: Once | INTRAVENOUS | Status: AC
  Administered 2020-03-20: 2000 [IU] via INTRAVENOUS
  Filled 2020-03-20: qty 2000

## 2020-03-20 NOTE — Progress Notes (Signed)
Right lower extremity venous duplex completed. Refer to "CV Proc" under chart review to view preliminary results.  Preliminary results discussed with Wendi Maya, PA-C via OR staff.  03/20/2020 11:18 AM Eula Fried., MHA, RVT, RDCS, RDMS

## 2020-03-20 NOTE — Progress Notes (Signed)
ANTICOAGULATION CONSULT NOTE  Pharmacy Consult for heparin Indication: DVT  No Known Allergies  Patient Measurements: Height: 5\' 8"  (172.7 cm) Weight: 75.8 kg (167 lb 1.7 oz) IBW/kg (Calculated) : 68.4 Heparin Dosing Weight: 76kg  Vital Signs: Temp: 97.8 F (36.6 C) (01/03 0832) Temp Source: Oral (01/03 0832) BP: 123/75 (01/03 0832) Pulse Rate: 85 (01/03 0832)  Labs: Recent Labs    03/18/20 0420 03/19/20 0250  HGB 9.1* 8.6*  HCT 27.7* 25.8*  PLT 121* 142*  CREATININE 1.12  --     Estimated Creatinine Clearance: 59.4 mL/min (by C-G formula based on SCr of 1.12 mg/dL).   Medical History: Past Medical History:  Diagnosis Date  . Arthritis   . Hepatitis C   . High cholesterol   . Hypertension   . PVD (peripheral vascular disease) (HCC)   . Seizures (HCC)    most recent Jan 2020     Assessment: 56 yoM admitted for R fem-pop bypass now with acute R DVT. No AC prior to admission, pt has been receiving DVT prophylaxis with subcutaneous heparin (last dose today ~1330). No CBC today but has been trending down,  Goal of Therapy:  Heparin level 0.3-0.7 units/ml Monitor platelets by anticoagulation protocol: Yes   Plan:  -Heparin 2000 units x1 - small bolus with recent SQ heparin dose and ongoing ABLA -Heparin 1100 units/h -Check heparin level in 8h -Daily heparin level and CBC   65, PharmD, BCPS, [facility] Clinical Pharmacist [phone] Please check AMION for all [facility] Pharmacy numbers 03/20/2020

## 2020-03-20 NOTE — Care Management Important Message (Signed)
Important Message  Patient Details  Name: Jeffrey Serrano MRN: [id number] Date of Birth: 1950-10-23   Medicare Important Message Given:  Yes     Renie Ora 03/20/2020, 10:08 AM

## 2020-03-20 NOTE — Progress Notes (Signed)
Mobility Specialist: Progress Note   03/20/20 1505  Mobility  Activity Ambulated in hall  Level of Assistance Minimal assist, patient does 75% or more  Assistive Device Front wheel walker  Distance Ambulated (ft) 310 ft  Mobility Response Tolerated well  Mobility performed by Mobility specialist  Bed Position Chair  $Mobility charge 1 Mobility   Pre-Mobility: 96 HR, 126/74 BP, 98% SpO2 Post-Mobility: 101 HR, 119/68 BP  Pt said his RLE pain is improving with ambulation. Pt motivated to continue ambulation with staff.   [facility] Charley Miske Mobility Specialist

## 2020-03-20 NOTE — Plan of Care (Signed)
  Problem: Health Behavior/Discharge Planning: Goal: Ability to manage health-related needs will improve Outcome: Progressing   

## 2020-03-20 NOTE — Progress Notes (Addendum)
Progress Note    03/20/2020 8:00 AM 5 Days Post-Op  Subjective:  Still with some urinary hesitancy, but can empty bladder as long as he sits on side of bed. Tolerating diet and is ambulating.   Vitals:   03/19/20 2318 03/20/20 0445  BP: 96/69 103/69  Pulse: 85 78  Resp: 14 (!) 22  Temp: 98.8 F (37.1 C) 98.6 F (37 C)  SpO2: 100% 100%   UOP: 815 cc  Physical Exam: Cardiac:  RRR Lungs:  nonlabored Incisions:  Right groin incision well approximated. Slight serosang drainage on right proximal thigh dressing. Remaining dressings are dry. Extremities:  Mild to moderate edema of right thigh, scrotum and penis. + AT and peroneal and very brisk PT Doppler signal   CBC    Component Value Date/Time   WBC 11.3 (H) 03/19/2020 0250   RBC 2.80 (L) 03/19/2020 0250   HGB 8.6 (L) 03/19/2020 0250   HGB 17.0 12/09/2018 1606   HCT 25.8 (L) 03/19/2020 0250   HCT 47.8 12/09/2018 1606   PLT 142 (L) 03/19/2020 0250   PLT 243 12/09/2018 1606   MCV 92.1 03/19/2020 0250   MCV 94 12/09/2018 1606   MCH 30.7 03/19/2020 0250   MCHC 33.3 03/19/2020 0250   RDW 13.7 03/19/2020 0250   RDW 12.8 12/09/2018 1606   LYMPHSABS 4.1 (H) 12/09/2018 1606   MONOABS 1,161 (H) 08/07/2016 1403   EOSABS 0.1 12/09/2018 1606   BASOSABS 0.1 12/09/2018 1606    BMET    Component Value Date/Time   NA 134 (L) 03/18/2020 0420   NA 138 01/07/2020 1142   K 3.8 03/18/2020 0420   CL 102 03/18/2020 0420   CO2 23 03/18/2020 0420   GLUCOSE 112 (H) 03/18/2020 0420   BUN 12 03/18/2020 0420   BUN 11 01/07/2020 1142   CREATININE 1.12 03/18/2020 0420   CREATININE 1.42 (H) 12/20/2019 1421   CALCIUM 8.1 (L) 03/18/2020 0420   GFRNONAA >60 03/18/2020 0420   GFRNONAA 44 (L) 08/07/2016 1403   GFRAA 62 01/07/2020 1142   GFRAA 50 (L) 08/07/2016 1403     Intake/Output Summary (Last 24 hours) at 03/20/2020 0800 Last data filed at 03/20/2020 0315 Gross per 24 hour  Intake 480 ml  Output 815 ml  Net -335 ml    HOSPITAL  MEDICATIONS Scheduled Meds: . aspirin EC  81 mg Oral Daily  . Chlorhexidine Gluconate Cloth  6 each Topical Daily  . cholecalciferol  2,000 Units Oral Daily  . clopidogrel  75 mg Oral Daily  . docusate sodium  100 mg Oral Daily  . gabapentin  100 mg Oral Daily  . heparin  5,000 Units Subcutaneous Q8H  . lacosamide  150 mg Oral q AM  . lacosamide  200 mg Oral QHS  . pantoprazole  40 mg Oral Daily  . rosuvastatin  20 mg Oral Daily   Continuous Infusions: . magnesium sulfate bolus IVPB     PRN Meds:.acetaminophen **OR** acetaminophen, alum & mag hydroxide-simeth, bisacodyl, fentaNYL (SUBLIMAZE) injection, guaiFENesin-dextromethorphan, hydrALAZINE, HYDROcodone-acetaminophen, labetalol, magnesium sulfate bolus IVPB, metoprolol tartrate, ondansetron, phenol, polyethylene glycol  Assessment and Plan: -POD 4 right fem to BK pop bypass with PTFE for RLE ischemia with rest pain. RLE well perfused with moderate thigh edema. Check RLE venous duplex.  -Scrotal/penile swelling post-op with urinary hesitancy.  -Post-op hypotension: sys BP in 90-100 range  -Acute blood loss anemia: 11.3>9.7>9.1>8.6. No indication for transfusion. Check labs in am-  -DVT prophylaxis:  Heparin    SANDRA  Valetta Fuller, PA-C [facility] [phone] 03/20/2020  8:00 AM    I have independently interviewed and examined patient and agree with PA assessment and plan above.  Dizziness has improved.  Continues to have scrotal and penile swelling is able to urinate with standing.  Unfortunately has developed a DVT I have initiated heparin drip we will check labs in the a.m.  Sharnice Bosler C. Randie Heinz, MD [facility] [place] Office: [phone] Pager: [phone]

## 2020-03-20 NOTE — Progress Notes (Signed)
Occupational Therapy Treatment Patient Details Name: Jeffrey Serrano MRN: [id number] DOB: 08-20-1950 Today's Date: 03/20/2020    History of present illness 70 y.o. male with lower extremity claudication.  He has hypertension as a risk factor.  Patient takes Plavix now status post stent of right external iliac artery.  Underwent a BYPASS GRAFT RIGHT FEMORAL TO BELOW KNEE POPLITEAL ARTERY (Right) 12/29.  Was transferred to the ICU for R flank pain and hypotension after surgery.    OT comments  Pt progressing towards established OT goals. Pt performing functional mobility in hallway with Min Guard A and RW demonstrating increased activity tolerance. Providing education on compensatory techniques for LB dressing; pt verbalized understanding. Continue to recommend dc to home with HHOT and will continue to follow acutely as admitted.    Follow Up Recommendations  Home health OT    Equipment Recommendations  Tub/shower seat    Recommendations for Other Services      Precautions / Restrictions Precautions Precautions: Fall  Restrictions Weight Bearing Restrictions: No       Mobility Bed Mobility Overal bed mobility: Needs Assistance Bed Mobility: Supine to Sit Rolling: Supervision   Supine to sit: Min assist     General bed mobility comments: Min A to hold therapist's hand and pull into upright posture (Simultaneous filing. User may not have seen previous data.)  Transfers Overall transfer level: Needs assistance (Simultaneous filing. User may not have seen previous data.) Equipment used: Rolling walker (2 wheeled) (Simultaneous filing. User may not have seen previous data.) Transfers: Sit to/from [employer] (Simultaneous filing. User may not have seen previous data.) Sit to Stand: Min assist;Min guard (Simultaneous filing. User may not have seen previous data.) Stand pivot transfers: Min assist       General transfer comment: Min A for initial power up. Min  guard A for safety and balance (Simultaneous filing. User may not have seen previous data.)    Balance Overall balance assessment: Needs assistance (Simultaneous filing. User may not have seen previous data.) Sitting-balance support: No upper extremity supported (Simultaneous filing. User may not have seen previous data.) Sitting balance-Leahy Scale: Fair (Simultaneous filing. User may not have seen previous data.)     Standing balance support: Bilateral upper extremity supported;During functional activity (Simultaneous filing. User may not have seen previous data.) Standing balance-Leahy Scale: Poor (Simultaneous filing. User may not have seen previous data.) Standing balance comment: relies on RW for external support (Simultaneous filing. User may not have seen previous data.)                           ADL either performed or assessed with clinical judgement   ADL Overall ADL's : Needs assistance/impaired                     Lower Body Dressing: Minimal assistance;Sit to/from stand Lower Body Dressing Details (indicate cue type and reason): Pt able to adjust sock on LLE. Providing education on compensatory techniques for LB dressing and donning RLE into pants/underwear first. Toilet Transfer: Min guard;Minimal assistance;RW;Ambulation (simulated to recliner) Toilet Transfer Details (indicate cue type and reason): Min A for power up and then [employer] A for safety         Functional mobility during ADLs: Minimal assistance;Min guard;Rolling walker General ADL Comments: Demonstrating increased activity tolerance. Pt requiring Min Guard-Min A for ADLs and functional mobility     Vision  Perception     Praxis      Cognition Arousal/Alertness: Awake/alert (Simultaneous filing. User may not have seen previous data.) Behavior During Therapy: Flat affect (Simultaneous filing. User may not have seen previous data.) Overall Cognitive Status: Impaired/Different  from baseline (Simultaneous filing. User may not have seen previous data.) Area of Impairment: Problem solving                             Problem Solving: Slow processing;Requires verbal cues General Comments: Requiring increased time and cues        Exercises    Shoulder Instructions       General Comments VSS on RA. Recommend family bring underwear to decrease scrotal edema (Simultaneous filing. User may not have seen previous data.)    Pertinent Vitals/ Pain       Pain Assessment: Faces (Simultaneous filing. User may not have seen previous data.) Faces Pain Scale: Hurts a little bit (Simultaneous filing. User may not have seen previous data.) Pain Location: R leg.  Patient is very stoic, and denies pain as any thing that will bother him. (Simultaneous filing. User may not have seen previous data.) Pain Descriptors / Indicators: Tightness (Simultaneous filing. User may not have seen previous data.) Pain Intervention(s): Monitored during session;Limited activity within patient's tolerance;Repositioned (Simultaneous filing. User may not have seen previous data.)  Home Living                                          Prior Functioning/Environment              Frequency  Min 2X/week        Progress Toward Goals  OT Goals(current goals can now be found in the care plan section)  Progress towards OT goals: Progressing toward goals  Acute Rehab OT Goals Patient Stated Goal: to go home (Simultaneous filing. User may not have seen previous data.) OT Goal Formulation: With patient Time For Goal Achievement: 03/30/20 Potential to Achieve Goals: Good ADL Goals Pt Will Perform Grooming: with set-up;sitting;standing Pt Will Perform Lower Body Bathing: with set-up;sit to/from stand Pt Will Perform Lower Body Dressing: with set-up;sit to/from stand Pt Will Transfer to Toilet: with modified independence;regular height toilet;ambulating  Plan  Discharge plan remains appropriate    Co-evaluation                 AM-PAC OT "6 Clicks" Daily Activity     Outcome Measure   Help from another person eating meals?: None Help from another person taking care of personal grooming?: A Little Help from another person toileting, which includes using toliet, bedpan, or urinal?: A Little Help from another person bathing (including washing, rinsing, drying)?: A Lot Help from another person to put on and taking off regular upper body clothing?: A Little Help from another person to put on and taking off regular lower body clothing?: A Lot 6 Click Score: 17    End of Session Equipment Utilized During Treatment: Rolling walker;Gait belt  OT Visit Diagnosis: Unsteadiness on feet (R26.81);Muscle weakness (generalized) (M62.81);Pain Pain - part of body: Ankle and joints of foot   Activity Tolerance Patient tolerated treatment well   Patient Left in chair;with call bell/phone within reach;with chair alarm set   Nurse Communication Mobility status        Time: 2841-3244 OT Time Calculation (  min): 18 min  Charges: OT General Charges $OT Visit: 1 Visit OT Treatments $Therapeutic Activity: 8-22 mins  [place], OTR/L Acute Rehab Pager: [phone] Office: [place] 03/20/2020, 1:40 PM

## 2020-03-20 NOTE — Progress Notes (Signed)
Physical Therapy Treatment Patient Details Name: Jeffrey Serrano MRN: [id number] DOB: 1950/12/08 Today's Date: 03/20/2020    History of Present Illness 70 y.o. male with lower extremity claudication.  He has hypertension as a risk factor.  Patient takes Plavix now status post stent of right external iliac artery.  Underwent a BYPASS GRAFT RIGHT FEMORAL TO BELOW KNEE POPLITEAL ARTERY (Right) 12/29.  Was transferred to the ICU for R flank pain and hypotension after surgery.    PT Comments    Pt admitted with above diagnosis. Pt was able to ambulate with RW in hallway with continued progression. STill needs assist with power up and steadying initially but is steady overal with gait with RW.  Pt currently with functional limitations due to balance and endurance deficits. Pt will benefit from skilled PT to increase their independence and safety with mobility to allow discharge to the venue listed below.     Follow Up Recommendations  Home health PT;Supervision/Assistance - 24 hour     Equipment Recommendations  Rolling walker with 5" wheels;3in1 (PT)    Recommendations for Other Services       Precautions / Restrictions Precautions Precautions: Fall Restrictions Weight Bearing Restrictions: No    Mobility  Bed Mobility               General bed mobility comments: in chair on arrival  Transfers Overall transfer level: Needs assistance Equipment used: Rolling walker (2 wheeled) Transfers: Sit to/from [employer] Sit to Stand: Min assist Stand pivot transfers: Min assist       General transfer comment: Pt required min assist for power up and for steadying.  Ambulation/Gait Ambulation/Gait assistance: Min guard;Min assist Gait Distance (Feet): 210 Feet Assistive device: Rolling walker (2 wheeled) Gait Pattern/deviations: Step-to pattern;Decreased stance time - right;Decreased weight shift to right;Decreased step length - left;Drifts right/left;Wide base of  support;Antalgic   Gait velocity interpretation: <1.31 ft/sec, indicative of household ambulator General Gait Details: Pt progressed distance with ambulation and was able to get into hallway with RW and min guard assist most of walk once he initiated movement. Needed cues to stand tall as well as cues to slow down as pt tends to rush at times. Distance limited as [place] tech waiting for pt.   Stairs             Wheelchair Mobility    Modified Rankin (Stroke Patients Only)       Balance Overall balance assessment: Needs assistance Sitting-balance support: No upper extremity supported Sitting balance-Leahy Scale: Fair     Standing balance support: Bilateral upper extremity supported Standing balance-Leahy Scale: Poor Standing balance comment: relies on RW for external support                            Cognition Arousal/Alertness: Awake/alert Behavior During Therapy: Flat affect Overall Cognitive Status: Within Functional Limits for tasks assessed                                        Exercises General Exercises - Lower Extremity Ankle Circles/Pumps: Both;AROM;Seated;10 reps Long Arc Quad: AROM;Both;10 reps;Seated    General Comments General comments (skin integrity, edema, etc.): VSS      Pertinent Vitals/Pain Pain Assessment: Faces Faces Pain Scale: Hurts little more Pain Location: R leg.  Patient is very stoic Pain Descriptors / Indicators: Tightness Pain  Intervention(s): Limited activity within patient's tolerance;Monitored during session;Repositioned    Home Living                      Prior Function            PT Goals (current goals can now be found in the care plan section) Acute Rehab PT Goals Patient Stated Goal: to go home Progress towards PT goals: Progressing toward goals    Frequency    Min 3X/week      PT Plan Current plan remains appropriate    Co-evaluation              AM-PAC PT "6  Clicks" Mobility   Outcome Measure  Help needed turning from your back to your side while in a flat bed without using bedrails?: None Help needed moving from lying on your back to sitting on the side of a flat bed without using bedrails?: A Little Help needed moving to and from a bed to a chair (including a wheelchair)?: A Little Help needed standing up from a chair using your arms (e.g., wheelchair or bedside chair)?: A Little Help needed to walk in hospital room?: A Little Help needed climbing 3-5 steps with a railing? : A Lot 6 Click Score: 18    End of Session Equipment Utilized During Treatment: Gait belt Activity Tolerance: Patient limited by fatigue Patient left: in bed ([place] tech taking pt to test) Nurse Communication: Mobility status PT Visit Diagnosis: Unsteadiness on feet (R26.81);Muscle weakness (generalized) (M62.81);Dizziness and giddiness (R42)     Time: [id number] PT Time Calculation (min) (ACUTE ONLY): 14 min  Charges:  $Gait Training: 8-22 mins                     Dawn W,PT [place] Pager:  [phone]  Office:  [place] 03/20/2020, 1:35 PM

## 2020-03-21 LAB — HEPARIN LEVEL (UNFRACTIONATED)
Heparin Unfractionated: 1.2 IU/mL — ABNORMAL HIGH (ref 0.30–0.70)
Heparin Unfractionated: 0.98 IU/mL — ABNORMAL HIGH (ref 0.30–0.70)
Heparin Unfractionated: 1 IU/mL — ABNORMAL HIGH (ref 0.30–0.70)

## 2020-03-21 LAB — CBC
HCT: 27.6 % — ABNORMAL LOW (ref 39.0–52.0)
Hemoglobin: 9.6 g/dL — ABNORMAL LOW (ref 13.0–17.0)
MCH: 31.9 pg (ref 26.0–34.0)
MCHC: 34.8 g/dL (ref 30.0–36.0)
MCV: 91.7 fL (ref 80.0–100.0)
Platelets: 239 10*3/uL (ref 150–400)
RBC: 3.01 MIL/uL — ABNORMAL LOW (ref 4.22–5.81)
RDW: 14.2 % (ref 11.5–15.5)
WBC: 12.6 10*3/uL — ABNORMAL HIGH (ref 4.0–10.5)
nRBC: 0 % (ref 0.0–0.2)

## 2020-03-21 MED ORDER — HEPARIN (PORCINE) 25000 UT/250ML-% IV SOLN
600.0000 [IU]/h | INTRAVENOUS | Status: AC
  Administered 2020-03-22: 600 [IU]/h via INTRAVENOUS
  Filled 2020-03-21: qty 250

## 2020-03-21 MED ORDER — HEPARIN (PORCINE) 25000 UT/250ML-% IV SOLN
950.0000 [IU]/h | INTRAVENOUS | Status: DC
  Administered 2020-03-21: 950 [IU]/h via INTRAVENOUS
  Filled 2020-03-21: qty 250

## 2020-03-21 NOTE — Progress Notes (Signed)
Mobility Specialist - Progress Note   03/21/20 1241  Mobility  Activity Ambulated in hall  Level of Assistance Standby assist, set-up cues, supervision of patient - no hands on  Assistive Device Front wheel walker  Distance Ambulated (ft) 410 ft (Intervals: 205 ft x 2)  Mobility Response Tolerated well  Mobility performed by Mobility specialist  $Mobility charge 1 Mobility   Pre-mobility: 82 HR During mobility: 104 HR Post-mobility: 95 HR  Pt required one standing rest break due to RLE soreness. Otherwise asx. Pt back in recliner after walk.   Mamie Levers Mobility Specialist Mobility Specialist Phone: [phone]

## 2020-03-21 NOTE — Progress Notes (Signed)
ANTICOAGULATION CONSULT NOTE Pharmacy Consult for heparin Indication: DVT  No Known Allergies  Patient Measurements: Height: 5\' 8"  (172.7 cm) Weight: 75.8 kg (167 lb 1.7 oz) IBW/kg (Calculated) : 68.4 Heparin Dosing Weight: 76kg  Vital Signs: Temp: 98.3 F (36.8 C) (01/03 2351) Temp Source: Oral (01/03 2351) BP: 110/70 (01/03 2351) Pulse Rate: 91 (01/03 2351)  Labs: Recent Labs    03/18/20 0420 03/19/20 0250 03/21/20 0052  HGB 9.1* 8.6* 9.6*  HCT 27.7* 25.8* 27.6*  PLT 121* 142* 239  HEPARINUNFRC  --   --  1.20*  CREATININE 1.12  --   --     Estimated Creatinine Clearance: 59.4 mL/min (by C-G formula based on SCr of 1.12 mg/dL).   Assessment: 70 y.o. male with new DVT s/p fem-pop bypass 12/29, for heparin  Goal of Therapy:  Heparin level 0.3-0.7 units/ml Monitor platelets by anticoagulation protocol: Yes   Plan:  Hold  Heparin x 1 hour, then decrease heparin 950 units/hr Check heparin level in 8 hours.   1/30, PharmD, BCPS   03/21/2020 2:42 AM

## 2020-03-21 NOTE — TOC Benefit Eligibility Note (Signed)
Transition of Care [facility]) Benefit Eligibility Note    Patient Details  Name: ABANOUB HANKEN MRN: [id number] Date of Birth: [birth year]   Medication/Dose: Carlena Hurl 20 MG DAILY  Covered?: Yes  Tier: 3 Drug  Prescription Coverage [facility]  Spoke with Person/Company/Phone Number:: CHRIS  @  [employer] # [phone]  Co-Pay: $45.00  Prior Approval: No  Deductible:  (NO DEDUCTIBLE)  Additional Notes: ELIQUIS  5 MG BID  COVER- YES  CO-PAY- $45.00  TIER- 3 DRUG  P/A- NO    Mardene Sayer Phone Number: 03/21/2020, 11:08 AM

## 2020-03-21 NOTE — Progress Notes (Signed)
ANTICOAGULATION CONSULT NOTE Pharmacy Consult for heparin Indication: DVT  No Known Allergies  Patient Measurements: Height: 5\' 8"  (172.7 cm) Weight: 75.8 kg (167 lb 1.7 oz) IBW/kg (Calculated) : 68.4 Heparin Dosing Weight: 76kg  Vital Signs: Temp: 98.1 F (36.7 C) (01/04 1135) Temp Source: Oral (01/04 1135) BP: 113/75 (01/04 1135) Pulse Rate: 86 (01/04 1135)  Labs: Recent Labs    03/19/20 0250 03/21/20 0052 03/21/20 0720 03/21/20 1212  HGB 8.6* 9.6*  --   --   HCT 25.8* 27.6*  --   --   PLT 142* 239  --   --   HEPARINUNFRC  --  1.20* 0.98* 1.00*    Estimated Creatinine Clearance: 59.4 mL/min (by C-G formula based on SCr of 1.12 mg/dL).   Assessment: 70 y.o. male with new DVT s/p fem-pop bypass 12/29, for heparin.  Heparin level remains elevated despite turning down drip rate this AM.  No overt bleeding or complications noted.  Goal of Therapy:  Heparin level 0.3-0.7 units/ml Monitor platelets by anticoagulation protocol: Yes   Plan:  Hold  Heparin x 1 hour, then decrease heparin 750 units/hr Check heparin level in 8 hours.  F/u transition to oral anticoagulation soon.  1/30, Reece Leader, BCCP Clinical Pharmacist  03/21/2020 2:06 PM   [facility] pharmacy phone numbers are listed on [email or web address]

## 2020-03-21 NOTE — Progress Notes (Addendum)
  Progress Note    03/21/2020 7:30 AM 6 Days Post-Op  Subjective:  Says he had a bowel movement-feels better.  Also says his incisions are draining.    Afebrile HR 70's-80's NSR 110's systolic 100% RA  Vitals:   03/21/20 0407 03/21/20 0408  BP: 111/71   Pulse: 82   Resp: (!) 26 20  Temp: 98.2 F (36.8 C)   SpO2: 100%     Physical Exam: Cardiac:  regular Lungs:  Non labored Incisions:  Right groin is clean and dry; proximal saphenous vein harvest with serosanguioius drainage increased from other incisions that are scant on bandages.  Extremities:  Brisk right AT/peroneal/PT doppler signals.   CBC    Component Value Date/Time   WBC 12.6 (H) 03/21/2020 0052   RBC 3.01 (L) 03/21/2020 0052   HGB 9.6 (L) 03/21/2020 0052   HGB 17.0 12/09/2018 1606   HCT 27.6 (L) 03/21/2020 0052   HCT 47.8 12/09/2018 1606   PLT 239 03/21/2020 0052   PLT 243 12/09/2018 1606   MCV 91.7 03/21/2020 0052   MCV 94 12/09/2018 1606   MCH 31.9 03/21/2020 0052   MCHC 34.8 03/21/2020 0052   RDW 14.2 03/21/2020 0052   RDW 12.8 12/09/2018 1606   LYMPHSABS 4.1 (H) 12/09/2018 1606   MONOABS 1,161 (H) 08/07/2016 1403   EOSABS 0.1 12/09/2018 1606   BASOSABS 0.1 12/09/2018 1606    BMET    Component Value Date/Time   NA 134 (L) 03/18/2020 0420   NA 138 01/07/2020 1142   K 3.8 03/18/2020 0420   CL 102 03/18/2020 0420   CO2 23 03/18/2020 0420   GLUCOSE 112 (H) 03/18/2020 0420   BUN 12 03/18/2020 0420   BUN 11 01/07/2020 1142   CREATININE 1.12 03/18/2020 0420   CREATININE 1.42 (H) 12/20/2019 1421   CALCIUM 8.1 (L) 03/18/2020 0420   GFRNONAA >60 03/18/2020 0420   GFRNONAA 44 (L) 08/07/2016 1403   GFRAA 62 01/07/2020 1142   GFRAA 50 (L) 08/07/2016 1403    INR    Component Value Date/Time   INR 1.1 03/08/2020 1138     Intake/Output Summary (Last 24 hours) at 03/21/2020 0730 Last data filed at 03/21/2020 0656 Gross per 24 hour  Intake 114.78 ml  Output 1125 ml  Net -1010.22 ml      Assessment:  70 y.o. male is s/p:  right fem to BK pop bypass with PTFE for RLE ischemia with rest pain  6 Days Post-Op  Plan: -pt with brisk doppler signals right DP/PT/peroneal -pt with DVT right femoral vein-now on heparin gtt.  Will need oral AC prior to discharge.  Will ask TOC to price meds -BP improved to 110's systolic -acute blood loss anemia-improved from a couple of days ago. -mobilize   Doreatha Massed, [place] [facility] [phone] 03/21/2020 7:30 AM  I have independently interviewed and examined patient and agree with PA assessment and plan above.  Will need transition to oral anticoagulation.  Plan will be home with home health PT.  Rhyland Hinderliter C. Randie Heinz, MD [facility] [place] Office: [phone] Pager: [phone]

## 2020-03-22 LAB — CBC
HCT: 27 % — ABNORMAL LOW (ref 39.0–52.0)
Hemoglobin: 8.6 g/dL — ABNORMAL LOW (ref 13.0–17.0)
MCH: 30.5 pg (ref 26.0–34.0)
MCHC: 31.9 g/dL (ref 30.0–36.0)
MCV: 95.7 fL (ref 80.0–100.0)
Platelets: 312 10*3/uL (ref 150–400)
RBC: 2.82 MIL/uL — ABNORMAL LOW (ref 4.22–5.81)
RDW: 14.5 % (ref 11.5–15.5)
WBC: 15.1 10*3/uL — ABNORMAL HIGH (ref 4.0–10.5)
nRBC: 0 % (ref 0.0–0.2)

## 2020-03-22 LAB — HEPARIN LEVEL (UNFRACTIONATED)
Heparin Unfractionated: 0.38 IU/mL (ref 0.30–0.70)
Heparin Unfractionated: 0.78 [IU]/mL — ABNORMAL HIGH (ref 0.30–0.70)

## 2020-03-22 MED ORDER — RIVAROXABAN 15 MG PO TABS
15.0000 mg | ORAL_TABLET | Freq: Two times a day (BID) | ORAL | Status: DC
  Administered 2020-03-22 – 2020-03-27 (×10): 15 mg via ORAL
  Filled 2020-03-22 (×10): qty 1

## 2020-03-22 MED ORDER — RIVAROXABAN 20 MG PO TABS: 20.0000 mg | ORAL_TABLET | Freq: Every day | ORAL | Status: DC

## 2020-03-22 NOTE — Progress Notes (Signed)
Mobility Specialist - Progress Note   03/22/20 1148  Mobility  Activity Ambulated in hall  Level of Assistance Contact guard assist, steadying assist  Assistive Device Front wheel walker  Distance Ambulated (ft) 110 ft (Intervals: 55 ft x 2)  Mobility Response Tolerated well  Mobility performed by Mobility specialist  $Mobility charge 1 Mobility   Pre-mobility: 92 HR During mobility: 112 HR Post-mobility: 96 HR  Distance limited by RLE pain; he stated he couldn't rate the pain, but said it didn't worsen w/ ambulation. Pt back in recliner after walk.   Jeffrey Serrano Mobility Specialist Mobility Specialist Phone: [phone]

## 2020-03-22 NOTE — Progress Notes (Addendum)
Physical Therapy Treatment Patient Details Name: Jeffrey Serrano MRN: [id number] DOB: 05-Apr-1950 Today's Date: 03/22/2020    History of Present Illness 70 y.o. male with lower extremity claudication.  He has hypertension as a risk factor.  Patient takes Plavix now status post stent of right external iliac artery.  Underwent a BYPASS GRAFT RIGHT FEMORAL TO BELOW KNEE POPLITEAL ARTERY (Right) 12/29.  Was transferred to the ICU for R flank pain and hypotension after surgery.    PT Comments    Pt seated in recliner.  Focused on LE strengthening and gt progression this pm.  Pt continues to lack step length on L side and encouraged weight bearing on R for improved gt symmetry.  Continue to recommend HHPT at d/c.  Plan for stair training next session.    Follow Up Recommendations  Home health PT;Supervision/Assistance - 24 hour     Equipment Recommendations  Rolling walker with 5" wheels;3in1 (PT)    Recommendations for Other Services       Precautions / Restrictions Precautions Precautions: Fall Restrictions Weight Bearing Restrictions: No    Mobility  Bed Mobility               General bed mobility comments: Pt seated in recliner on arrival.  Transfers Overall transfer level: Needs assistance Equipment used: Rolling walker (2 wheeled) Transfers: Sit to/from Stand Sit to Stand: Min guard         General transfer comment: Cues for hand placement and forward weight shifting to rise into standing.  Ambulation/Gait Ambulation/Gait assistance: Min guard Gait Distance (Feet): 85 Feet Assistive device: Rolling walker (2 wheeled) Gait Pattern/deviations: Decreased stance time - right;Decreased weight shift to right;Decreased step length - left;Drifts right/left;Wide base of support;Antalgic;Step-through pattern;Step-to pattern     General Gait Details: Cues for progression to step through pattern.  Shortened stride length noted on L side.  Cues for sequencing and gt symmetry.      Stairs             Wheelchair Mobility    Modified Rankin (Stroke Patients Only)       Balance Overall balance assessment: Needs assistance   Sitting balance-Leahy Scale: Good       Standing balance-Leahy Scale: Fair                              Cognition Arousal/Alertness: Awake/alert Behavior During Therapy: Flat affect Overall Cognitive Status: Within Functional Limits for tasks assessed                                 General Comments: Pt able to follow commands during session, cognition not formally assessed but appears with in normal limits.      Exercises Total Joint Exercises Ankle Circles/Pumps: AROM;Both;10 reps;Supine Quad Sets: AROM;Both;10 reps;Supine Heel Slides: AROM;Both;10 reps;AAROM;Supine Hip ABduction/ADduction: AROM;AAROM;Both;10 reps;Supine Straight Leg Raises: AROM;AAROM;Both;10 reps;Supine    General Comments        Pertinent Vitals/Pain Pain Assessment: Faces Faces Pain Scale: Hurts little more Pain Location: R leg.  Patient is very stoic, and denies pain as any thing that will bother him. Pain Descriptors / Indicators: Tightness Pain Intervention(s): Monitored during session;Repositioned    Home Living                      Prior Function  PT Goals (current goals can now be found in the care plan section) Acute Rehab PT Goals Patient Stated Goal: to go home Potential to Achieve Goals: Good Progress towards PT goals: Progressing toward goals    Frequency    Min 3X/week      PT Plan Current plan remains appropriate    Co-evaluation              AM-PAC PT "6 Clicks" Mobility   Outcome Measure  Help needed turning from your back to your side while in a flat bed without using bedrails?: None Help needed moving from lying on your back to sitting on the side of a flat bed without using bedrails?: A Little Help needed moving to and from a bed to a chair  (including a wheelchair)?: A Little Help needed standing up from a chair using your arms (e.g., wheelchair or bedside chair)?: A Little Help needed to walk in hospital room?: A Little Help needed climbing 3-5 steps with a railing? : A Little 6 Click Score: 19    End of Session Equipment Utilized During Treatment: Gait belt Activity Tolerance: Patient limited by fatigue Patient left: in bed;with call bell/phone within reach;with bed alarm set Nurse Communication: Mobility status PT Visit Diagnosis: Unsteadiness on feet (R26.81);Muscle weakness (generalized) (M62.81);Dizziness and giddiness (R42)     Time: [id number] PT Time Calculation (min) (ACUTE ONLY): 25 min  Charges:  $Gait Training: 8-22 mins $Therapeutic Exercise: 8-22 mins                     Erasmo Leventhal , PTA Acute Rehabilitation Services Pager [phone] Office [phone]     Shaianne Nucci Eli Hose 03/22/2020, 3:43 PM

## 2020-03-22 NOTE — Progress Notes (Signed)
ANTICOAGULATION CONSULT NOTE - Follow Up Consult  Pharmacy Consult for Heparin Indication: DVT, RLE  No Known Allergies  Patient Measurements: Height: 5\' 8"  (172.7 cm) Weight: 75.8 kg (167 lb 1.7 oz) IBW/kg (Calculated) : 68.4 Heparin Dosing Weight: 75,8 kg  Vital Signs: Temp: 98.6 F (37 C) (01/05 0756) Temp Source: Oral (01/05 0756) BP: 115/69 (01/05 0756) Pulse Rate: 92 (01/05 0435)  Labs: Recent Labs    03/21/20 0052 03/21/20 0720 03/21/20 1212 03/21/20 2334 03/22/20 1038  HGB 9.6*  --   --   --  8.6*  HCT 27.6*  --   --   --  27.0*  PLT 239  --   --   --  312  HEPARINUNFRC 1.20*   < > 1.00* 0.78* 0.38   < > = values in this interval not displayed.    Estimated Creatinine Clearance: 59.4 mL/min (by C-G formula based on SCr of 1.12 mg/dL).  Assessment:  70 yr old male with new RLE DVT per duplex 03/20/20.  S/p R fem-pop bypass 03/15/20.  Had been on SQ heparin for VTE prophylaxis post-op > changed to IV heparin on 03/20/20.      Heparin levels have been elevated and infusion rate reduced x 3.   Heparin level is now therapeutic (0.38) on 600 units/hr.  Hgb trended down some, no bleeding reported. Platelet count previously low but now normal.       Also on ASA 81 mg and Plavix 75 mg daily as prior to admission.    Hx R external iliac artery DES on 01/24/20.  Goal of Therapy:  Heparin level 0.3-0.7 units/ml Monitor platelets by anticoagulation protocol: Yes   Plan:   Continue heparin drip at 600 units/hr  Daily heparin level and CBC while on heparin.  Monitor for bleeding.  Follow up for transition to oral anticoagulation.  13/8/21, RPh 03/22/2020,11:19 AM

## 2020-03-22 NOTE — Discharge Instructions (Signed)
[facility]  Discharge instructions  Lower Extremity Bypass Surgery  Please refer to the following instruction for your post-procedure care. Your surgeon or physician assistant will discuss any changes with you.  Activity  You are encouraged to walk as much as you can. You can slowly return to normal activities during the month after your surgery. Avoid strenuous activity and heavy lifting until your doctor tells you it's OK. Avoid activities such as vacuuming or swinging a golf club. Do not drive until your doctor give the OK and you are no longer taking prescription pain medications. It is also normal to have difficulty with sleep habits, eating and bowel movement after surgery. These will go away with time.  Bathing/Showering  Shower daily after you go home. Do not soak in a bathtub, hot tub, or swim until the incision heals completely.  Incision Care  Clean your incision with mild soap and water. Shower every day. Pat the area dry with a clean towel. You do not need a bandage unless otherwise instructed. Do not apply any ointments or creams to your incision. If you have open wounds you will be instructed how to care for them or a visiting nurse may be arranged for you. If you have staples or sutures along your incision they will be removed at your post-op appointment. You may have skin glue on your incision. Do not peel it off. It will come off on its own in about one week.  Wash the groin wound with soap and water daily and pat dry. (No tub bath-only shower)  Then put a dry gauze or washcloth in the groin to keep this area dry to help prevent wound infection.  Do this daily and as needed.  Do not use Vaseline or neosporin on your incisions.  Only use soap and water on your incisions and then protect and keep dry.  Diet  Resume your normal diet. There are no special food restrictions following this procedure. A low fat/ low cholesterol diet is  recommended for all patients with vascular disease. In order to heal from your surgery, it is CRITICAL to get adequate nutrition. Your body requires vitamins, minerals, and protein. Vegetables are the best source of vitamins and minerals. Vegetables also provide the perfect balance of protein. Processed food has little nutritional value, so try to avoid this.  Medications  Resume taking all your medications unless your doctor or physician assistant tells you not to. If your incision is causing pain, you may take over-the-counter pain relievers such as acetaminophen (Tylenol). If you were prescribed a stronger pain medication, please aware these medication can cause nausea and constipation. Prevent nausea by taking the medication with a snack or meal. Avoid constipation by drinking plenty of fluids and eating foods with high amount of fiber, such as fruits, vegetables, and grains. Take Colace 100 mg (an over-the-counter stool softener) twice a day as needed for constipation.  Do not take Tylenol if you are taking prescription pain medications.  Follow Up  Our office will schedule a follow up appointment 2-3 weeks following discharge.  Please call us immediately for any of the following conditions  .Severe or worsening pain in your legs or feet while at rest or while walking .Increase pain, redness, warmth, or drainage (pus) from your incision site(s) . Fever of 101 degree or higher . The swelling in your leg with the bypass suddenly worsens and becomes more painful than when you were in the hospital .  If you have been instructed to feel your graft pulse then you should do so every day. If you can no longer feel this pulse, call the office immediately. Not all patients are given this instruction. .  Leg swelling is common after leg bypass surgery.  The swelling should improve over a few months following surgery. To improve the swelling, you may elevate your legs above the level of your heart while  you are sitting or resting. Your surgeon or physician assistant may ask you to apply an ACE wrap or wear compression (TED) stockings to help to reduce swelling.  Reduce your risk of vascular disease  Stop smoking. If you would like help call [facility] at 1-800-QUIT-NOW (501-644-4919) or [place] at 805-834-0009.  . Manage your cholesterol . Maintain a desired weight . Control your diabetes weight . Control your diabetes . Keep your blood pressure down .  If you have any questions, please call the office at [phone]   Information on my medicine - XARELTO (rivaroxaban)  WHY WAS XARELTO PRESCRIBED FOR YOU? Xarelto was prescribed to treat blood clots that may have been found in the veins of your legs (deep vein thrombosis) or in your lungs (pulmonary embolism) and to reduce the risk of them occurring again.  What do you need to know about Xarelto? The starting dose is one 15 mg tablet taken TWICE daily with food for the FIRST 21 DAYS then on 04/13/20  the dose is changed to one 20 mg tablet taken ONCE A DAY with your evening meal.  DO NOT stop taking Xarelto without talking to the health care provider who prescribed the medication.  Refill your prescription for 20 mg tablets before you run out.  After discharge, you should have regular check-up appointments with your healthcare provider that is prescribing your Xarelto.  In the future your dose may need to be changed if your kidney function changes by a significant amount.  What do you do if you miss a dose? If you are taking Xarelto TWICE DAILY and you miss a dose, take it as soon as you remember. You may take two 15 mg tablets (total 30 mg) at the same time then resume your regularly scheduled 15 mg twice daily the next day.  If you are taking Xarelto ONCE DAILY and you miss a dose, take it as soon as you remember on the same day then continue your regularly scheduled once daily regimen the next day. Do not take two doses  of Xarelto at the same time.   Important Safety Information Xarelto is a blood thinner medicine that can cause bleeding. You should call your healthcare provider right away if you experience any of the following: ? Bleeding from an injury or your nose that does not stop. ? Unusual colored urine (red or dark brown) or unusual colored stools (red or black). ? Unusual bruising for unknown reasons. ? A serious fall or if you hit your head (even if there is no bleeding).  Some medicines may interact with Xarelto and might increase your risk of bleeding while on Xarelto. To help avoid this, consult your healthcare provider or pharmacist prior to using any new prescription or non-prescription medications, including herbals, vitamins, non-steroidal anti-inflammatory drugs (NSAIDs) and supplements.  This website has more information on Xarelto: VisitDestination.com.br.

## 2020-03-22 NOTE — Progress Notes (Signed)
ANTICOAGULATION CONSULT NOTE - Follow Up Consult  Pharmacy Consult for Xarelto Indication: DVT, RLE  No Known Allergies  Patient Measurements: Height: 5\' 8"  (172.7 cm) Weight: 75.8 kg (167 lb 1.7 oz) IBW/kg (Calculated) : 68.4  Labs: Recent Labs    03/21/20 0052 03/21/20 0720 03/21/20 1212 03/21/20 2334 03/22/20 1038  HGB 9.6*  --   --   --  8.6*  HCT 27.6*  --   --   --  27.0*  PLT 239  --   --   --  312  HEPARINUNFRC 1.20*   < > 1.00* 0.78* 0.38   < > = values in this interval not displayed.    Estimated Creatinine Clearance: 59.4 mL/min (by C-G formula based on SCr of 1.12 mg/dL).  Assessment: 70 yr old male with new RLE DVT per duplex 03/20/20.  S/p R fem-pop bypass 03/15/20.  Had been on SQ heparin for VTE prophylaxis post-op > changed to IV heparin on 03/20/20; consulted to change to Xarelto for upcoming discharge.  Also on ASA 81 mg and Plavix 75 mg daily as prior to admission. Hx R external iliac artery DES on 01/24/20. Hgb trended down some, no bleeding reported. Platelet count previously low but now normal.  Goal of Therapy:  full dose anticoagulation Monitor platelets by anticoagulation protocol: Yes   Plan:  Discontinue heparin drip at 16:59 Xarelto 15 mg PO bid with meals for 21 days then 20 mg PO daily with supper Educate prior to discharge Pharmacy signing off but will continue to follow peripherally - please re-consult if needed   Thank you for involving pharmacy in this patient's care.  13/8/21, PharmD, BCPS Clinical Pharmacist Clinical phone for 03/22/2020 until 3p is [phone] 03/22/2020 2:47 PM  **Pharmacist phone directory can be found on [email or web address] listed under [facility]**

## 2020-03-22 NOTE — TOC Initial Note (Signed)
Transition of Care (TOC) - Initial/Assessment Note  Donn Pierini RN, BSN Transitions of Care Unit 4E- RN Case Manager See Treatment Team for direct phone #    Patient Details  Name: Jeffrey Serrano MRN: [id number] Date of Birth: 1950-05-21  Transition of Care [facility]) CM/SW Contact:    Darrold Span, RN Phone Number: 03/22/2020, 11:27 AM  Clinical Narrative:                 Pt s/p fempop, +DVT- from home with family. Referral for benefits check on doacs- per check copay cost for either is $45.  Noted orders for [facility] and DME needs- CM spoke with pt at bedside- discussed HH and DME recs- pt agreeable to [facility]- list provided for choice Per CMS guidelines from medicare.gov website with star ratings (copy placed in shadow chart)- pt voiced he does not have a preference and defers to [facility] to secure an agency on his behalf. Pt voiced that he does not want a shower chair or 3n1 however does want a RW for home.  Call made to [employer] line for DME referral for RW- RW to be delivered to room prior to discharge.  Call made to [place] with [facility] for [facility] referral- referral has been accepted for HHRN/PT/OT needs.   Expected Discharge Plan: Home w Home Health Services Barriers to Discharge: Continued Medical Work up   Patient Goals and CMS Choice Patient states their goals for this hospitalization and ongoing recovery are:: return home CMS Medicare.gov Compare Post Acute Care list provided to:: Patient Choice offered to / list presented to : Patient  Expected Discharge Plan and Services Expected Discharge Plan: Home w Home Health Services   Discharge Planning Services: CM Consult Post Acute Care Choice: Home Health,Durable Medical Equipment Living arrangements for the past 2 months: Single Family Home                 DME Arranged: Walker rolling DME Agency: [employer] Date DME Agency Contacted: 03/22/20 Time DME Agency Contacted: 1126 Representative spoke with at DME Agency: Velna Hatchet HH Arranged:  RN,PT,OT HH Agency: [facility] Date HH Agency Contacted: 03/22/20 Time HH Agency Contacted: 1030 Representative spoke with at [facility] Agency: [place]  Prior Living Arrangements/Services Living arrangements for the past 2 months: Single Family Home Lives with:: Self,Relatives Patient language and need for interpreter reviewed:: Yes Do you feel safe going back to the place where you live?: Yes      Need for Family Participation in Patient Care: Yes (Comment) Care giver support system in place?: Yes (comment) Current home services: DME Criminal Activity/Legal Involvement Pertinent to Current Situation/Hospitalization: No - Comment as needed  Activities of Daily Living Home Assistive Devices/Equipment: None ADL Screening (condition at time of admission) Patient's cognitive ability adequate to safely complete daily activities?: Yes Is the patient deaf or have difficulty hearing?: No Does the patient have difficulty seeing, even when wearing glasses/contacts?: No Does the patient have difficulty concentrating, remembering, or making decisions?: Yes Patient able to express need for assistance with ADLs?: Yes Does the patient have difficulty dressing or bathing?: No Independently performs ADLs?: Yes (appropriate for developmental age) Does the patient have difficulty walking or climbing stairs?: No Weakness of Legs: None Weakness of Arms/Hands: None  Permission Sought/Granted Permission sought to share information with : Facility Industrial/product designer granted to share information with : Yes, Verbal Permission Granted     Permission granted to share info w AGENCY: HH/DME  Emotional Assessment Appearance:: Appears stated age Attitude/Demeanor/Rapport: Engaged Affect (typically observed): Appropriate Orientation: : Oriented to Self,Oriented to Place,Oriented to  Time,Oriented to Situation Alcohol / Substance Use: Not Applicable Psych Involvement: No  (comment)  Admission diagnosis:  PAD (peripheral artery disease) (HCC) [I73.9] Patient Active Problem List   Diagnosis Date Noted  . Preoperative cardiovascular examination 02/14/2020  . PAD (peripheral artery disease) (HCC) 01/24/2020  . Chronic hepatitis C without hepatic coma (HCC) 08/07/2016  . Seizure disorder (HCC) 05/22/2016  . DEPRESSION 09/10/2006  . Essential hypertension 09/10/2006  . PERIPHERAL VASCULAR DISEASE 09/10/2006  . Seizures (HCC) 09/10/2006   PCP:  Shade Flood, MD Pharmacy:   [facility] [phone] [place], [place] - [address] AT [facility] [address] [place] [address] Phone: [phone] Fax: [phone]  [facility] - [place], [place] - [address] [place] [address] [place] [address] Phone: [phone] Fax: [phone]  [id number] [place], [place] - [address] AT [facility] OF [facility] & [address] [place] [address] Phone: [phone] Fax: [phone]  [facility] - [place], [place] - [address] [place] [address] [place] [address] Phone: [phone] Fax: [phone]     Social Determinants of Health (SDOH) Interventions    Readmission Risk Interventions Readmission Risk Prevention Plan 03/22/2020  Post Dischage Appt Complete  Medication Screening Complete  Transportation Screening Complete  Some recent data might be hidden

## 2020-03-22 NOTE — Progress Notes (Addendum)
  Progress Note    03/22/2020 7:55 AM 7 Days Post-Op  Subjective:  Says his dressings were just changed at 6am.  Still having some pain.  Walked in the hallways yesterday  Afebrile HR 80's-110's NSR 100's-120's systolic 97% RA  Vitals:   03/22/20 0213 03/22/20 0435  BP: 118/71 102/72  Pulse: 85 92  Resp: 20 20  Temp: 98.3 F (36.8 C) 98.8 F (37.1 C)  SpO2: 90% 97%    Physical Exam: Cardiac:  regular Lungs:  Non labored Incisions:  All dressings in place and dry.  Right groin is clean. Extremities:  Brisk doppler signals right DP/PT/peroneal.   CBC    Component Value Date/Time   WBC 12.6 (H) 03/21/2020 0052   RBC 3.01 (L) 03/21/2020 0052   HGB 9.6 (L) 03/21/2020 0052   HGB 17.0 12/09/2018 1606   HCT 27.6 (L) 03/21/2020 0052   HCT 47.8 12/09/2018 1606   PLT 239 03/21/2020 0052   PLT 243 12/09/2018 1606   MCV 91.7 03/21/2020 0052   MCV 94 12/09/2018 1606   MCH 31.9 03/21/2020 0052   MCHC 34.8 03/21/2020 0052   RDW 14.2 03/21/2020 0052   RDW 12.8 12/09/2018 1606   LYMPHSABS 4.1 (H) 12/09/2018 1606   MONOABS 1,161 (H) 08/07/2016 1403   EOSABS 0.1 12/09/2018 1606   BASOSABS 0.1 12/09/2018 1606    BMET    Component Value Date/Time   NA 134 (L) 03/18/2020 0420   NA 138 01/07/2020 1142   K 3.8 03/18/2020 0420   CL 102 03/18/2020 0420   CO2 23 03/18/2020 0420   GLUCOSE 112 (H) 03/18/2020 0420   BUN 12 03/18/2020 0420   BUN 11 01/07/2020 1142   CREATININE 1.12 03/18/2020 0420   CREATININE 1.42 (H) 12/20/2019 1421   CALCIUM 8.1 (L) 03/18/2020 0420   GFRNONAA >60 03/18/2020 0420   GFRNONAA 44 (L) 08/07/2016 1403   GFRAA 62 01/07/2020 1142   GFRAA 50 (L) 08/07/2016 1403    INR    Component Value Date/Time   INR 1.1 03/08/2020 1138     Intake/Output Summary (Last 24 hours) at 03/22/2020 0755 Last data filed at 03/22/2020 0439 Gross per 24 hour  Intake 327.37 ml  Output 400 ml  Net -72.63 ml     Assessment:  70 y.o. male is s/p:  right fem to BK  pop bypass with PTFE for RLE ischemia with rest pain  7 Days Post-Op  Plan: -pt with brisk DP/PT/peroneal doppler signals.  -pt with DVT right femoral vein on heparin gtt.  Xarelto and Eliquis co-pay $45.  Discussed elevating his leg to help with swelling. -walked in hallways yesterday. -face to face placed for [facility] & DME orders placed.   Doreatha Massed, PA-C [facility] [phone] 03/22/2020 7:55 AM   I have independently interviewed and examined patient and agree with PA assessment and plan above.  We will transition to oral anticoagulation and plan for discharge with home health.  Regenia Erck C. Randie Heinz, MD [facility] [place] Office: [phone] Pager: [phone]

## 2020-03-22 NOTE — Progress Notes (Signed)
ANTICOAGULATION CONSULT NOTE Pharmacy Consult for Heparin Indication: DVT  No Known Allergies  Patient Measurements: Height: 5\' 8"  (172.7 cm) Weight: 75.8 kg (167 lb 1.7 oz) IBW/kg (Calculated) : 68.4 Heparin Dosing Weight: 76kg  Vital Signs: Temp: 98.3 F (36.8 C) (01/04 1939) Temp Source: Oral (01/04 1939) BP: 126/66 (01/04 1939) Pulse Rate: 84 (01/04 1939)  Labs: Recent Labs    03/19/20 0250 03/21/20 0052 03/21/20 0052 03/21/20 0720 03/21/20 1212 03/21/20 2334  HGB 8.6* 9.6*  --   --   --   --   HCT 25.8* 27.6*  --   --   --   --   PLT 142* 239  --   --   --   --   HEPARINUNFRC  --  1.20*   < > 0.98* 1.00* 0.78*   < > = values in this interval not displayed.    Estimated Creatinine Clearance: 59.4 mL/min (by C-G formula based on SCr of 1.12 mg/dL).   Assessment: 70 y.o. male with new DVT s/p fem-pop bypass 12/29, for heparin.  Heparin level remains elevated despite turning down drip rate this AM.  No overt bleeding or complications noted.  1/5 AM update:  Heparin level remains elevated No issues per RN  Goal of Therapy:  Heparin level 0.3-0.7 units/ml Monitor platelets by anticoagulation protocol: Yes   Plan:  Dec heparin to 600 units/hr Re-check heparin level in 8 hours F/u transition to oral anticoagulation soon.  1/30, PharmD, BCPS Clinical Pharmacist Phone: [phone]

## 2020-03-23 LAB — CBC
HCT: 23.6 % — ABNORMAL LOW (ref 39.0–52.0)
Hemoglobin: 7.9 g/dL — ABNORMAL LOW (ref 13.0–17.0)
MCH: 31.3 pg (ref 26.0–34.0)
MCHC: 33.5 g/dL (ref 30.0–36.0)
MCV: 93.7 fL (ref 80.0–100.0)
Platelets: 319 10*3/uL (ref 150–400)
RBC: 2.52 MIL/uL — ABNORMAL LOW (ref 4.22–5.81)
RDW: 15 % (ref 11.5–15.5)
WBC: 15.6 10*3/uL — ABNORMAL HIGH (ref 4.0–10.5)
nRBC: 0 % (ref 0.0–0.2)

## 2020-03-23 NOTE — Progress Notes (Signed)
PT Cancellation Note  Patient Details Name: Jeffrey Serrano MRN: [id number] DOB: 01/09/1951   Cancelled Treatment:    Reason Eval/Treat Not Completed: (P) Medical issues which prohibited therapy (Per PA Wendi Maya, hold therapies today secondary to bleeding at femoral site.)   Florestine Avers 03/23/2020, 1:16 PM  Bonney Leitz , PTA Acute Rehabilitation Services Pager [phone] Office [phone]

## 2020-03-23 NOTE — Progress Notes (Signed)
Mobility Specialist - Progress Note   03/23/20 1318  Mobility  Activity Contraindicated/medical hold   Instructed by RN to hold mobility for today.   Mamie Levers Mobility Specialist Mobility Specialist Phone: [phone]

## 2020-03-23 NOTE — Progress Notes (Signed)
OT Cancellation Note  Patient Details Name: Jeffrey Serrano MRN: [id number] DOB: 12-23-1950   Cancelled Treatment:    Reason Eval/Treat Not Completed: Medical issues which prohibited therapy Due to bleeding at surgical site involving femoral artery, therapy is being held today per PA request. Therapy will continue to follow and check back in when medically appropriate.   Pollyann Glen E. Shenetta Schnackenberg, COTA/L Acute Rehabilitation Services [phone]  Cherlyn Cushing 03/23/2020, 1:36 PM

## 2020-03-23 NOTE — Progress Notes (Addendum)
Progress Note    03/23/2020 9:01 AM 8 Days Post-Op  Subjective:  Seen at bedside with Dr. Randie Heinz   Vitals:   03/23/20 0820 03/23/20 0837  BP: 97/63 108/64  Pulse: 93 93  Resp: 20 17  Temp: 98.8 F (37.1 C)   SpO2: 100%     Physical Exam: Cardiac:  RRR Lungs:  Non-labored Incisions:  Right groin incision well approximated without drainage. Small amount of old blood oozing from right thigh proximal saphenectomy incision and small amount of serous drainage from distal thigh incision.  No signs of infection. Left foot warm with intact sensation and motor function. Extremities: Mild to moderate left thigh edema   CBC    Component Value Date/Time   WBC 15.6 (H) 03/23/2020 0101   RBC 2.52 (L) 03/23/2020 0101   HGB 7.9 (L) 03/23/2020 0101   HGB 17.0 12/09/2018 1606   HCT 23.6 (L) 03/23/2020 0101   HCT 47.8 12/09/2018 1606   PLT 319 03/23/2020 0101   PLT 243 12/09/2018 1606   MCV 93.7 03/23/2020 0101   MCV 94 12/09/2018 1606   MCH 31.3 03/23/2020 0101   MCHC 33.5 03/23/2020 0101   RDW 15.0 03/23/2020 0101   RDW 12.8 12/09/2018 1606   LYMPHSABS 4.1 (H) 12/09/2018 1606   MONOABS 1,161 (H) 08/07/2016 1403   EOSABS 0.1 12/09/2018 1606   BASOSABS 0.1 12/09/2018 1606    BMET    Component Value Date/Time   NA 134 (L) 03/18/2020 0420   NA 138 01/07/2020 1142   K 3.8 03/18/2020 0420   CL 102 03/18/2020 0420   CO2 23 03/18/2020 0420   GLUCOSE 112 (H) 03/18/2020 0420   BUN 12 03/18/2020 0420   BUN 11 01/07/2020 1142   CREATININE 1.12 03/18/2020 0420   CREATININE 1.42 (H) 12/20/2019 1421   CALCIUM 8.1 (L) 03/18/2020 0420   GFRNONAA >60 03/18/2020 0420   GFRNONAA 44 (L) 08/07/2016 1403   GFRAA 62 01/07/2020 1142   GFRAA 50 (L) 08/07/2016 1403     Intake/Output Summary (Last 24 hours) at 03/23/2020 0901 Last data filed at 03/23/2020 0544 Gross per 24 hour  Intake 480 ml  Output 800 ml  Net -320 ml    HOSPITAL MEDICATIONS Scheduled Meds: . aspirin EC  81 mg Oral  Daily  . Chlorhexidine Gluconate Cloth  6 each Topical Daily  . cholecalciferol  2,000 Units Oral Daily  . clopidogrel  75 mg Oral Daily  . docusate sodium  100 mg Oral Daily  . gabapentin  100 mg Oral Daily  . lacosamide  150 mg Oral q AM  . lacosamide  200 mg Oral QHS  . pantoprazole  40 mg Oral Daily  . Rivaroxaban  15 mg Oral BID WC   Followed by  . [START ON 04/13/2020] rivaroxaban  20 mg Oral Q supper  . rosuvastatin  20 mg Oral Daily   Continuous Infusions: . magnesium sulfate bolus IVPB     PRN Meds:.acetaminophen **OR** acetaminophen, alum & mag hydroxide-simeth, bisacodyl, fentaNYL (SUBLIMAZE) injection, guaiFENesin-dextromethorphan, hydrALAZINE, HYDROcodone-acetaminophen, labetalol, magnesium sulfate bolus IVPB, metoprolol tartrate, ondansetron, phenol, polyethylene glycol  Assessment: POD 8 right fem to BK pop bypass with PTFE for RLE ischemia with rest pain. RLE well perfused. VSS. Afebrile. Sys BP remains in 90-100s. Oozing from right thigh saphenectomy incision. Platelet count normal. Hgb 8.6>>7.9 Right femoral DVT  Plan: -Continue observation of RLE incisions. CBC in AM -Now off heparin infusion and on Xarelto 15 mg for 21 days then 20  mg daily. -Continue Plavix, Aspirin and statin. -dispo: home with HHPT    Risa Grill, PA-C [facility] [phone] 03/23/2020  9:01 AM    I have interviewed and examined patient with PA and agree with assessment and plan above.   Kammy Klett C. Donzetta Matters, MD [facility] [place] Office: [phone] Pager: [phone]

## 2020-03-23 NOTE — Progress Notes (Addendum)
The dressing on R upper thigh was saturated. Applied new dressing with ABD pad . PA notified. Will continue to monitor the pt  Lawson Radar, RN

## 2020-03-23 NOTE — Progress Notes (Addendum)
Pt experiencing moderated amount of sanguineous from the R thigh and small amount of serosanguinous in R lower leg when pt walking to bathroom. Reinforced the incisions with gauze and tape. Had pt on bedrest. Paged PA. Pt stable. will continue to monitor the pt. Hold aspirinl, plavix and xelrelto for now.  Updated: ok to give aspirin, plavix, and xarelto per PA.   Lawson Radar, RN

## 2020-03-24 LAB — CBC
HCT: 21 % — ABNORMAL LOW (ref 39.0–52.0)
HCT: 24.9 % — ABNORMAL LOW (ref 39.0–52.0)
Hemoglobin: 6.8 g/dL — CL (ref 13.0–17.0)
Hemoglobin: 8 g/dL — ABNORMAL LOW (ref 13.0–17.0)
MCH: 31.1 pg (ref 26.0–34.0)
MCH: 30.5 pg (ref 26.0–34.0)
MCHC: 32.4 g/dL (ref 30.0–36.0)
MCHC: 32.1 g/dL (ref 30.0–36.0)
MCV: 95.9 fL (ref 80.0–100.0)
MCV: 95 fL (ref 80.0–100.0)
Platelets: 293 10*3/uL (ref 150–400)
Platelets: 283 10*3/uL (ref 150–400)
RBC: 2.19 MIL/uL — ABNORMAL LOW (ref 4.22–5.81)
RBC: 2.62 MIL/uL — ABNORMAL LOW (ref 4.22–5.81)
RDW: 15.6 % — ABNORMAL HIGH (ref 11.5–15.5)
RDW: 15.4 % (ref 11.5–15.5)
WBC: 14.6 10*3/uL — ABNORMAL HIGH (ref 4.0–10.5)
WBC: 17.3 10*3/uL — ABNORMAL HIGH (ref 4.0–10.5)
nRBC: 0 % (ref 0.0–0.2)
nRBC: 0 % (ref 0.0–0.2)

## 2020-03-24 LAB — PREPARE RBC (CROSSMATCH)

## 2020-03-24 MED ORDER — SODIUM CHLORIDE 0.9% IV SOLUTION: Freq: Once | INTRAVENOUS | Status: AC

## 2020-03-24 NOTE — Progress Notes (Addendum)
CRITICAL VALUE ALERT  Critical Value:  Hgb 6.8  Date & Time Notied:  03/24/20 0650  Provider Notified: will pass along to day team  Orders Received/Actions taken: will pass along to day team.   Assessed pt leg, ACE wrap on upper thigh dry. Gauze dressing on inner knee w/ small amount of serosanguineous drainage. Calf incisions CDI and OTA. Pt w/ no complaints. HR 80s.

## 2020-03-24 NOTE — Care Management Important Message (Signed)
Important Message  Patient Details  Name: Jeffrey Serrano MRN: [id number] Date of Birth: Jun 24, 1950   Medicare Important Message Given:  Yes     Shelda Altes 03/24/2020, 11:08 AM

## 2020-03-24 NOTE — Progress Notes (Signed)
Mobility Specialist: Progress Note   03/24/20 1430  Mobility  Activity Contraindicated/medical hold   Pt Hgb level 6.8. Pt receiving blood. Will f/u tomorrow.   [facility] Daylee Delahoz Mobility Specialist

## 2020-03-24 NOTE — Progress Notes (Signed)
Physical Therapy Treatment Patient Details Name: Jeffrey Serrano MRN: [id number] DOB: 1950/11/28 Today's Date: 03/24/2020    History of Present Illness 70 y.o. male with lower extremity claudication.  He has hypertension as a risk factor.  Patient takes Plavix now status post stent of right external iliac artery.  Underwent a BYPASS GRAFT RIGHT FEMORAL TO BELOW KNEE POPLITEAL ARTERY (Right) 12/29.  Was transferred to the ICU for R flank pain and hypotension after surgery.    PT Comments    Pt supine in bed on arrival this session.  Pt eager to mobilize as he has been in bed for two days due to various medical reasons.  Pt performing well but remains sore. Plan for stair training next session.  Should d/c home tomorrow.     Follow Up Recommendations  Home health PT;Supervision/Assistance - 24 hour     Equipment Recommendations  Rolling walker with 5" wheels;3in1 (PT)    Recommendations for Other Services       Precautions / Restrictions Precautions Precautions: Fall Restrictions Weight Bearing Restrictions: No    Mobility  Bed Mobility Overal bed mobility: Needs Assistance Bed Mobility: Supine to Sit;Sit to Supine     Supine to sit: Min assist     General bed mobility comments: Min assistance to advance RLE to edge of bed and elevate trunk into a seated position.  Pt using PTA as a railing this session.  Transfers Overall transfer level: Needs assistance Equipment used: Rolling walker (2 wheeled) Transfers: Sit to/from Stand Sit to Stand: Min guard Stand pivot transfers: Min guard       General transfer comment: Cues for hand placement to and from seated surface.  Ambulation/Gait Ambulation/Gait assistance: Min guard Gait Distance (Feet): 120 Feet Assistive device: Rolling walker (2 wheeled) Gait Pattern/deviations: Decreased stance time - right;Decreased weight shift to right;Decreased step length - left;Drifts right/left;Wide base of support;Antalgic;Step-through  pattern;Step-to pattern     General Gait Details: Cues for progression to step through pattern.  Shortened stride length noted on L side.  Cues for sequencing and upper.  Cues to step closer to device.   Stairs             Wheelchair Mobility    Modified Rankin (Stroke Patients Only)       Balance Overall balance assessment: Needs assistance   Sitting balance-Leahy Scale: Good       Standing balance-Leahy Scale: Fair                              Cognition Arousal/Alertness: Awake/alert Behavior During Therapy: Flat affect Overall Cognitive Status: Within Functional Limits for tasks assessed Area of Impairment: Problem solving                             Problem Solving: Slow processing;Requires verbal cues General Comments: Pt able to follow commands during session, cognition not formally assessed but appears with in normal limits.      Exercises      General Comments        Pertinent Vitals/Pain Pain Assessment: Faces Faces Pain Scale: Hurts little more Pain Location: R leg.  Patient is very stoic, and denies pain as any thing that will bother him. Pain Descriptors / Indicators: Tightness Pain Intervention(s): Monitored during session;Repositioned    Home Living  Prior Function            PT Goals (current goals can now be found in the care plan section) Acute Rehab PT Goals Patient Stated Goal: to go home Potential to Achieve Goals: Good Progress towards PT goals: Progressing toward goals    Frequency    Min 3X/week      PT Plan Current plan remains appropriate    Co-evaluation              AM-PAC PT "6 Clicks" Mobility   Outcome Measure  Help needed turning from your back to your side while in a flat bed without using bedrails?: None Help needed moving from lying on your back to sitting on the side of a flat bed without using bedrails?: A Little Help needed moving to and  from a bed to a chair (including a wheelchair)?: A Little Help needed standing up from a chair using your arms (e.g., wheelchair or bedside chair)?: A Little Help needed to walk in hospital room?: A Little Help needed climbing 3-5 steps with a railing? : A Little 6 Click Score: 19    End of Session Equipment Utilized During Treatment: Gait belt Activity Tolerance: Patient limited by fatigue Patient left: in chair;with call bell/phone within reach Nurse Communication: Mobility status PT Visit Diagnosis: Unsteadiness on feet (R26.81);Muscle weakness (generalized) (M62.81);Dizziness and giddiness (R42)     Time: 9326-7124 PT Time Calculation (min) (ACUTE ONLY): 22 min  Charges:  $Gait Training: 8-22 mins                     Erasmo Leventhal , PTA Acute Rehabilitation Services Pager [phone] Office [phone]     [place] Eli Hose 03/24/2020, 5:16 PM

## 2020-03-24 NOTE — Progress Notes (Addendum)
  Progress Note    03/24/2020 7:53 AM 9 Days Post-Op  Subjective:  No major complaints   Vitals:   03/24/20 0310 03/24/20 0741  BP: 118/72 95/64  Pulse: 80 95  Resp:  16  Temp: 98.2 F (36.8 C) 98.1 F (36.7 C)  SpO2: 100% 100%   Physical Exam: Cardiac: regular Lungs:  Non labored Incisions:  Right groin incisions and right leg incisions clean dry and intact. Small amount of blood oozing from right proximal thigh saphenectomy incision. Compressive dressing reapplied Extremities: well perfused and warm. Right lower extremity edematous. Doppler Dp/ PT signals right foot Neurologic: alert and oriented  CBC    Component Value Date/Time   WBC 14.6 (H) 03/24/2020 0605   RBC 2.19 (L) 03/24/2020 0605   HGB 6.8 (LL) 03/24/2020 0605   HGB 17.0 12/09/2018 1606   HCT 21.0 (L) 03/24/2020 0605   HCT 47.8 12/09/2018 1606   PLT 293 03/24/2020 0605   PLT 243 12/09/2018 1606   MCV 95.9 03/24/2020 0605   MCV 94 12/09/2018 1606   MCH 31.1 03/24/2020 0605   MCHC 32.4 03/24/2020 0605   RDW 15.6 (H) 03/24/2020 0605   RDW 12.8 12/09/2018 1606   LYMPHSABS 4.1 (H) 12/09/2018 1606   MONOABS 1,161 (H) 08/07/2016 1403   EOSABS 0.1 12/09/2018 1606   BASOSABS 0.1 12/09/2018 1606    BMET    Component Value Date/Time   NA 134 (L) 03/18/2020 0420   NA 138 01/07/2020 1142   K 3.8 03/18/2020 0420   CL 102 03/18/2020 0420   CO2 23 03/18/2020 0420   GLUCOSE 112 (H) 03/18/2020 0420   BUN 12 03/18/2020 0420   BUN 11 01/07/2020 1142   CREATININE 1.12 03/18/2020 0420   CREATININE 1.42 (H) 12/20/2019 1421   CALCIUM 8.1 (L) 03/18/2020 0420   GFRNONAA >60 03/18/2020 0420   GFRNONAA 44 (L) 08/07/2016 1403   GFRAA 62 01/07/2020 1142   GFRAA 50 (L) 08/07/2016 1403    INR    Component Value Date/Time   INR 1.1 03/08/2020 1138     Intake/Output Summary (Last 24 hours) at 03/24/2020 0753 Last data filed at 03/23/2020 2320 Gross per 24 hour  Intake 1440 ml  Output 2600 ml  Net -1160 ml      Assessment/Plan:  70 y.o. male is s/p right fem to BK pop bypass with PTFE for RLE ischemia with rest pain 9 Days Post-Op. RLE well perfused with dopper DP/ PT signals .Afebrile. Remains hypotensive. Oozing from right thigh saphenectomy incision- presently stable. Compressive dressings reapplied.  Hgb 6.8 this morning. Transfuse 1 unit PRBC. On Rivaroxaban. Right femoral DVT. Continue to mobilize as tolerated. Plan for discharge home with HHPT possibly tomorrow  DVT prophylaxis:  Rivaroxaban   Karoline Caldwell, PA-C [facility] [phone] 03/24/2020 7:53 AM   I have independently interviewed and examined patient and agree with PA assessment and plan above.  Transfuse 1 unit this morning.  We will continue to need anticoagulation for postoperative DVT.  We will continue to mobilize and discharge when his H&H has stabilized  Parthiv Mucci C. Donzetta Matters, MD [facility] [place] Office: [phone] Pager: [phone]

## 2020-03-24 NOTE — Care Management Important Message (Signed)
Important Message  Patient Details  Name: Jeffrey Serrano MRN: [id number] Date of Birth: Jul 03, 1950   Medicare Important Message Given:  Yes     Shelda Altes 03/24/2020, 11:05 AM

## 2020-03-25 LAB — CBC
HCT: 22.4 % — ABNORMAL LOW (ref 39.0–52.0)
Hemoglobin: 7.3 g/dL — ABNORMAL LOW (ref 13.0–17.0)
MCH: 30.8 pg (ref 26.0–34.0)
MCHC: 32.6 g/dL (ref 30.0–36.0)
MCV: 94.5 fL (ref 80.0–100.0)
Platelets: 316 10*3/uL (ref 150–400)
RBC: 2.37 MIL/uL — ABNORMAL LOW (ref 4.22–5.81)
RDW: 15.6 % — ABNORMAL HIGH (ref 11.5–15.5)
WBC: 16.1 10*3/uL — ABNORMAL HIGH (ref 4.0–10.5)
nRBC: 0 % (ref 0.0–0.2)

## 2020-03-25 LAB — TYPE AND SCREEN
ABO/RH(D): A POS
Antibody Screen: NEGATIVE
Unit division: 0

## 2020-03-25 LAB — BPAM RBC
Blood Product Expiration Date: 202201272359
ISSUE DATE / TIME: 202201071207
Unit Type and Rh: 6200

## 2020-03-25 NOTE — Progress Notes (Signed)
Physical Therapy Treatment Patient Details Name: Jeffrey Serrano MRN: [id number] DOB: 05/03/1950 Today's Date: 03/25/2020    History of Present Illness 70 y.o. male with lower extremity claudication.  He has hypertension as a risk factor.  Patient takes Plavix now status post stent of right external iliac artery.  Underwent a BYPASS GRAFT RIGHT FEMORAL TO BELOW KNEE POPLITEAL ARTERY (Right) 12/29.  Was transferred to the ICU for R flank pain and hypotension after surgery.    PT Comments    Pt received in bed. Initially hesitant to participate in therapy due to edema and recent increased bleeding RLE. Pt agreeable with encouragement. He required min assist bed mobility, min guard assist transfers, and min guard assist ambulation 150' with RW. He presents with antalgic gait. Pt in recliner with feet elevated at end of session. Stairs deferred today due to hypotension (103/73) and low hgb (7.3). Hgb is again trending down receiving 1 unit PRBC yesterday.   Follow Up Recommendations  Home health PT;Supervision/Assistance - 24 hour     Equipment Recommendations  Rolling walker with 5" wheels;3in1 (PT)    Recommendations for Other Services       Precautions / Restrictions Precautions Precautions: Fall    Mobility  Bed Mobility Overal bed mobility: Needs Assistance Bed Mobility: Supine to Sit     Supine to sit: Min assist;HOB elevated     General bed mobility comments: +rail, increased time, assist with RLE  Transfers Overall transfer level: Needs assistance Equipment used: Rolling walker (2 wheeled) Transfers: Sit to/from Stand Sit to Stand: Min guard;From elevated surface         General transfer comment: cues for hand placement  Ambulation/Gait Ambulation/Gait assistance: Min guard Gait Distance (Feet): 150 Feet Assistive device: Rolling walker (2 wheeled) Gait Pattern/deviations: Decreased weight shift to right;Step-through pattern;Step-to pattern;Antalgic Gait  velocity: decreased Gait velocity interpretation: <1.31 ft/sec, indicative of household ambulator General Gait Details: min guard for safety   Stairs             Wheelchair Mobility    Modified Rankin (Stroke Patients Only)       Balance Overall balance assessment: Needs assistance Sitting-balance support: No upper extremity supported;Feet supported Sitting balance-Leahy Scale: Good     Standing balance support: Bilateral upper extremity supported;During functional activity;No upper extremity supported Standing balance-Leahy Scale: Fair Standing balance comment: RW for amb                            Cognition Arousal/Alertness: Awake/alert Behavior During Therapy: Flat affect Overall Cognitive Status: No family/caregiver present to determine baseline cognitive functioning Area of Impairment: Attention;Problem solving;Memory                   Current Attention Level: Selective Memory: Decreased short-term memory       Problem Solving: Slow processing;Difficulty sequencing;Requires verbal cues General Comments: Difficulty staying on task, tangential speech      Exercises      General Comments General comments (skin integrity, edema, etc.): BP 103/73 in recliner, Hgb 7.3, max HR 122, resting HR 90      Pertinent Vitals/Pain Pain Assessment: Faces Faces Pain Scale: Hurts little more Pain Location: RLE Pain Descriptors / Indicators: Sore;Tightness Pain Intervention(s): Repositioned;Monitored during session    Home Living                      Prior Function  PT Goals (current goals can now be found in the care plan section) Acute Rehab PT Goals Patient Stated Goal: to go home Progress towards PT goals: Progressing toward goals    Frequency    Min 3X/week      PT Plan Current plan remains appropriate    Co-evaluation              AM-PAC PT "6 Clicks" Mobility   Outcome Measure  Help needed  turning from your back to your side while in a flat bed without using bedrails?: None Help needed moving from lying on your back to sitting on the side of a flat bed without using bedrails?: A Little Help needed moving to and from a bed to a chair (including a wheelchair)?: A Little Help needed standing up from a chair using your arms (e.g., wheelchair or bedside chair)?: A Little Help needed to walk in hospital room?: A Little Help needed climbing 3-5 steps with a railing? : A Little 6 Click Score: 19    End of Session Equipment Utilized During Treatment: Gait belt Activity Tolerance: Patient tolerated treatment well Patient left: in chair;with call bell/phone within reach Nurse Communication: Mobility status PT Visit Diagnosis: Unsteadiness on feet (R26.81);Muscle weakness (generalized) (M62.81);Dizziness and giddiness (R42)     Time: 5859-2924 PT Time Calculation (min) (ACUTE ONLY): 24 min  Charges:  $Gait Training: 23-37 mins                     Lorrin Goodell, [place]  Office # [phone] Pager [phone]    Lorriane Shire 03/25/2020, 1:11 PM

## 2020-03-25 NOTE — Progress Notes (Signed)
Mobility Specialist: Progress Note   03/25/20 1158  Mobility  Activity Ambulated in hall  Level of Assistance Minimal assist, patient does 75% or more  Assistive Device Front wheel walker  Distance Ambulated (ft) 310 ft  Mobility Response Tolerated fair  Mobility performed by Mobility specialist  Bed Position Chair  $Mobility charge 1 Mobility   Pre-Mobility:            Sitting: 90 HR, 98/73 BP           Standing: 110/73 BP During Mobility: 121 HR Post-Mobility:            Sitting: 112 HR, 90/62 BP           Sitting 1 minute: 101/66 BP  Pt c/o pain in his R LE and scrotum during ambulation, no rating given. RN notified if pt's pressure change post ambulation.  [facility] Camrin Lapre Mobility Specialist

## 2020-03-25 NOTE — Progress Notes (Addendum)
[facility] [place]  Subjective  - Cont. To have pain and edema in the right LE   Objective 105/63 89 98.8 F (37.1 C) (Oral) 20 100%  Intake/Output Summary (Last 24 hours) at 03/25/2020 0932 Last data filed at 03/24/2020 1538 Gross per 24 hour  Intake 463.5 ml  Output --  Net 463.5 ml   DVT duplex 03/20/20   RIGHT:  - Findings consistent with acute deep vein thrombosis involving the right  femoral vein, right posterior tibial veins, and right peroneal veins.   Right medial thigh dressing changed with bright bloody drainage on dressing, new compression dressing applied. Doppler signals right LE DP/PT intact Motor intact and sensation Right groin soft without hematoma  Assessment/Planning: POD #10 70 y.o. male is s/p right fem to BK pop bypass with PTFE for RLE ischemia with rest pain  Well perused right LE with continued bleeding upper medial vein harvest sight. HGB down from 8.0 after 1 unit PRBC to 7.3.  Continue compression dressing and redraw labs in the am.   Cont anticoagulation for now secondary to post op DVT Remains hypotensive, but improved.  Roxy Horseman 03/25/2020 9:32 AM --  Laboratory Lab Results: Recent Labs    03/24/20 1905 03/25/20 0128  WBC 17.3* 16.1*  HGB 8.0* 7.3*  HCT 24.9* 22.4*  PLT 283 316   BMET No results for input(s): NA, K, CL, CO2, GLUCOSE, BUN, CREATININE, CALCIUM in the last 72 hours.  COAG Lab Results  Component Value Date   INR 1.1 03/08/2020   INR 1.1 06/11/2019   INR 1.0 08/07/2016   No results found for: PTT   I have seen and evaluated the patient. I agree with the PA note as documented above.  70 year old male status post right common femoral endarterectomy with common femoral to peroneal artery bypass with PTFE given vein was not adequate after harvest.  For CLI with rest pain.  He does have a good peroneal signal.  He has ambulated in the hall.  He did have some additional bleeding  from his saphenectomy site in the proximal thigh.  Hgb 8 --> 7.3.  We will keep him one more night to monitor this - wrapped with ace and clean dressing.  He is on Xarelto for DVT.  Marty Heck, MD [facility] [place] Office: [phone]

## 2020-03-26 ENCOUNTER — Inpatient Hospital Stay (HOSPITAL_COMMUNITY): Payer: PPO

## 2020-03-26 LAB — BASIC METABOLIC PANEL
Anion gap: 12 (ref 5–15)
BUN: 14 mg/dL (ref 8–23)
CO2: 17 mmol/L — ABNORMAL LOW (ref 22–32)
Calcium: 8.1 mg/dL — ABNORMAL LOW (ref 8.9–10.3)
Chloride: 102 mmol/L (ref 98–111)
Creatinine, Ser: 1.2 mg/dL (ref 0.61–1.24)
GFR, Estimated: >60 mL/min (ref >60)
Glucose, Bld: 113 mg/dL — ABNORMAL HIGH (ref 70–99)
Potassium: 3.9 mmol/L (ref 3.5–5.1)
Sodium: 131 mmol/L — ABNORMAL LOW (ref 135–145)

## 2020-03-26 LAB — CBC
HCT: 23.7 % — ABNORMAL LOW (ref 39.0–52.0)
Hemoglobin: 7.8 g/dL — ABNORMAL LOW (ref 13.0–17.0)
MCH: 30.6 pg (ref 26.0–34.0)
MCHC: 32.9 g/dL (ref 30.0–36.0)
MCV: 92.9 fL (ref 80.0–100.0)
Platelets: 375 10*3/uL (ref 150–400)
RBC: 2.55 MIL/uL — ABNORMAL LOW (ref 4.22–5.81)
RDW: 15.5 % (ref 11.5–15.5)
WBC: 18 10*3/uL — ABNORMAL HIGH (ref 4.0–10.5)
nRBC: 0 % (ref 0.0–0.2)

## 2020-03-26 NOTE — Progress Notes (Addendum)
[facility]   Subjective  - Pain issues, edema and bloody drainage right LE   Objective 100/66 90 98.1 F (36.7 C) (Oral) 16 100%  Intake/Output Summary (Last 24 hours) at 03/26/2020 9628 Last data filed at 03/25/2020 1400 Gross per 24 hour  Intake 240 ml  Output --  Net 240 ml    Doppler signals intact right DP/PT DVT with edema Medial thigh vein harvest site with continue bloody drainage.  HGB stable 7.8.  Hematoma firmness surrounding the incision. Groin incision intact  Lungs non labored breathing  Assessment/Planning: POD # 70 y.o.maleis s/p right fem to BK pop bypass with PTFE for RLE ischemia with rest pain  HGB stable, leukocytosis with elevation to 18.  He is asymptomatic.  I will order blood cultures and chest x ray since he has a PTFE bypass.   He denise fever and chills.  Incision show no sign of infection.   He is on Xarelto for DVT.   Roxy Horseman 03/26/2020 8:21 AM --  Laboratory Lab Results: Recent Labs    03/25/20 0128 03/26/20 0216  WBC 16.1* 18.0*  HGB 7.3* 7.8*  HCT 22.4* 23.7*  PLT 316 375   BMET Recent Labs    03/26/20 0216  NA 131*  K 3.9  CL 102  CO2 17*  GLUCOSE 113*  BUN 14  CREATININE 1.20  CALCIUM 8.1*    COAG Lab Results  Component Value Date   INR 1.1 03/08/2020   INR 1.1 06/11/2019   INR 1.0 08/07/2016   No results found for: PTT  I have seen and evaluated the patient. I agree with the PA note as documented above. 70 year old male status post right common femoral endarterectomy with common femoral to peroneal artery bypass with PTFE given vein was not adequate after harvest in setting of CLI with rest pain.  He does have a good peroneal signal again today.  Saphenectomy site where previous bleeding looks ok and dressed.  Hgb7.3 --> 7.8 today.    He feels he needs one more day.  He is on Xarelto for DVT.  He does have a ongoing leukocytosis in the hospital.  All of his  incisions look fine.  This could be related to his DVT. No fevers otherwise.    Marty Heck, MD [facility] [place] Office: [phone]

## 2020-03-26 NOTE — Progress Notes (Signed)
Mobility Specialist: Progress Note   03/26/20 1345  Mobility  Activity Ambulated in hall  Level of Assistance Minimal assist, patient does 75% or more  Assistive Device Front wheel walker  Distance Ambulated (ft) 300 ft  Mobility Response Tolerated well  Mobility performed by Mobility specialist  Bed Position Chair  $Mobility charge 1 Mobility   Pre-Mobility: 86 HR, 102/65 BP, 100% SpO2 Post-Mobility: 101 HR, 91/63 BP, 100% SpO2  Pt c/o pain in his R LE, no rating given. Pt to chair after ambulation per request for lunch.   [facility] Anina Schnake Mobility Specialist

## 2020-03-27 ENCOUNTER — Other Ambulatory Visit (HOSPITAL_COMMUNITY): Payer: Self-pay | Admitting: Physician Assistant

## 2020-03-27 LAB — CBC
HCT: 28.9 % — ABNORMAL LOW (ref 39.0–52.0)
Hemoglobin: 9.2 g/dL — ABNORMAL LOW (ref 13.0–17.0)
MCH: 30.5 pg (ref 26.0–34.0)
MCHC: 31.8 g/dL (ref 30.0–36.0)
MCV: 95.7 fL (ref 80.0–100.0)
Platelets: 407 10*3/uL — ABNORMAL HIGH (ref 150–400)
RBC: 3.02 MIL/uL — ABNORMAL LOW (ref 4.22–5.81)
RDW: 15.9 % — ABNORMAL HIGH (ref 11.5–15.5)
WBC: 13.9 10*3/uL — ABNORMAL HIGH (ref 4.0–10.5)
nRBC: 0 % (ref 0.0–0.2)

## 2020-03-27 MED ORDER — RIVAROXABAN 15 MG PO TABS: 15.0000 mg | ORAL_TABLET | Freq: Two times a day (BID) | ORAL | 0 refills | Status: DC

## 2020-03-27 MED ORDER — RIVAROXABAN 20 MG PO TABS: 20.0000 mg | ORAL_TABLET | Freq: Every day | ORAL | 4 refills | Status: AC

## 2020-03-27 MED ORDER — ROSUVASTATIN CALCIUM 10 MG PO TABS: 20.0000 mg | ORAL_TABLET | Freq: Every day | ORAL | 11 refills | Status: AC

## 2020-03-27 MED ORDER — HYDROCODONE-ACETAMINOPHEN 5-325 MG PO TABS: 1.0000 | ORAL_TABLET | Freq: Four times a day (QID) | ORAL | 0 refills | Status: DC | PRN

## 2020-03-27 MED FILL — XARELTO 15 MG TABLET: 15 | 21 days supply | Qty: 42 | Fill #0

## 2020-03-27 MED FILL — HYDROCODON-APAP 5-325: 5-325 | 7 days supply | Qty: 30 | Fill #0

## 2020-03-27 NOTE — Progress Notes (Signed)
Occupational Therapy Treatment Patient Details Name: Jeffrey Serrano MRN: [id number] DOB: 01/04/1951 Today's Date: 03/27/2020    History of present illness 70 y.o. male with lower extremity claudication.  He has hypertension as a risk factor.  Patient takes Plavix now status post stent of right external iliac artery.  Underwent a BYPASS GRAFT RIGHT FEMORAL TO BELOW KNEE POPLITEAL ARTERY (Right) 12/29.  Was transferred to the ICU for R flank pain and hypotension after surgery.   OT comments  Pt seated in recliner upon OTA arrival with pts brother present. Pt dressed and anxiously awaiting DC. Pt declined functional mobility or ADL participation, therefore session focus on education related to DME options for safe tub shower transfer. Provided handouts of available shower seats as well as handout of safe shower transfer technique to shower seat in tub shower. Pt, and pts brother verbalized understanding. Per pt, pt required assist with LB dressing tasks, brother confirmed that he will be available to assist with LB dressing at home. Pt would continue to benefit from skilled occupational therapy while admitted and after d/c to address the below listed limitations in order to improve overall functional mobility and facilitate independence with BADL participation. DC plan remains appropriate, will follow acutely per POC.    Follow Up Recommendations  Home health OT    Equipment Recommendations  Tub/shower seat    Recommendations for Other Services      Precautions / Restrictions Precautions Precautions: Fall Restrictions Weight Bearing Restrictions: No       Mobility Bed Mobility Overal bed mobility: Needs Assistance Bed Mobility: Supine to Sit     Supine to sit: Min guard;HOB elevated     General bed mobility comments: Pt OOB in recliner  Transfers Overall transfer level: Needs assistance Equipment used: Rolling walker (2 wheeled) Transfers: Sit to/from Stand Sit to Stand: Min  guard         General transfer comment: cues for hand placement    Balance Overall balance assessment: Needs assistance Sitting-balance support: No upper extremity supported;Feet supported Sitting balance-Leahy Scale: Good     Standing balance support: Bilateral upper extremity supported;During functional activity;No upper extremity supported Standing balance-Leahy Scale: Fair Standing balance comment: RW for amb                           ADL either performed or assessed with clinical judgement   ADL Overall ADL's : Needs assistance/impaired                                   Tub/Shower Transfer Details (indicate cue type and reason): verbally reviewed tub shower transfer to shower seat with pt and pts brother, additionally issued pt visual handout to increase carryover of safe transfer technique. pt declined practicing tranfer with this OTA however pt and pts brother verbalized understanding of technique   General ADL Comments: pt seated in recliner upon arrival with pts brother present. pt awaiting DC. pt reprots needing assistance getting dressed moslty for LB dressing. pts brother agreed that he woud assist pt with LB Dressing at home. pt declinec ADL participation or functional mobility, therefore session focus on family and pt education related to compensatory methods for ADLs and safe shower transfer technique     Vision       Perception     Praxis      Cognition Arousal/Alertness: Awake/alert Behavior During Therapy:  Flat affect Overall Cognitive Status: Impaired/Different from baseline Area of Impairment: Attention;Following commands                   Current Attention Level: Selective Memory: Decreased short-term memory Following Commands: Follows one step commands with increased time;Follows multi-step commands with increased time     Problem Solving: Slow processing;Difficulty sequencing;Requires verbal cues General  Comments: pt seemed internally distracted, cues needed to orient to session        Exercises     Shoulder Instructions       General Comments pt reports dizziness experienced this AM with PT was d/t getting up too quickly, no other s/s noted during session    Pertinent Vitals/ Pain       Pain Assessment: Faces Faces Pain Scale: Hurts a little bit Pain Location: R thigh Pain Descriptors / Indicators: Sore;Tightness Pain Intervention(s): Monitored during session  Home Living                                          Prior Functioning/Environment              Frequency  Min 2X/week        Progress Toward Goals  OT Goals(current goals can now be found in the care plan section)  Progress towards OT goals: Progressing toward goals  Acute Rehab OT Goals Patient Stated Goal: to go home OT Goal Formulation: With patient Time For Goal Achievement: 03/30/20 Potential to Achieve Goals: Good  Plan Discharge plan remains appropriate;Frequency remains appropriate    Co-evaluation                 AM-PAC OT "6 Clicks" Daily Activity     Outcome Measure   Help from another person eating meals?: None Help from another person taking care of personal grooming?: A Little Help from another person toileting, which includes using toliet, bedpan, or urinal?: A Little Help from another person bathing (including washing, rinsing, drying)?: A Lot Help from another person to put on and taking off regular upper body clothing?: None Help from another person to put on and taking off regular lower body clothing?: A Lot 6 Click Score: 18    End of Session    OT Visit Diagnosis: Unsteadiness on feet (R26.81);Muscle weakness (generalized) (M62.81);Pain Pain - part of body: Ankle and joints of foot   Activity Tolerance Patient tolerated treatment well   Patient Left in chair;with call bell/phone within reach;with family/visitor present   Nurse Communication           Time: 3299-2426 OT Time Calculation (min): 8 min  Charges: OT General Charges $OT Visit: 1 Visit OT Treatments $Self Care/Home Management : 8-22 mins  Harley Alto., COTA/L Acute Rehabilitation Services [phone]    Precious Haws 03/27/2020, 1:09 PM

## 2020-03-27 NOTE — Progress Notes (Signed)
Discharge instructions (including medications) discussed with and copy provided to patient/caregiver 

## 2020-03-27 NOTE — Progress Notes (Signed)
Physical Therapy Treatment Patient Details Name: Jeffrey Serrano MRN: [id number] DOB: 1951-02-23 Today's Date: 03/27/2020    History of Present Illness 70 y.o. male with lower extremity claudication.  He has hypertension as a risk factor.  Patient takes Plavix now status post stent of right external iliac artery.  Underwent a BYPASS GRAFT RIGHT FEMORAL TO BELOW KNEE POPLITEAL ARTERY (Right) 12/29.  Was transferred to the ICU for R flank pain and hypotension after surgery.    PT Comments    Pt required min guard assist bed mobility, transfers, and ambulation 40' with RW. Gait distance limited by dizziness as he has previously been ambulating 150' to 300'. Pt sat EOB to see if dizziness would clear but it did not. BP 105/81. Pt feels dizziness is related to meds that RN gave just prior to session. Pt and pt's brother verbally educated in room on ascend/descend 2 steps with rail. Pt in recliner with feet elevated at end of session.   Follow Up Recommendations  Home health PT;Supervision/Assistance - 24 hour     Equipment Recommendations  Rolling walker with 5" wheels;3in1 (PT)    Recommendations for Other Services       Precautions / Restrictions Precautions Precautions: Fall    Mobility  Bed Mobility Overal bed mobility: Needs Assistance Bed Mobility: Supine to Sit     Supine to sit: Min guard;HOB elevated     General bed mobility comments: +rail, increased time, cues for sequencing  Transfers Overall transfer level: Needs assistance Equipment used: Rolling walker (2 wheeled) Transfers: Sit to/from Stand Sit to Stand: Min guard         General transfer comment: cues for hand placement  Ambulation/Gait Ambulation/Gait assistance: Min guard Gait Distance (Feet): 40 Feet Assistive device: Rolling walker (2 wheeled) Gait Pattern/deviations: Decreased stride length;Step-through pattern Gait velocity: decreased Gait velocity interpretation: <1.31 ft/sec, indicative of  household ambulator General Gait Details: min guard for safety, distance limited by dizziness.   Stairs Stairs:  (Verbally educated on ascend/descend 2 steps with rail. Unable to take pt into stairwell due to dizziness. Pt's brother present for education.)           Wheelchair Mobility    Modified Rankin (Stroke Patients Only)       Balance Overall balance assessment: Needs assistance Sitting-balance support: No upper extremity supported;Feet supported Sitting balance-Leahy Scale: Good     Standing balance support: Bilateral upper extremity supported;During functional activity;No upper extremity supported Standing balance-Leahy Scale: Fair Standing balance comment: RW for amb                            Cognition Arousal/Alertness: Awake/alert Behavior During Therapy: Flat affect Overall Cognitive Status: No family/caregiver present to determine baseline cognitive functioning Area of Impairment: Attention;Problem solving;Memory                   Current Attention Level: Selective Memory: Decreased short-term memory       Problem Solving: Slow processing;Difficulty sequencing;Requires verbal cues General Comments: Difficulty staying on task, tangential speech      Exercises      General Comments General comments (skin integrity, edema, etc.): BP 105/81 in sitting      Pertinent Vitals/Pain Pain Assessment: Faces Faces Pain Scale: Hurts little more Pain Location: RLE Pain Descriptors / Indicators: Sore;Tightness Pain Intervention(s): Monitored during session;Repositioned    Home Living  Prior Function            PT Goals (current goals can now be found in the care plan section) Acute Rehab PT Goals Patient Stated Goal: to go home Progress towards PT goals: Progressing toward goals    Frequency    Min 3X/week      PT Plan Current plan remains appropriate    Co-evaluation               AM-PAC PT "6 Clicks" Mobility   Outcome Measure  Help needed turning from your back to your side while in a flat bed without using bedrails?: None Help needed moving from lying on your back to sitting on the side of a flat bed without using bedrails?: A Little Help needed moving to and from a bed to a chair (including a wheelchair)?: A Little Help needed standing up from a chair using your arms (e.g., wheelchair or bedside chair)?: A Little Help needed to walk in hospital room?: A Little Help needed climbing 3-5 steps with a railing? : A Little 6 Click Score: 19    End of Session Equipment Utilized During Treatment: Gait belt Activity Tolerance: Treatment limited secondary to medical complications (Comment) (dizziness) Patient left: in chair;with chair alarm set;with family/visitor present Nurse Communication: Mobility status PT Visit Diagnosis: Unsteadiness on feet (R26.81);Muscle weakness (generalized) (M62.81);Dizziness and giddiness (R42)     Time: 3532-9924 PT Time Calculation (min) (ACUTE ONLY): 15 min  Charges:  $Gait Training: 8-22 mins                     Lorrin Goodell, PT  Office # [phone] Pager [phone]    Lorriane Shire 03/27/2020, 9:53 AM

## 2020-03-27 NOTE — Discharge Summary (Addendum)
[facility] Discharge Summary   Patient ID:  Jeffrey Serrano MRN: [id number] DOB/AGE: [birth year] 71 y.o.  Admit date: 03/15/2020 Discharge date: 03/27/2020 Date of Surgery: 03/15/2020 Surgeon: Surgeon(s): Waynetta Sandy, MD  Admission Diagnosis: PAD (peripheral artery disease) [facility]) [I73.9]  Discharge Diagnoses:  PAD (peripheral artery disease) ([place]) [I73.9]  Secondary Diagnoses: Past Medical History:  Diagnosis Date  . Arthritis   . Hepatitis C   . High cholesterol   . Hypertension   . PVD (peripheral vascular disease) ([place])   . Seizures ([place])    most recent Jan 2020    Procedure(s): BYPASS GRAFT RIGHT FEMORAL TO BELOW KNEE POPLITEAL ARTERY USING 75mm REMOVABLE RING PROPATEN GORE GRAFT  Discharged Condition: stable  HPI:  Jeffrey Serrano is a 70 y.o. male with short distance life limiting claudication.  He has hypertension as a risk factor.  He is taking Plavix now status post stent of right external iliac artery.  He has not had any symptomatic relief.  He does have preischemic changes of his right toes.  He has had cardiac clearance.  He is scheduled for right LE Bypass by Dr. Donzetta Matters.   Hospital Course:  Jeffrey Serrano is a 71 y.o. male is S/P Procedure(s): BYPASS GRAFT RIGHT FEMORAL TO BELOW KNEE POPLITEAL ARTERY USING 33mm REMOVABLE RING PROPATEN GORE GRAFT Post op he developed right medial thigh hematoma with incision vein harvest bloody drainage.  Compression dressing was applied.  By day 12 he has minimal SS bloody drainage from the incision site.  On 03/20/20 he develops edema and DVT duplex showed right fem-peroneal vein DVT he was started on heparin and then converted to Xarelto prior to discharge.  He maintained brisk doppler signals in the right LE all 3 vessels.    He is now ambulatory, stable HGb after 1 unit transfuse PRBC.  He developed leukocytosis with negative blood culture and negative chest x ray likely due to DVT response.   He  will cont. With Plavix, Aspirin and statin, and Xarelot.  HHPT has been arranged.   Significant Diagnostic Studies: CBC Lab Results  Component Value Date   WBC 13.9 (H) 03/27/2020   HGB 9.2 (L) 03/27/2020   HCT 28.9 (L) 03/27/2020   MCV 95.7 03/27/2020   PLT 407 (H) 03/27/2020    BMET    Component Value Date/Time   NA 131 (L) 03/26/2020 0216   NA 138 01/07/2020 1142   K 3.9 03/26/2020 0216   CL 102 03/26/2020 0216   CO2 17 (L) 03/26/2020 0216   GLUCOSE 113 (H) 03/26/2020 0216   BUN 14 03/26/2020 0216   BUN 11 01/07/2020 1142   CREATININE 1.20 03/26/2020 0216   CREATININE 1.42 (H) 12/20/2019 1421   CALCIUM 8.1 (L) 03/26/2020 0216   GFRNONAA >60 03/26/2020 0216   GFRNONAA 44 (L) 08/07/2016 1403   GFRAA 62 01/07/2020 1142   GFRAA 50 (L) 08/07/2016 1403   COAG Lab Results  Component Value Date   INR 1.1 03/08/2020   INR 1.1 06/11/2019   INR 1.0 08/07/2016     Disposition:  Discharge to :Home Discharge Instructions    Call MD for:  redness, tenderness, or signs of infection (pain, swelling, bleeding, redness, odor or green/yellow discharge around incision site)   Complete by: As directed    Call MD for:  severe or increased pain, loss or decreased feeling  in affected limb(s)   Complete by: As directed    Call MD for:  temperature >100.5   Complete by: As directed    Resume previous diet   Complete by: As directed      Allergies as of 03/27/2020   No Known Allergies     Medication List    TAKE these medications   aspirin 81 MG EC tablet Take 1 tablet (81 mg total) by mouth daily. Swallow whole.   clopidogrel 75 MG tablet Commonly known as: Plavix Take 1 tablet (75 mg total) by mouth daily.   gabapentin 100 MG capsule Commonly known as: NEURONTIN Take 100 mg by mouth daily.   hydrochlorothiazide 12.5 MG tablet Commonly known as: HYDRODIURIL Take 1 tablet (12.5 mg total) by mouth daily.   HYDROcodone-acetaminophen 5-325 MG tablet Commonly known  as: NORCO/VICODIN Take 1 tablet by mouth every 6 (six) hours as needed for moderate pain.   ibuprofen 200 MG tablet Commonly known as: ADVIL Take 200 mg by mouth every 6 (six) hours as needed for moderate pain.   lacosamide 200 MG Tabs tablet Commonly known as: Vimpat Take 1 tablet (200 mg total) by mouth at bedtime.   Vimpat 150 MG Tabs Generic drug: Lacosamide Take 1 tablet (150 mg total) by mouth in the morning.   lisinopril 10 MG tablet Commonly known as: ZESTRIL Take 1 tablet (10 mg total) by mouth daily.   Rivaroxaban 15 MG Tabs tablet Commonly known as: XARELTO Take 1 tablet (15 mg total) by mouth 2 (two) times daily with a meal.   rivaroxaban 20 MG Tabs tablet Commonly known as: XARELTO Take 1 tablet (20 mg total) by mouth daily with supper. Start taking on: April 13, 2020   rosuvastatin 10 MG tablet Commonly known as: Crestor Take 2 tablets (20 mg total) by mouth daily. What changed: how much to take   Vitamin D3 25 MCG (1000 UT) Caps Take 2,000 Units by mouth daily.            Durable Medical Equipment  (From admission, onward)         Start     Ordered   03/22/20 0759  For home use only DME Shower stool  Once        03/22/20 0759   03/22/20 0759  For home use only DME Walker rolling  Once       Question Answer Comment  Walker: With 5 Inch Wheels   Patient needs a walker to treat with the following condition S/P femoral-popliteal bypass surgery      03/22/20 0759   03/22/20 0759  For home use only DME 3 n 1  Once        03/22/20 0759         Verbal and written Discharge instructions given to the patient. Wound care per Discharge AVS  Follow-up Information    Waynetta Sandy, MD In 3 weeks.   Specialties: Vascular Surgery, Cardiology Why: Office will call you to arrange your appt (sent) Contact information: [place] [address] [address] Follow up.   Specialty: [place] Why: HHRN/PT/OT arranged- they will contact you to set up home visits       [facility], [facility] Follow up.   Why: rolling walker arranged- to be delivered to room prior to discharge Contact information: [place] [address] [phone]               Signed: Roxy Horseman 03/27/2020, 11:49 AM  I have independently interviewed and examined patient and agree with PA assessment and plan above.  Okay for discharge today.  Doni Widmer C. Donzetta Matters, MD [facility] [place] Office: [phone] Pager: [phone]

## 2020-03-27 NOTE — TOC Transition Note (Signed)
Transition of Care (TOC) - CM/SW Discharge Note Marvetta Gibbons RN, BSN Transitions of Care Unit 4E- RN Case Manager See Treatment Team for direct phone #    Patient Details  Name: Jeffrey Serrano MRN: [id number] Date of Birth: 19-Jun-1950  Transition of Care [facility]) CM/SW Contact:  Dawayne Patricia, RN Phone Number: 03/27/2020, 1:59 PM   Clinical Narrative:    Pt stable for transition home today, confirmed RW has been delivered to room by [employer] for home. Spoke with [place] with [facility] to confirm start of care for [facility] services- RN/PT/OT.  No further TOC needs noted.    Final next level of care: [place] Barriers to Discharge: Barriers Resolved   Patient Goals and CMS Choice Patient states their goals for this hospitalization and ongoing recovery are:: return home CMS Medicare.gov Compare Post Acute Care list provided to:: Patient Choice offered to / list presented to : Patient  Discharge Placement               Home with [facility]        Discharge Plan and Services   Discharge Planning Services: CM Consult Post Acute Care Choice: Home Health,Durable Medical Equipment          DME Arranged: Walker rolling DME Agency: [employer] Date DME Agency Contacted: 03/22/20 Time DME Agency Contacted: [phone] Representative spoke with at DME Agency: Freda Munro [place]: RN,PT,OT [place] Agency: [place] ([place]) Date [place]: 03/22/20 Time [place]: 1030 Representative spoke with at [place]: Kensey  Social Determinants of Health ([place]) Interventions     Readmission Risk Interventions Readmission Risk Prevention Plan 03/22/2020  Post Dischage Appt Complete  Medication Screening Complete  Transportation Screening Complete  Some recent data might be hidden

## 2020-03-27 NOTE — Progress Notes (Signed)
[facility] [place]  Subjective  - Doing OK   Objective 108/77 92 98.3 F (36.8 C) (Oral) 18 100%  Intake/Output Summary (Last 24 hours) at 03/27/2020 0726 Last data filed at 03/26/2020 2215 Gross per 24 hour  Intake 480 ml  Output 1700 ml  Net -1220 ml    Right LE edema in calf known DVT.  Minimal bloody drainage at medial thigh vein harvest site. Surrounding firmness with hematoma, groin incision healing well Doppler signals intact right DP/PT Lungs non labored breathing  EXAM: CHEST - 2 VIEW  COMPARISON:  Chest x-ray dated 03/15/2020  FINDINGS: Heart size and mediastinal contours are within normal limits. Lungs are clear. Lateral view shows questionable trace pleural effusion within the posterior costophrenic space. Osseous structures about the chest are unremarkable.  IMPRESSION: 1. No evidence of pneumonia or pulmonary edema. 2. Questionable trace pleural effusion, as seen on lateral view only.   Assessment/Planning: POD # 12 70 y.o.maleis s/p right fem to BK pop bypass with PTFE for RLE ischemia with rest pain  Pending CBC for WBC and HGB, blood culture for leukocytosis of 18 03/26/20. On xarelto for DVT right  femoral vein, right posterior tibial veins, and right peroneal veins. Starter pack Xarelto was sent to transition of care, xarelto maintenance  dose refills sent to [facility] on [address]. He has PTFE bypass, if blood cultures are negative he will be OK for discharge today. He remains afebrile and bloody drainage has dissipated.      Roxy Horseman 03/27/2020 7:26 AM --  Laboratory Lab Results: Recent Labs    03/25/20 0128 03/26/20 0216  WBC 16.1* 18.0*  HGB 7.3* 7.8*  HCT 22.4* 23.7*  PLT 316 375   BMET Recent Labs    03/26/20 0216  NA 131*  K 3.9  CL 102  CO2 17*  GLUCOSE 113*  BUN 14  CREATININE 1.20  CALCIUM 8.1*    COAG Lab Results  Component Value Date   INR 1.1 03/08/2020   INR 1.1  06/11/2019   INR 1.0 08/07/2016   No results found for: PTT

## 2020-03-27 NOTE — Plan of Care (Signed)
  Problem: Education: Goal: Knowledge of General Education information will improve Description: Including pain rating scale, medication(s)/side effects and non-pharmacologic comfort measures Outcome: Adequate for Discharge   Problem: Health Behavior/Discharge Planning: Goal: Ability to manage health-related needs will improve Outcome: Adequate for Discharge   Problem: Clinical Measurements: Goal: Ability to maintain clinical measurements within normal limits will improve Outcome: Adequate for Discharge Goal: Will remain free from infection Outcome: Adequate for Discharge Goal: Diagnostic test results will improve Outcome: Adequate for Discharge Goal: Respiratory complications will improve Outcome: Adequate for Discharge Goal: Cardiovascular complication will be avoided Outcome: Adequate for Discharge   Problem: Activity: Goal: Risk for activity intolerance will decrease Outcome: Adequate for Discharge   Problem: Nutrition: Goal: Adequate nutrition will be maintained Outcome: Adequate for Discharge   Problem: Coping: Goal: Level of anxiety will decrease Outcome: Adequate for Discharge   Problem: Elimination: Goal: Will not experience complications related to bowel motility Outcome: Adequate for Discharge Goal: Will not experience complications related to urinary retention Outcome: Adequate for Discharge   Problem: Pain Managment: Goal: General experience of comfort will improve Outcome: Adequate for Discharge   Problem: Safety: Goal: Ability to remain free from injury will improve Outcome: Adequate for Discharge   Problem: Skin Integrity: Goal: Risk for impaired skin integrity will decrease Outcome: Adequate for Discharge   Problem: Acute Rehab OT Goals (only OT should resolve) Goal: Pt. Will Perform Grooming Outcome: Adequate for Discharge Goal: Pt. Will Perform Lower Body Bathing Outcome: Adequate for Discharge Goal: Pt. Will Perform Lower Body  Dressing Outcome: Adequate for Discharge Goal: Pt. Will Transfer To Toilet Outcome: Adequate for Discharge   Problem: Acute Rehab PT Goals(only PT should resolve) Goal: Pt Will Go Supine/Side To Sit Outcome: Adequate for Discharge Goal: Patient Will Transfer Sit To/From Stand Outcome: Adequate for Discharge Goal: Pt Will Transfer Bed To Chair/Chair To Bed Outcome: Adequate for Discharge Goal: Pt Will Ambulate Outcome: Adequate for Discharge Goal: Pt Will Go Up/Down Stairs Outcome: Adequate for Discharge

## 2020-03-28 ENCOUNTER — Telehealth: Payer: Self-pay | Admitting: Family Medicine

## 2020-03-28 DIAGNOSIS — Z7982 Long term (current) use of aspirin: Secondary | ICD-10-CM | POA: Diagnosis not present

## 2020-03-28 DIAGNOSIS — I82411 Acute embolism and thrombosis of right femoral vein: Secondary | ICD-10-CM | POA: Diagnosis not present

## 2020-03-28 DIAGNOSIS — K759 Inflammatory liver disease, unspecified: Secondary | ICD-10-CM | POA: Diagnosis not present

## 2020-03-28 DIAGNOSIS — Z48812 Encounter for surgical aftercare following surgery on the circulatory system: Secondary | ICD-10-CM | POA: Diagnosis not present

## 2020-03-28 DIAGNOSIS — G40909 Epilepsy, unspecified, not intractable, without status epilepticus: Secondary | ICD-10-CM | POA: Diagnosis not present

## 2020-03-28 DIAGNOSIS — Z7901 Long term (current) use of anticoagulants: Secondary | ICD-10-CM | POA: Diagnosis not present

## 2020-03-28 DIAGNOSIS — Z95820 Peripheral vascular angioplasty status with implants and grafts: Secondary | ICD-10-CM | POA: Diagnosis not present

## 2020-03-28 DIAGNOSIS — M1990 Unspecified osteoarthritis, unspecified site: Secondary | ICD-10-CM | POA: Diagnosis not present

## 2020-03-28 DIAGNOSIS — I739 Peripheral vascular disease, unspecified: Secondary | ICD-10-CM | POA: Diagnosis not present

## 2020-03-28 DIAGNOSIS — Z87891 Personal history of nicotine dependence: Secondary | ICD-10-CM | POA: Diagnosis not present

## 2020-03-28 DIAGNOSIS — Z7902 Long term (current) use of antithrombotics/antiplatelets: Secondary | ICD-10-CM | POA: Diagnosis not present

## 2020-03-28 DIAGNOSIS — Z9181 History of falling: Secondary | ICD-10-CM | POA: Diagnosis not present

## 2020-03-28 DIAGNOSIS — I10 Essential (primary) hypertension: Secondary | ICD-10-CM | POA: Diagnosis not present

## 2020-03-28 NOTE — Telephone Encounter (Signed)
Jeffrey Serrano  from [place] needing a verbal on PT 2 times for 2 weeks and One time a week for 1 week       Call [phone]

## 2020-03-29 NOTE — Telephone Encounter (Signed)
Verbal given 

## 2020-03-31 LAB — CULTURE, BLOOD (SINGLE)
Culture: NO GROWTH
Special Requests: ADEQUATE

## 2020-04-07 ENCOUNTER — Encounter: Payer: PPO | Admitting: Vascular Surgery

## 2020-04-07 ENCOUNTER — Telehealth: Payer: Self-pay | Admitting: Family Medicine

## 2020-04-07 NOTE — Telephone Encounter (Signed)
Death certificate dropped off to be completed by provider placed in provider box for signature    Please call [facility]  [phone]

## 2020-04-07 NOTE — Telephone Encounter (Signed)
Pt death certificate in your "to be signed" box please let me know when done and I will reach out to the [place].  Thank you.

## 2020-04-10 NOTE — Telephone Encounter (Signed)
Phone call placed to Jeanmarc's brother, Pieter Partridge.  Condolences given on the passing of Kinan.  Denied any acute needs at this time and does have support system with his family in town.  Funeral for Mr. Grays tomorrow.  Advised to let me know if I can be of any assistance during this difficult time.  Additionally he provided more information about Lorance's passing.  Had 2 seizures on Saturday afternoon January 15.  Noted to have black stool that day with multiple bowel movements that day -went to restroom 5 times.  He was able to eat some that night with the assistance of his brother.  Went to bed that evening, and then the next morning when his brother Pieter Partridge checked on him at 9 AM, his extremities were cold, and he was unresponsive.  He was noted to be found with his head on his pillow, no signs of seizure during the night.  EMS arrived and was advised that he passed of natural causes.  History of seizure disorder, but had not experienced a seizure recently prior to 2 on  Saturday the 15th. Hospital discharge January 10th after right femoral to below-knee popliteal bypass graft.  Right fem-peroneal vein DVT noted on January 3, started on heparin then transition to Xarelto at discharge.  Received 1 unit packed red blood cells for anemia in hospital. Hemoglobin 9.2 on 03/27/20. Discharged on Plavix, aspirin, statin, Xarelto.  Home health PT.   With black stool on January 15, possible gastrointestinal bleed as contributor to cause of death, with DVT, peripheral arterial disease, seizure disorder as potential contributors. Death certificate completed.

## 2020-04-11 ENCOUNTER — Telehealth: Payer: Self-pay | Admitting: Adult Health

## 2020-04-11 ENCOUNTER — Telehealth: Payer: Self-pay | Admitting: Family Medicine

## 2020-04-11 NOTE — Telephone Encounter (Signed)
Called back and informed that today 04/11/20 is your admin day, so you are not in office but that you are aware and reviewing his information. They requested we get this signed soon as we can as this is the last thing they need before they may proceed with his cremation. I told him we understood and would call him as soon as it is ready for pick up. Thank you!

## 2020-04-11 NOTE — Telephone Encounter (Signed)
Pt's brother called to inform office that the pt has passed away last weekend.

## 2020-04-11 NOTE — Telephone Encounter (Signed)
Completed last night and ready for pickup. D/w Noah Delaine who has advised facility.

## 2020-04-11 NOTE — Telephone Encounter (Signed)
Sympathy card to your desk.

## 2020-04-17 ENCOUNTER — Ambulatory Visit: Payer: PPO | Admitting: Adult Health

## 2020-04-18 DEATH — deceased

## 2020-04-25 ENCOUNTER — Other Ambulatory Visit: Payer: Self-pay | Admitting: Adult Health

## 2020-05-12 ENCOUNTER — Ambulatory Visit: Payer: PPO | Admitting: Podiatry

## 2020-06-20 ENCOUNTER — Ambulatory Visit: Payer: PPO | Admitting: Internal Medicine

## 2020-07-07 ENCOUNTER — Ambulatory Visit: Payer: PPO | Admitting: Family Medicine

## 2022-09-07 IMAGING — DX DG CHEST 1V
1 series · 1 of 1 positions shown · non-contrast
Comparison: Chest radiograph 05/10/2013

CLINICAL DATA: Central line placement.

EXAM:
CHEST  1 VIEW

[chest]
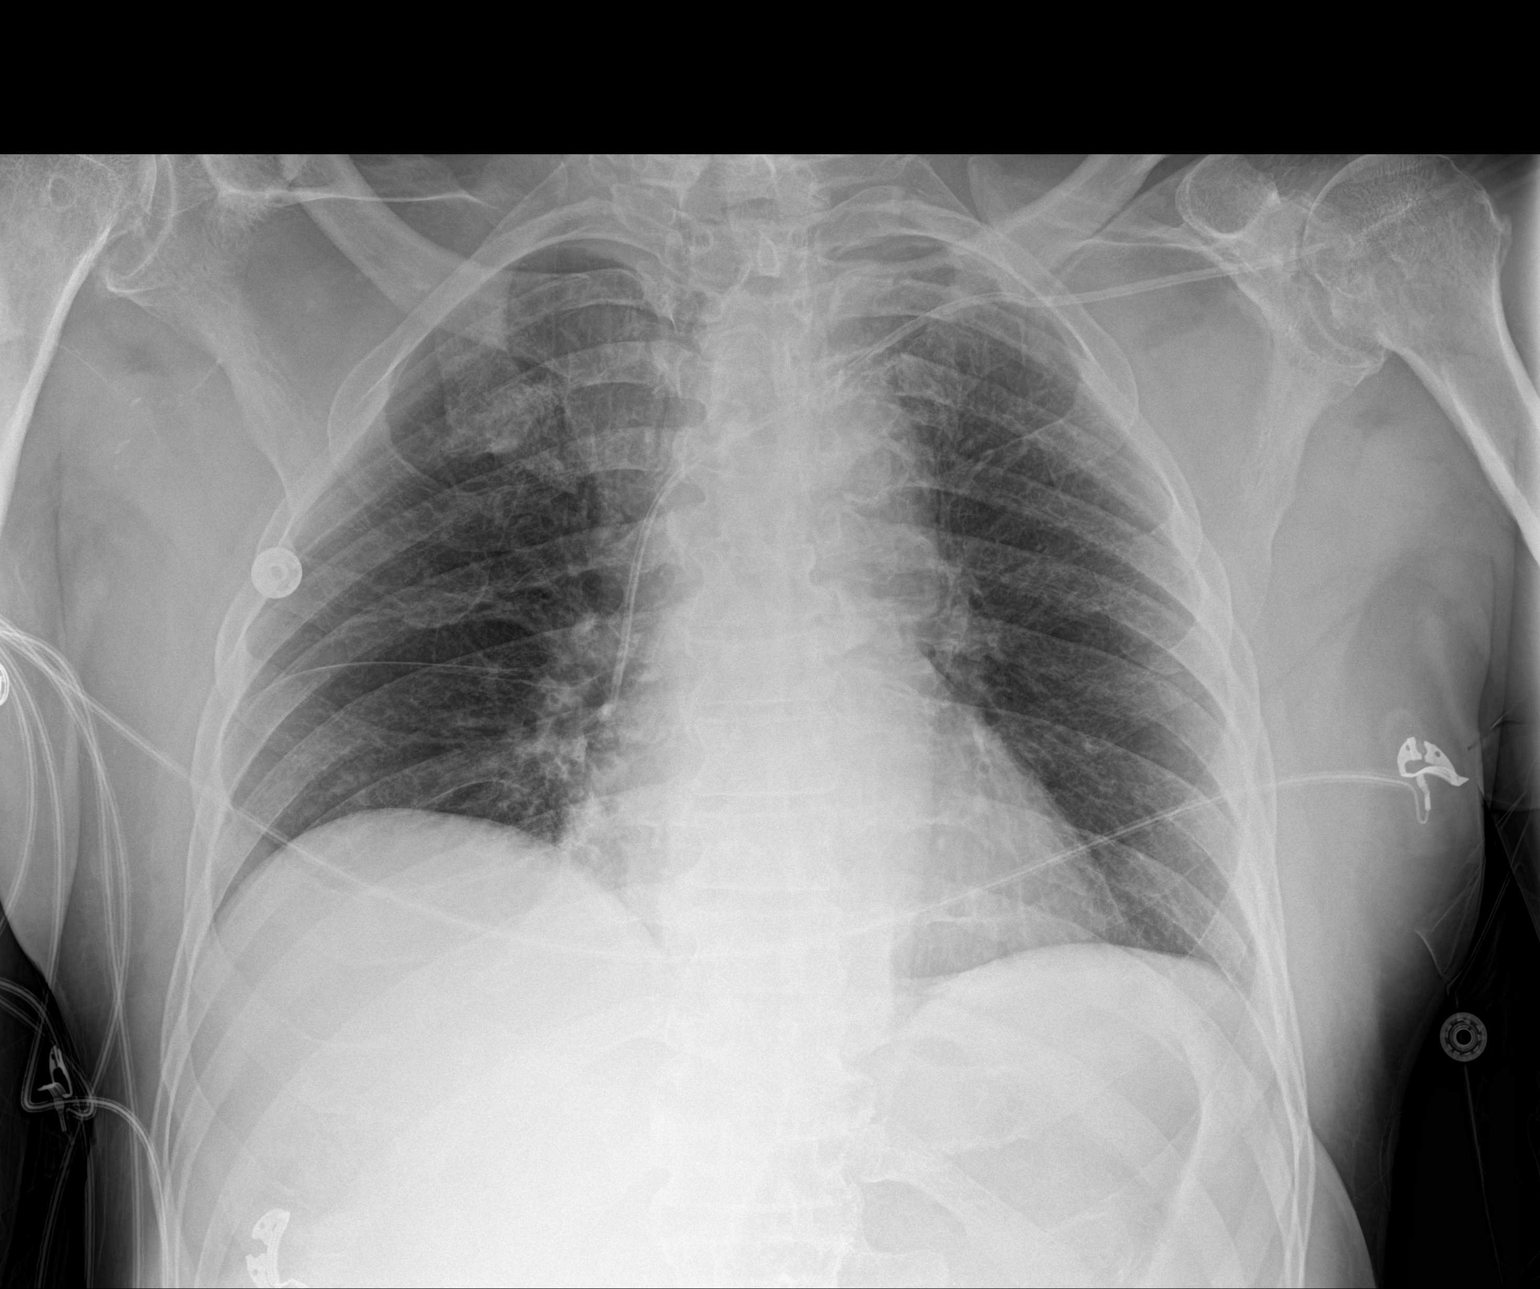

[1 of 1 positions shown; findings below may reference images not displayed]

FINDINGS: Left subclavian central venous catheter tip in the region of the mid
lower SVC. No pneumothorax. Lung volumes are low. Stable heart size
and mediastinal contours. Mild perivascular haziness. No significant
pleural effusion. No confluent consolidation. No acute osseous
abnormalities are seen. Superior subluxation of the right humeral
head suggest underlying rotator cuff arthropathy.
IMPRESSION: 1. Left subclavian central venous catheter tip in the mid lower SVC.
No pneumothorax.
2. Low lung volumes with mild perivascular haziness, likely vascular
congestion.
